# Patient Record
Sex: Male | Born: 1965 | Race: Black or African American | Hispanic: No | Marital: Single | Smoking: Current every day smoker
Health system: Southern US, Community
[De-identification: ages and names within clinical notes are randomized; demographics above are authoritative.]

## PROBLEM LIST (undated history)

## (undated) DIAGNOSIS — F209 Schizophrenia, unspecified: Secondary | ICD-10-CM

---

## 2004-11-26 ENCOUNTER — Emergency Department (HOSPITAL_COMMUNITY): Admission: EM | Admit: 2004-11-26 | Discharge: 2004-11-26 | Payer: Self-pay | Admitting: Emergency Medicine

## 2006-12-21 ENCOUNTER — Emergency Department (HOSPITAL_COMMUNITY): Admission: EM | Admit: 2006-12-21 | Discharge: 2006-12-21 | Payer: Self-pay | Admitting: Emergency Medicine

## 2009-07-17 ENCOUNTER — Emergency Department (HOSPITAL_BASED_OUTPATIENT_CLINIC_OR_DEPARTMENT_OTHER): Admission: EM | Admit: 2009-07-17 | Discharge: 2009-07-17 | Payer: Self-pay | Admitting: Emergency Medicine

## 2009-12-30 ENCOUNTER — Emergency Department (HOSPITAL_COMMUNITY): Admission: EM | Admit: 2009-12-30 | Discharge: 2009-12-31 | Payer: Self-pay | Admitting: Emergency Medicine

## 2010-01-02 ENCOUNTER — Emergency Department (HOSPITAL_BASED_OUTPATIENT_CLINIC_OR_DEPARTMENT_OTHER): Admission: EM | Admit: 2010-01-02 | Discharge: 2010-01-02 | Payer: Self-pay | Admitting: Emergency Medicine

## 2010-04-29 LAB — DIFFERENTIAL
Basophils Relative: 1 % (ref 0–1)
Eosinophils Absolute: 0.6 10*3/uL (ref 0.0–0.7)
Eosinophils Relative: 6 % — ABNORMAL HIGH (ref 0–5)
Lymphocytes Relative: 26 % (ref 12–46)
Monocytes Absolute: 0.7 10*3/uL (ref 0.1–1.0)
Monocytes Relative: 10 % (ref 3–12)
Monocytes Relative: 8 % (ref 3–12)
Neutro Abs: 4.3 10*3/uL (ref 1.7–7.7)
Neutrophils Relative %: 64 % (ref 43–77)

## 2010-04-29 LAB — POCT TOXICOLOGY PANEL

## 2010-04-29 LAB — ETHANOL: Alcohol, Ethyl (B): <5 mg/dL (ref 0–10)

## 2010-04-29 LAB — RAPID URINE DRUG SCREEN, HOSP PERFORMED
Amphetamines: NOT DETECTED
Barbiturates: NOT DETECTED
Cocaine: NOT DETECTED
Opiates: NOT DETECTED
Tetrahydrocannabinol: NOT DETECTED

## 2010-04-29 LAB — CBC
HCT: 36.6 % — ABNORMAL LOW (ref 39.0–52.0)
Hemoglobin: 12.6 g/dL — ABNORMAL LOW (ref 13.0–17.0)
Hemoglobin: 13.4 g/dL (ref 13.0–17.0)
MCH: 31.5 pg (ref 26.0–34.0)
MCHC: 34.5 g/dL (ref 30.0–36.0)
MCV: 91.5 fL (ref 78.0–100.0)
RBC: 4.01 MIL/uL — ABNORMAL LOW (ref 4.22–5.81)
WBC: 7.6 10*3/uL (ref 4.0–10.5)

## 2010-04-29 LAB — BASIC METABOLIC PANEL
CO2: 26 mEq/L (ref 19–32)
Calcium: 9.2 mg/dL (ref 8.4–10.5)
Chloride: 106 mEq/L (ref 96–112)
Creatinine, Ser: 0.8 mg/dL (ref 0.4–1.5)
GFR calc Af Amer: >60 mL/min (ref >60)
GFR calc non Af Amer: >60 mL/min (ref >60)
Glucose, Bld: 106 mg/dL — ABNORMAL HIGH (ref 70–99)
Potassium: 3.4 mEq/L — ABNORMAL LOW (ref 3.5–5.1)
Sodium: 139 mEq/L (ref 135–145)
Sodium: 144 mEq/L (ref 135–145)

## 2010-04-29 LAB — URINALYSIS, ROUTINE W REFLEX MICROSCOPIC
Bilirubin Urine: NEGATIVE
Glucose, UA: NEGATIVE mg/dL
Hgb urine dipstick: NEGATIVE
Ketones, ur: NEGATIVE mg/dL
Protein, ur: NEGATIVE mg/dL
Urobilinogen, UA: 1 mg/dL (ref 0.0–1.0)

## 2010-04-29 LAB — CLOZAPINE (CLOZARIL): Clozapine Lvl: 53 ng/mL

## 2010-04-29 LAB — ACETAMINOPHEN LEVEL: Acetaminophen (Tylenol), Serum: <10 ug/mL — ABNORMAL LOW (ref 10–30)

## 2010-05-05 LAB — URINALYSIS, ROUTINE W REFLEX MICROSCOPIC
Glucose, UA: NEGATIVE mg/dL
Nitrite: NEGATIVE
Specific Gravity, Urine: 1.033 — ABNORMAL HIGH (ref 1.005–1.030)
pH: 6 (ref 5.0–8.0)

## 2010-05-05 LAB — DIFFERENTIAL
Basophils Absolute: 0 10*3/uL (ref 0.0–0.1)
Eosinophils Relative: 7 % — ABNORMAL HIGH (ref 0–5)
Lymphocytes Relative: 32 % (ref 12–46)
Lymphs Abs: 2.3 10*3/uL (ref 0.7–4.0)
Monocytes Absolute: 0.4 10*3/uL (ref 0.1–1.0)
Neutro Abs: 4 10*3/uL (ref 1.7–7.7)

## 2010-05-05 LAB — COMPREHENSIVE METABOLIC PANEL
AST: 30 U/L (ref 0–37)
Albumin: 4 g/dL (ref 3.5–5.2)
BUN: 12 mg/dL (ref 6–23)
Calcium: 9.4 mg/dL (ref 8.4–10.5)
Chloride: 106 mEq/L (ref 96–112)
Creatinine, Ser: 0.8 mg/dL (ref 0.4–1.5)
GFR calc Af Amer: >60 mL/min (ref >60)
GFR calc non Af Amer: >60 mL/min (ref >60)
Total Bilirubin: 0.7 mg/dL (ref 0.3–1.2)

## 2010-05-05 LAB — POCT TOXICOLOGY PANEL

## 2010-05-05 LAB — CBC
HCT: 40.8 % (ref 39.0–52.0)
MCHC: 32.8 g/dL (ref 30.0–36.0)
MCV: 92.3 fL (ref 78.0–100.0)
Platelets: 280 10*3/uL (ref 150–400)

## 2010-08-14 ENCOUNTER — Emergency Department (HOSPITAL_COMMUNITY)
Admission: EM | Admit: 2010-08-14 | Discharge: 2010-08-14 | Disposition: A | Discharge status: Home / Self Care | Payer: Medicaid Other | Attending: Emergency Medicine | Admitting: Emergency Medicine

## 2010-08-14 DIAGNOSIS — I10 Essential (primary) hypertension: Secondary | ICD-10-CM | POA: Insufficient documentation

## 2010-08-14 DIAGNOSIS — Z87820 Personal history of traumatic brain injury: Secondary | ICD-10-CM | POA: Insufficient documentation

## 2010-08-14 DIAGNOSIS — F411 Generalized anxiety disorder: Secondary | ICD-10-CM | POA: Insufficient documentation

## 2010-11-11 ENCOUNTER — Emergency Department (HOSPITAL_COMMUNITY)
Admission: EM | Admit: 2010-11-11 | Discharge: 2010-11-11 | Disposition: A | Discharge status: Other Acute Inpatient Hospital | Payer: Medicaid Other | Attending: Emergency Medicine | Admitting: Emergency Medicine

## 2010-11-11 DIAGNOSIS — F259 Schizoaffective disorder, unspecified: Secondary | ICD-10-CM | POA: Insufficient documentation

## 2010-11-11 DIAGNOSIS — F489 Nonpsychotic mental disorder, unspecified: Secondary | ICD-10-CM | POA: Insufficient documentation

## 2010-11-11 DIAGNOSIS — I10 Essential (primary) hypertension: Secondary | ICD-10-CM | POA: Insufficient documentation

## 2010-11-11 DIAGNOSIS — Z87820 Personal history of traumatic brain injury: Secondary | ICD-10-CM | POA: Insufficient documentation

## 2010-11-11 DIAGNOSIS — R45850 Homicidal ideations: Secondary | ICD-10-CM | POA: Insufficient documentation

## 2010-11-11 LAB — COMPREHENSIVE METABOLIC PANEL
ALT: 22 U/L (ref 0–53)
AST: 25 U/L (ref 0–37)
Albumin: 4 g/dL (ref 3.5–5.2)
Chloride: 100 mEq/L (ref 96–112)
Creatinine, Ser: 0.57 mg/dL (ref 0.50–1.35)
Sodium: 134 mEq/L — ABNORMAL LOW (ref 135–145)
Total Bilirubin: 0.4 mg/dL (ref 0.3–1.2)

## 2010-11-11 LAB — DIFFERENTIAL
Basophils Absolute: 0 10*3/uL (ref 0.0–0.1)
Eosinophils Relative: 5 % (ref 0–5)
Lymphocytes Relative: 23 % (ref 12–46)
Lymphs Abs: 2 10*3/uL (ref 0.7–4.0)
Monocytes Absolute: 0.8 10*3/uL (ref 0.1–1.0)
Monocytes Relative: 8 % (ref 3–12)
Neutro Abs: 5.7 10*3/uL (ref 1.7–7.7)

## 2010-11-11 LAB — CBC
HCT: 41.1 % (ref 39.0–52.0)
Hemoglobin: 14.4 g/dL (ref 13.0–17.0)
MCH: 31.2 pg (ref 26.0–34.0)
MCHC: 35 g/dL (ref 30.0–36.0)
MCV: 89.2 fL (ref 78.0–100.0)

## 2010-11-11 LAB — ETHANOL: Alcohol, Ethyl (B): <11 mg/dL (ref 0–11)

## 2010-11-11 LAB — RAPID URINE DRUG SCREEN, HOSP PERFORMED
Barbiturates: NOT DETECTED
Cocaine: NOT DETECTED
Tetrahydrocannabinol: POSITIVE — AB

## 2010-11-25 LAB — COMPREHENSIVE METABOLIC PANEL
ALT: 46
AST: 77 — ABNORMAL HIGH
Alkaline Phosphatase: 75
CO2: 27
Chloride: 102
Creatinine, Ser: 0.71
GFR calc Af Amer: >60
GFR calc non Af Amer: >60
Total Bilirubin: 0.9

## 2010-11-25 LAB — URINALYSIS, ROUTINE W REFLEX MICROSCOPIC
Nitrite: NEGATIVE
Specific Gravity, Urine: 1.042 — ABNORMAL HIGH
Urobilinogen, UA: 1

## 2010-11-25 LAB — DIFFERENTIAL
Basophils Absolute: 0
Basophils Relative: 0
Eosinophils Absolute: 0.3
Eosinophils Relative: 3

## 2010-11-25 LAB — CBC
MCV: 89.6
RBC: 3.83 — ABNORMAL LOW
WBC: 10

## 2010-11-25 LAB — URINE MICROSCOPIC-ADD ON: Urine-Other: NONE SEEN

## 2010-11-25 LAB — RAPID URINE DRUG SCREEN, HOSP PERFORMED
Amphetamines: NOT DETECTED
Barbiturates: NOT DETECTED

## 2011-12-23 ENCOUNTER — Emergency Department (HOSPITAL_COMMUNITY)
Admission: EM | Admit: 2011-12-23 | Discharge: 2011-12-23 | Discharge status: Against Medical Advice | Payer: Self-pay | Attending: Emergency Medicine | Admitting: Emergency Medicine

## 2011-12-23 DIAGNOSIS — Z7689 Persons encountering health services in other specified circumstances: Secondary | ICD-10-CM | POA: Insufficient documentation

## 2011-12-23 NOTE — ED Notes (Signed)
Patient called for triage. No answer. Patient not in waiting room or outside.

## 2011-12-23 NOTE — ED Notes (Signed)
Patient called for triage. No answer. Patient not in waiting room and not found outside.

## 2011-12-23 NOTE — ED Notes (Signed)
Patient called for triage. No answer. Patient not found in waiting area or outside.

## 2012-03-24 ENCOUNTER — Emergency Department (HOSPITAL_COMMUNITY)
Admission: EM | Admit: 2012-03-24 | Discharge: 2012-03-24 | Disposition: A | Discharge status: Home / Self Care | Payer: Medicaid Other | Attending: Emergency Medicine | Admitting: Emergency Medicine

## 2012-03-24 ENCOUNTER — Emergency Department (HOSPITAL_COMMUNITY): Payer: Medicaid Other

## 2012-03-24 DIAGNOSIS — M795 Residual foreign body in soft tissue: Secondary | ICD-10-CM

## 2012-03-24 DIAGNOSIS — S0101XA Laceration without foreign body of scalp, initial encounter: Secondary | ICD-10-CM

## 2012-03-24 DIAGNOSIS — R799 Abnormal finding of blood chemistry, unspecified: Secondary | ICD-10-CM | POA: Insufficient documentation

## 2012-03-24 DIAGNOSIS — S0100XA Unspecified open wound of scalp, initial encounter: Secondary | ICD-10-CM

## 2012-03-24 DIAGNOSIS — F29 Unspecified psychosis not due to a substance or known physiological condition: Secondary | ICD-10-CM | POA: Insufficient documentation

## 2012-03-24 LAB — CBC
HCT: 40.5 % (ref 39.0–52.0)
Hemoglobin: 13.5 g/dL (ref 13.0–17.0)
MCH: 29.5 pg (ref 26.0–34.0)
MCV: 88.6 fL (ref 78.0–100.0)
Platelets: 163 10*3/uL (ref 150–400)
RBC: 4.57 MIL/uL (ref 4.22–5.81)
WBC: 12.4 10*3/uL — ABNORMAL HIGH (ref 4.0–10.5)

## 2012-03-24 LAB — ABO/RH: ABO/RH(D): O NEG

## 2012-03-24 LAB — POCT I-STAT, CHEM 8
BUN: 11 mg/dL (ref 6–23)
Chloride: 106 mEq/L (ref 96–112)
Creatinine, Ser: 1 mg/dL (ref 0.50–1.35)
Glucose, Bld: 130 mg/dL — ABNORMAL HIGH (ref 70–99)
Potassium: 4.7 mEq/L (ref 3.5–5.1)
Sodium: 144 mEq/L (ref 135–145)

## 2012-03-24 LAB — COMPREHENSIVE METABOLIC PANEL
AST: 49 U/L — ABNORMAL HIGH (ref 0–37)
Albumin: 3.7 g/dL (ref 3.5–5.2)
Chloride: 104 mEq/L (ref 96–112)
Creatinine, Ser: 0.8 mg/dL (ref 0.50–1.35)
Potassium: 4.8 mEq/L (ref 3.5–5.1)
Total Bilirubin: 0.2 mg/dL — ABNORMAL LOW (ref 0.3–1.2)
Total Protein: 7.1 g/dL (ref 6.0–8.3)

## 2012-03-24 LAB — CDS SEROLOGY

## 2012-03-24 MED ORDER — FENTANYL CITRATE 0.05 MG/ML IJ SOLN
INTRAMUSCULAR | Status: AC | PRN
  Administered 2012-03-24: 50 ug via INTRAVENOUS

## 2012-03-24 MED ORDER — CEFAZOLIN SODIUM-DEXTROSE 2-3 GM-% IV SOLR: 2.0000 g | Freq: Once | INTRAVENOUS | Status: DC

## 2012-03-24 MED ORDER — CEFAZOLIN SODIUM 1 G IJ SOLR
2.0000 g | Freq: Once | INTRAMUSCULAR | Status: AC
  Administered 2012-03-24: 2 g via INTRAMUSCULAR
  Filled 2012-03-24 (×2): qty 20
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
## 2012-03-24 NOTE — ED Notes (Signed)
PT transported to CT with Nehemiah Settle, Charity fundraiser
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
## 2012-03-24 NOTE — ED Notes (Signed)
Pt shouting and cursing at staff and [employer]. [employer] at bedside. Pts brother called at pt request by SW. Brother will pick pt up for d/c.
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
## 2012-03-24 NOTE — ED Notes (Signed)
Pt removed IV to Rt AC. Bleeding controlled

## 2012-03-24 NOTE — ED Notes (Signed)
Pt removed all monitors. Refuses to allow RN to replace and obtain VS. MD notified. Trauma signing off, ED-P will resume care.
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
## 2012-03-24 NOTE — ED Notes (Signed)
Lactic acid results shown to Dr. Preston Fleeting

## 2012-03-24 NOTE — Consult Note (Signed)
Patient examined and I agree with the assessment and plan  Violeta Gelinas, MD, MPH, FACS Pager: [phone]  03/24/2012 3:56 PM
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
## 2012-03-24 NOTE — ED Notes (Signed)
Pt refused to have VS taken at time of d/c.

## 2012-03-25 LAB — TYPE AND SCREEN
ABO/RH(D): O NEG
Unit division: 0

## 2012-03-26 ENCOUNTER — Emergency Department (HOSPITAL_COMMUNITY)
Admission: EM | Admit: 2012-03-26 | Discharge: 2012-03-28 | Disposition: A | Discharge status: Home / Self Care | Payer: Medicaid Other | Source: Home / Self Care | Attending: Emergency Medicine | Admitting: Emergency Medicine

## 2012-03-26 ENCOUNTER — Emergency Department (HOSPITAL_COMMUNITY): Payer: Medicaid Other

## 2012-03-26 ENCOUNTER — Encounter (HOSPITAL_COMMUNITY): Payer: Self-pay | Admitting: *Deleted

## 2012-03-26 DIAGNOSIS — F17200 Nicotine dependence, unspecified, uncomplicated: Secondary | ICD-10-CM | POA: Insufficient documentation

## 2012-03-26 DIAGNOSIS — F209 Schizophrenia, unspecified: Secondary | ICD-10-CM

## 2012-03-26 DIAGNOSIS — Z9119 Patient's noncompliance with other medical treatment and regimen: Secondary | ICD-10-CM

## 2012-03-26 DIAGNOSIS — Z91199 Patient's noncompliance with other medical treatment and regimen due to unspecified reason: Secondary | ICD-10-CM

## 2012-03-26 DIAGNOSIS — F1410 Cocaine abuse, uncomplicated: Secondary | ICD-10-CM | POA: Diagnosis present

## 2012-03-26 DIAGNOSIS — Z79899 Other long term (current) drug therapy: Secondary | ICD-10-CM | POA: Insufficient documentation

## 2012-03-26 HISTORY — DX: Schizophrenia, unspecified: F20.9

## 2012-03-26 LAB — COMPREHENSIVE METABOLIC PANEL
ALT: 44 U/L (ref 0–53)
AST: 67 U/L — ABNORMAL HIGH (ref 0–37)
Albumin: 3.7 g/dL (ref 3.5–5.2)
CO2: 26 mEq/L (ref 19–32)
Calcium: 9.1 mg/dL (ref 8.4–10.5)
Chloride: 103 mEq/L (ref 96–112)
GFR calc non Af Amer: >90 mL/min (ref >90)
Sodium: 138 mEq/L (ref 135–145)
Total Bilirubin: 0.2 mg/dL — ABNORMAL LOW (ref 0.3–1.2)

## 2012-03-26 LAB — CBC WITH DIFFERENTIAL/PLATELET
Basophils Absolute: 0 10*3/uL (ref 0.0–0.1)
Basophils Relative: 0 % (ref 0–1)
Lymphocytes Relative: 25 % (ref 12–46)
Neutro Abs: 7.1 10*3/uL (ref 1.7–7.7)
Platelets: 263 10*3/uL (ref 150–400)
RDW: 14.8 % (ref 11.5–15.5)
WBC: 11.2 10*3/uL — ABNORMAL HIGH (ref 4.0–10.5)

## 2012-03-26 LAB — ETHANOL: Alcohol, Ethyl (B): <11 mg/dL (ref 0–11)

## 2012-03-26 MED ORDER — ZIPRASIDONE MESYLATE 20 MG IM SOLR
20.0000 mg | Freq: Once | INTRAMUSCULAR | Status: AC
  Administered 2012-03-26: 20 mg via INTRAMUSCULAR
  Filled 2012-03-26: qty 20

## 2012-03-26 MED ORDER — HALOPERIDOL LACTATE 5 MG/ML IJ SOLN
INTRAMUSCULAR | Status: AC
  Filled 2012-03-26: qty 1

## 2012-03-26 MED ORDER — LORAZEPAM 2 MG/ML IJ SOLN
2.0000 mg | Freq: Once | INTRAMUSCULAR | Status: AC
  Administered 2012-03-26: 2 mg via INTRAVENOUS

## 2012-03-26 MED ORDER — ACETAMINOPHEN 325 MG PO TABS: 650.0000 mg | ORAL_TABLET | ORAL | Status: DC | PRN

## 2012-03-26 MED ORDER — LORAZEPAM 2 MG/ML IJ SOLN
INTRAMUSCULAR | Status: AC
  Administered 2012-03-26: 2 mg via INTRAVENOUS
  Filled 2012-03-26: qty 1

## 2012-03-26 MED ORDER — IBUPROFEN 600 MG PO TABS: 600.0000 mg | ORAL_TABLET | Freq: Three times a day (TID) | ORAL | Status: DC | PRN

## 2012-03-26 MED ORDER — LORAZEPAM 1 MG PO TABS
1.0000 mg | ORAL_TABLET | Freq: Three times a day (TID) | ORAL | Status: DC | PRN
  Administered 2012-03-27 – 2012-03-28 (×3): 1 mg via ORAL
  Filled 2012-03-26 (×3): qty 1

## 2012-03-26 MED ORDER — ONDANSETRON HCL 4 MG PO TABS: 4.0000 mg | ORAL_TABLET | Freq: Three times a day (TID) | ORAL | Status: DC | PRN

## 2012-03-26 NOTE — ED Notes (Signed)
Patient transported to CT 

## 2012-03-26 NOTE — ED Notes (Signed)
Pt much calmer at this time. Pt resting quietly. Respirations even and unlabored.

## 2012-03-26 NOTE — ED Notes (Signed)
Pt extremely agitated and having flight of ideas and word salad. Pt shaking his fist, yelling, and swinging his arms. [employer] and security at bedside. IV ativan given per Dr. Wonda Amis order. Pt placed on monitor.
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
## 2012-03-26 NOTE — ED Notes (Signed)
Dr. Bevely Palmer aware and will be in to evaluate pt. To do IVC.
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
## 2012-03-27 LAB — TYPE AND SCREEN: Antibody Screen: NEGATIVE

## 2012-03-27 LAB — URINALYSIS, ROUTINE W REFLEX MICROSCOPIC
Bilirubin Urine: NEGATIVE
Glucose, UA: NEGATIVE mg/dL
Hgb urine dipstick: NEGATIVE
Specific Gravity, Urine: 1.024 (ref 1.005–1.030)
Urobilinogen, UA: 1 mg/dL (ref 0.0–1.0)

## 2012-03-27 LAB — CBC
HCT: 28.3 % — ABNORMAL LOW (ref 39.0–52.0)
Hemoglobin: 9.7 g/dL — ABNORMAL LOW (ref 13.0–17.0)
MCHC: 34.3 g/dL (ref 30.0–36.0)
RBC: 3.23 MIL/uL — ABNORMAL LOW (ref 4.22–5.81)
WBC: 8.6 10*3/uL (ref 4.0–10.5)

## 2012-03-27 LAB — RAPID URINE DRUG SCREEN, HOSP PERFORMED
Amphetamines: NOT DETECTED
Benzodiazepines: NOT DETECTED
Opiates: NOT DETECTED
Tetrahydrocannabinol: POSITIVE — AB

## 2012-03-27 LAB — PROTIME-INR: INR: 1.03 (ref 0.00–1.49)

## 2012-03-27 MED ORDER — DIVALPROEX SODIUM 500 MG PO DR TAB
500.0000 mg | DELAYED_RELEASE_TABLET | Freq: Two times a day (BID) | ORAL | Status: DC
  Administered 2012-03-27 – 2012-03-28 (×3): 500 mg via ORAL
  Filled 2012-03-27 (×3): qty 1

## 2012-03-27 MED ORDER — RISPERIDONE 2 MG PO TABS
2.0000 mg | ORAL_TABLET | Freq: Two times a day (BID) | ORAL | Status: DC
  Filled 2012-03-27: qty 1

## 2012-03-27 NOTE — ED Notes (Signed)
This Clinical research associate spoke w/ the patients brother Daisey Must [phone] (at the pt's request and with his permission).  The brother reports that [facility] manage the pt's medications and that  he was suppose to receive this weeks medications on Monday but did not get them because he was at the hospital, so he has not been taking them this week.  The brother did not know what the patients medications are.
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
## 2012-03-27 NOTE — ED Notes (Signed)
Patient displaying sexually inappropriate behaviors as I was obtaining vital signs. Patient conversation with me was not inappropriate, just his behavior. RN Wille Celeste notified

## 2012-03-27 NOTE — ED Notes (Signed)
eatting lunch, pt is aware that telepsych will be done shortly

## 2012-03-27 NOTE — ED Provider Notes (Signed)
Patient had telemetry psychiatry consult for medication recommendations and those have been entered in the computer  [name], MD 03/27/12 [id number]

## 2012-03-27 NOTE — BH Assessment (Signed)
[facility] Assessment Progress Note   Consulted with Assunta Found, FNP regarding pt.  She called Dr Jeraldine Loots, EDP, to obtain further medical details.  Per [place], Dr Jeraldine Loots reported that no wound care would be necessary beyond keeping the wound clean and monitoring it for signs of infection.  Pt is to follow up with outpatient surgery; this being the case, there should be no further risk of re-injury from the foreign object remaining under the wound.  After consulting with Assunta Found, FNP it has been determined that pt would benefit from admission to [facility].  He is tentatively accepted pending availability of a bed on the 400 hall.  Currently none are available.  Called and spoke to Scherrie Merritts, LCSW, Assessment Counselor @ 18:30 to notify him.  Doylene Canning, MA Assessment Counselor 03/27/2012 @ 18:42

## 2012-03-27 NOTE — BH Assessment (Signed)
[facility] Assessment Progress Note   This clinician attempted to assess patient at 00:56.  Patient was easy to wake but would not maintain alertness.  Patient mumbles and is very difficult to understand.  Patient unable to answer basic questions at this time so another attempt to assess will be done before 07:00.  Spoke to Dr. Effie Shy and nurse Melody about holding off on telepsychiatry consult until patient is more alert and oriented.  Dr. Effie Shy said that was fine.

## 2012-03-27 NOTE — ED Notes (Signed)
AD contacted regarding need of sitter.

## 2012-03-27 NOTE — ED Provider Notes (Signed)
Patient transferred here from [facility] to await placement  [name], MD 03/27/12 [phone]

## 2012-03-27 NOTE — ED Notes (Signed)
Up to the bathroom 

## 2012-03-27 NOTE — ED Notes (Signed)
telepsych in progress 

## 2012-03-27 NOTE — ED Provider Notes (Signed)
Patient is stable for transfer to [facility] long psych ED.  BP 119/63  Pulse 89  Temp(Src) 98.3 F (36.8 C) (Oral)  Resp 18  SpO2 100%   Glynn Octave, MD 03/27/12 [phone]

## 2012-03-27 NOTE — ED Notes (Signed)
Up to the desk on the phone 

## 2012-03-27 NOTE — ED Notes (Signed)
Pt approached other nurse and asked her about activities in unit and then began talking to her about gangsters, sex and the internet.
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
## 2012-03-27 NOTE — ED Notes (Signed)
telepsych info faxed and called 

## 2012-03-27 NOTE — ED Notes (Signed)
Attempted to call [facility] for medication list, unable to get an answer.

## 2012-03-27 NOTE — ED Notes (Signed)
In to see pt.  Clothes and belonging removed from pt.  In process of doing inventory on belongings

## 2012-03-27 NOTE — ED Notes (Addendum)
ivc papers delivered by [facility] and placed on chart.  Berna Spare with act in to access the pt and states he feels at this time he is not able to speak with telepsych md.

## 2012-03-28 ENCOUNTER — Encounter (HOSPITAL_COMMUNITY): Payer: Self-pay | Admitting: Behavioral Health

## 2012-03-28 ENCOUNTER — Inpatient Hospital Stay (HOSPITAL_COMMUNITY)
Admission: EM | Admit: 2012-03-28 | Discharge: 2012-04-04 | DRG: 885 | Disposition: A | Discharge status: Home / Self Care | Payer: Medicaid Other | Source: Intra-hospital | Attending: Psychiatry | Admitting: Psychiatry

## 2012-03-28 DIAGNOSIS — F209 Schizophrenia, unspecified: Secondary | ICD-10-CM

## 2012-03-28 DIAGNOSIS — Z9119 Patient's noncompliance with other medical treatment and regimen: Secondary | ICD-10-CM

## 2012-03-28 DIAGNOSIS — F1910 Other psychoactive substance abuse, uncomplicated: Secondary | ICD-10-CM

## 2012-03-28 MED ORDER — ACETAMINOPHEN 325 MG PO TABS: 650.0000 mg | ORAL_TABLET | Freq: Four times a day (QID) | ORAL | Status: DC | PRN

## 2012-03-28 MED ORDER — LISINOPRIL 10 MG PO TABS
10.0000 mg | ORAL_TABLET | Freq: Every day | ORAL | Status: DC
  Administered 2012-03-31 – 2012-04-04 (×4): 10 mg via ORAL
  Filled 2012-03-28 (×2): qty 1
  Filled 2012-03-28: qty 14
  Filled 2012-03-28 (×7): qty 1

## 2012-03-28 MED ORDER — TRAZODONE HCL 50 MG PO TABS
50.0000 mg | ORAL_TABLET | Freq: Every day | ORAL | Status: DC
  Administered 2012-03-29 – 2012-04-03 (×6): 50 mg via ORAL
  Filled 2012-03-28: qty 1
  Filled 2012-03-28: qty 14
  Filled 2012-03-28 (×8): qty 1

## 2012-03-28 MED ORDER — MAGNESIUM HYDROXIDE 400 MG/5ML PO SUSP: 30.0000 mL | Freq: Every day | ORAL | Status: DC | PRN

## 2012-03-28 MED ORDER — ALUM & MAG HYDROXIDE-SIMETH 200-200-20 MG/5ML PO SUSP: 30.0000 mL | ORAL | Status: DC | PRN

## 2012-03-28 MED ORDER — LORAZEPAM 1 MG PO TABS
1.0000 mg | ORAL_TABLET | Freq: Three times a day (TID) | ORAL | Status: DC | PRN
  Administered 2012-03-28 – 2012-03-29 (×2): 1 mg via ORAL
  Filled 2012-03-28 (×2): qty 1

## 2012-03-28 MED ORDER — OLANZAPINE 10 MG PO TBDP
10.0000 mg | ORAL_TABLET | Freq: Every day | ORAL | Status: DC
  Filled 2012-03-28 (×3): qty 1

## 2012-03-28 NOTE — Progress Notes (Signed)
Adult Psychoeducational Group Note  Date:  03/28/2012 Time:  2005  Group Topic/Focus:  Wrap-Up Group:   The focus of this group is to help patients review their daily goal of treatment and discuss progress on daily workbooks.  Participation Level:  None  Participation Quality:  None  Affect:  Blunted and Irritable  Cognitive:  Disoriented  Insight: None  Engagement in Group:  Off Topic  Modes of Intervention:  Discussion and Support  Additional Comments:  Pt. didn't participate in group  Gwenevere Ghazi Patience 03/28/2012, 10:59 PM

## 2012-03-28 NOTE — Clinical Social Work Note (Signed)
CSW faxed information to: [place] [phone] (p) [phone]) [place] [phone]) [phone]) [facility] [phone](p) [phone])  Vickii Penna, LCSWA [phone]  Clinical Social Work

## 2012-03-28 NOTE — Progress Notes (Signed)
Patient ID: Gregory Meza, male   DOB: [birth year], 47 y.o.   MRN: [id number] Admission note: pt denies SI/HI/AVH. Pt is very anxious and presents with involuntary upper extremity movement. Pt is uncooperative and refused skin assessment. AC was notified and was able to assessment pt skin. Pt told writer and [facility] that he will no longer allow staff to take his B/P nor take B/P meds. Pt refused to sign all documentation. Pt belongings search, and policies here at [facility] explained to pt. Pt brought back onto the unit and given a tour. Pt cooperative during f/u admission process with Clinical research associate. Pt reported that he uses crack cocaine and marijuana. Pt reported that he drinks alcohol occasionally. Pt is now cooperative. Pt remains safe on the unit.   Pt refused b/p med when offered by Clinical research associate.

## 2012-03-28 NOTE — ED Notes (Signed)
Patient asleep; no s/s of distress noted. 

## 2012-03-28 NOTE — ED Notes (Signed)
Pt asleep; no s/s of distress noted.

## 2012-03-28 NOTE — Clinical Social Work Note (Addendum)
CSW faxed paperwork to [facility] and gave pt BH packet to RN.  Pt accepted at [facility] by [place], [place] to [place] to [phone].  CSW completed [facility] support paperwork and made EDP and RN aware.  Vickii Penna, LCSWA [phone]  Clinical Social Work

## 2012-03-28 NOTE — Tx Team (Signed)
Initial Interdisciplinary Treatment Plan  PATIENT STRENGTHS: (choose at least two) Active sense of humor Supportive family/friends  PATIENT STRESSORS: Financial difficulties Substance abuse   PROBLEM LIST: Problem List/Patient Goals Date to be addressed Date deferred Reason deferred Estimated date of resolution  Psychosis  03/28/2012     Substance abuse 03/28/2012                                                DISCHARGE CRITERIA:  Ability to meet basic life and health needs Improved stabilization in mood, thinking, and/or behavior Verbal commitment to aftercare and medication compliance  PRELIMINARY DISCHARGE PLAN: Attend aftercare/continuing care group Attend PHP/IOP  PATIENT/FAMIILY INVOLVEMENT: This treatment plan has been presented to and reviewed with the patient, Gregory Meza, and/or family member. The patient and family have been given the opportunity to ask questions and make suggestions.  Marcella Dunnaway L 03/28/2012, 7:11 PM

## 2012-03-29 DIAGNOSIS — F1910 Other psychoactive substance abuse, uncomplicated: Secondary | ICD-10-CM | POA: Diagnosis present

## 2012-03-29 DIAGNOSIS — Z9119 Patient's noncompliance with other medical treatment and regimen: Secondary | ICD-10-CM

## 2012-03-29 DIAGNOSIS — F209 Schizophrenia, unspecified: Principal | ICD-10-CM

## 2012-03-29 MED ORDER — DIPHENHYDRAMINE HCL 50 MG/ML IJ SOLN
50.0000 mg | Freq: Once | INTRAMUSCULAR | Status: AC
  Administered 2012-03-29: 50 mg via INTRAMUSCULAR
  Filled 2012-03-29 (×2): qty 1

## 2012-03-29 MED ORDER — LORAZEPAM 2 MG/ML IJ SOLN: 2.0000 mg | Freq: Once | INTRAMUSCULAR | Status: DC

## 2012-03-29 MED ORDER — OLANZAPINE 10 MG IM SOLR
10.0000 mg | Freq: Once | INTRAMUSCULAR | Status: AC
  Administered 2012-03-29: 10 mg via INTRAMUSCULAR
  Filled 2012-03-29 (×2): qty 10

## 2012-03-29 MED ORDER — FLUPHENAZINE HCL 5 MG PO TABS
15.0000 mg | ORAL_TABLET | ORAL | Status: DC
  Administered 2012-03-30 – 2012-03-31 (×2): 15 mg via ORAL
  Filled 2012-03-29 (×4): qty 1

## 2012-03-29 MED ORDER — GABAPENTIN 600 MG PO TABS
600.0000 mg | ORAL_TABLET | Freq: Two times a day (BID) | ORAL | Status: DC
  Administered 2012-03-29 – 2012-04-04 (×12): 600 mg via ORAL
  Filled 2012-03-29 (×2): qty 1
  Filled 2012-03-29: qty 28
  Filled 2012-03-29 (×6): qty 1
  Filled 2012-03-29: qty 28
  Filled 2012-03-29 (×6): qty 1

## 2012-03-29 MED ORDER — METOPROLOL TARTRATE 25 MG PO TABS
12.5000 mg | ORAL_TABLET | Freq: Two times a day (BID) | ORAL | Status: DC
  Administered 2012-03-29 – 2012-04-04 (×11): 12.5 mg via ORAL
  Filled 2012-03-29 (×11): qty 1
  Filled 2012-03-29: qty 14
  Filled 2012-03-29 (×3): qty 1
  Filled 2012-03-29: qty 14

## 2012-03-29 MED ORDER — OLANZAPINE 10 MG PO TABS
20.0000 mg | ORAL_TABLET | Freq: Two times a day (BID) | ORAL | Status: DC
  Administered 2012-03-29 – 2012-03-31 (×4): 20 mg via ORAL
  Filled 2012-03-29 (×9): qty 2

## 2012-03-29 MED ORDER — FLUPHENAZINE HCL 10 MG PO TABS
20.0000 mg | ORAL_TABLET | Freq: Every day | ORAL | Status: DC
  Administered 2012-03-29 – 2012-03-30 (×2): 20 mg via ORAL
  Filled 2012-03-29 (×4): qty 2

## 2012-03-29 NOTE — Progress Notes (Signed)
RN 1:1 note.   D:  Patient continues to be loud, demanding, and intrusive.  His speech is pressured and tangential.  He continues to be disrespectful and to use vulgar and abusive language.  He has been demanding personal items from his locker.  A:  Patient continues on one to one for being sexually inappropriate on the unit with staff and peers.  I explained to patient that once a locker is locked, it stays that way and we would provide for his needs while he was in the hospital.  R:  Very loud and argumentative.  Remains safe on the unit at this time.

## 2012-03-29 NOTE — Treatment Plan (Signed)
Interdisciplinary Treatment Plan Update (Adult)  Date: 03/29/2012  Time Reviewed: 10:58 AM   Progress in Treatment: Attending groups: Yes Participating in groups: In a disorganized fashion Taking medication as prescribed: Yes Tolerating medication: Yes   Family/Significant other contact made:  No Patient understands diagnosis:  No Limited insight Discussing patient identified problems/goals with staff:  Yes  See below Medical problems stabilized or resolved:  Yes Denies suicidal/homicidal ideation: Yes  In tx team Issues/concerns per patient self-inventory:  Not filled out Other:  New problem(s) identified: N/A  Reason for Continuation of Hospitalization: Hallucinations Medication stabilization Other; describe Labile mood  Interventions implemented related to continuation of hospitalization: Prolixin, Zyprexa, Neurontin, Trazodone cocktail   Encourage group attendance and participation  1:1 staffing for labile mood  Additional comments:  Estimated length of stay: 5 days  Discharge Plan: return home, follow up with [facility] ACT team  New goal(s): N/A  Review of initial/current patient goals per problem list:   1.  Goal(s):Eliminate psychosis with the use of medication/therapeutic milieu  Met:  No  Target date:2/14  As evidenced by: [name] is currently disorganized with tangential and circumstantial thought patterns  2.  Goal (s):Stabilize mood with the use of medication/therapeutic milieu  Met:  No  Target date:2/14  As evidenced XB:[id number] mood is labile.  He is easily agitated and angered  3.  Goal(s): Coordinate services with [facility] ACT team  Met:  No  Target date:2/14  As evidenced by: phone call; meeting in person  4.  Goal(s):  Met:  No  Target date:  As evidenced by:  Attendees: Patient:     Family:     Physician:  Thedore Mins 03/29/2012 10:58 AM   Nursing:  Joslyn Devon  03/29/2012 10:58 AM   Clinical Social  Worker:  Richelle Ito 03/29/2012 10:58 AM   Extender:  Verne Spurr PA 03/29/2012 10:58 AM   Other:     Other:     Other:     Other:      Scribe for Treatment Team:   Ida Rogue, 03/29/2012 10:58 AM

## 2012-03-29 NOTE — [facility] Suicide Risk Assessment (Signed)
Suicide Risk Assessment  Admission Assessment     Nursing information obtained from:  Patient Demographic factors:  Male;Low socioeconomic status Current Mental Status:  NA Loss Factors:  Financial problems / change in socioeconomic status Historical Factors:  Impulsivity Risk Reduction Factors:  Positive social support  CLINICAL FACTORS:   Severe Anxiety and/or Agitation Depression:   Aggression Impulsivity  COGNITIVE FEATURES THAT CONTRIBUTE TO RISK:  Closed-mindedness Polarized thinking    SUICIDE RISK:   Minimal: No identifiable suicidal ideation.  Patients presenting with no risk factors but with morbid ruminations; may be classified as minimal risk based on the severity of the depressive symptoms  PLAN OF CARE:1. Admit for crisis management and stabilization. 2. Medication management to reduce current symptoms to base line and improve the patient's overall level of functioning 3. Treat health problems as indicated. 4. Develop treatment plan to decrease risk of relapse upon discharge and the need for readmission. 5. Psycho-social education regarding relapse prevention and self care. 6. Health care follow up as needed for medical problems. 7. Restart home medications where appropriate.   I certify that inpatient services furnished can reasonably be expected to improve the patient's condition.  Eloina Ergle,MD 03/29/2012, 11:13 AM

## 2012-03-29 NOTE — Progress Notes (Signed)
RN 1:1 note R:  Patient up and in the dayroom.  Quieter and less offensive with his language at this time.  Took his scheduled medications at suppertime without incident.  A:  Medications offered.  Spoke with patient one on one.  Offered reassurance and encouragement.  R:  Patient more cooperative and more appropriate with his interactions at this time.  Remains safe on the unit.

## 2012-03-29 NOTE — Progress Notes (Signed)
Psychoeducational Group Note  Date:  03/29/2012 Time:  2000  Group Topic/Focus:  Wrap-Up Group:   The focus of this group is to help patients review their daily goal of treatment and discuss progress on daily workbooks.  Participation Level: Did Not Attend  Participation Quality:  Not Applicable  Affect:  Not Applicable  Cognitive:  Not Applicable  Insight:  Not Applicable  Engagement in Group: Not Applicable  Additional Comments:  Pt did not attend.  Gregory Meza 03/29/2012, 9:05 PM  
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
## 2012-03-29 NOTE — Progress Notes (Signed)
D: Pt remained isolated/sleeping in room for much of evening but did get up to attend wrapup group.  In 1:1, stated "I feel all right".  Explained that his goal is to go back to school to "study all of it".  Eye contact brief with angry facial expression.  Affect/mood anxious/angry/suspicious.  Speech aggressive, loud, and argumentative and Pt required multiple redirections at med windows for intrusiveness during other Pts' med passes.  Pt became very demanding at times, for example wanting to be allowed to the front of the med line "because", rising to the level of verbal abuse of staff with no discernible trigger..  Overall, during relatively short period that Pt spent out on unit he was agitated, irritable, paranoid, and disorganized even while denying AVH.  Judgment is limited and Pt is mildly confused and requires occasional redirection with tasks.  Continues to refuse to allow VS to be taken.  Refused HS Trazodone.  Denies SI/HI, AVH, and acute pain.  Contracting for safety on unit.  A: Pt given quiet verbal support and redirection to allow him to acclimate to unit.  Although refused HS Trazodone, was offered and accepted PRN Ativan 1mg  PO @2144  with good effect.  Medication administered according to med orders and initial POC.  Q15 minute safety checks maintained as per unit protocol.  R: Pt agitated with difficult adjustment to unit.  Safety maintained. Dion Saucier RN

## 2012-03-29 NOTE — Clinical Social Work Note (Signed)
[facility] LCSW Group Therapy  03/29/2012 , 3:50 PM   Type of Therapy:  Group Therapy  Participation Level:  Active  Participation Quality:  Disruptive  Affect:  Labilie  Cognitive:  Alert  Insight:  INone  Engagement in Therapy:  None  Modes of Intervention:  Discussion, Exploration and Socialization  Summary of Progress/Problems: Today's group focused on the term Diagnosis.  Participants were asked to define the term, and then pronounce whether it is a negative, positive or neutral term.  [name] became increasingly loud and disruptive during the group to the extent that he stormed out of the room.  The noise increased in the hall way.  He eventually returned, and was on 1:1 staffing at that point.  He was more subdued, and able to be redirected several times by his 1:1.  He was not able to engage in the group discussion in any meaningful way.  Gregory Meza 03/29/2012 , 3:50 PM

## 2012-03-29 NOTE — Progress Notes (Addendum)
[facility]  1:1 Observation Documentation  Nursing 1:1 note  D:Pt observed at medication window, then in the day room. RR even and unlabored. No distress noted. Pt behavior has been appropriate. Pt questions taking medications, but is compliant. Pt denies Si/HI/ AVH at this time.  A: Pt was given his gold watch back from locker 115, after talking to charge nurse, and MHT that dealt with Pt all day. Pt given scheduled medications. 1:1 observation continues for safety   R: pt remains safe
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
## 2012-03-30 NOTE — Progress Notes (Signed)
[facility]  1:1 Observation Documentation   Nursing 1:1 note D:Pt observed sleeping in bed with eyes closed. RR even and unlabored.  A: 1:1 observation continues for safety  R: pt remains safe   Gregory Meza 03/30/2012,

## 2012-03-30 NOTE — Progress Notes (Addendum)
[facility]  1:1 Observation Documentation   Nursing 1:1 note D:Pt observed sleeping in bed with eyes closed. RR even and unlabored.  A: 1:1 observation continues for safety  R: pt remains safe   Burch Marchuk A 03/30/2012,  

## 2012-03-30 NOTE — Clinical Social Work Note (Signed)
[facility] Group Notes:  (Counselor/Nursing/MHT/Case Management/Adjunct)  01/01/2012 12:00 PM  Type of Therapy:  Group Therapy  Participation Level:  Active  Participation Quality:  Inattentive  Affect:  Labile  Cognitive:  Disorganized  Insight:  Poor  Engagement in Group:  Poor  Engagement in Therapy:  Poor  Modes of Intervention:  Discussion, Socialization and Support  Summary of Progress/Problems: MHA:Pt was inattentive and easily distracted throughout group. He frequently paced room and made repetitive hand gestures at other group members throughout group session. Pt had to be redirected frequently and asked to be quiet. Pt seemed to enjoy speaker's musical performance, as he listened attentively and refrained from causing further interruptions.   Smart, Heather N 01/01/2012, 12:00 PM

## 2012-03-30 NOTE — Progress Notes (Signed)
Recreation Therapy Notes  03/30/2012  Time: 9:30am   Group Topic/Focus: Time management   Participation Level:  Did not attend   Jearl Klinefelter, LRT/CTRS    Orvile Corona L 03/30/2012 10:10 AM

## 2012-03-30 NOTE — Progress Notes (Signed)
[facility] MD Progress Note  03/30/2012 11:19 AM Gregory Meza  MRN:  [id number]  Subjective: Patient reports feeling a little better today. However, he remains delusional, disorganized and out of touch with reality. 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 Patient is compliant with his medications and has no adverse reaction to it.  Diagnosis:  Schizophrenia, paranoid type   ADL's:  Impaired  Sleep: Fair  Appetite:  Fair  Suicidal Ideation:  Plan:  denies Intent:  denies Means:  denis Homicidal Ideation:  Plan:  denies Intent:  denies Means:  denies AEB (as evidenced by):  Psychiatric Specialty Exam: Review of Systems  Constitutional: Negative.   HENT: Negative.   Eyes: Negative.   Respiratory: Negative.   Cardiovascular: Negative.   Gastrointestinal: Negative.   Genitourinary: Negative.   Musculoskeletal: Negative.   Skin: Negative.   Neurological: Negative.   Endo/Heme/Allergies: Negative.   Psychiatric/Behavioral:       Disorganized thought process.     Blood pressure 127/74, pulse 114, temperature 98.2 F (36.8 C), temperature source Oral, resp. rate 18, height 5\' 10"  (1.778 m).There is no weight on file to calculate BMI.  General Appearance: Fairly Groomed  Patent attorney::  Fair  Speech:  Garbled, pressure  Volume:  Increased  Mood:  Irritable  Affect:  Full Range  Thought Process:  Disorganized  Orientation:  Full (Time, Place, and Person)  Thought Content:  Delusions  Suicidal Thoughts:  No  Homicidal Thoughts:  No  Memory:  Immediate;   Fair Recent;   Fair Remote;   Fair  Judgement:  Poor  Insight:  Lacking  Psychomotor Activity:  Increased  Concentration:  Poor  Recall:  Poor  Akathisia:  No  Handed:  Right  AIMS (if indicated):     Assets:  Social Support  Sleep:  Number of Hours: 5   Current Medications: Current Facility-Administered Medications  Medication Dose Route Frequency Provider Last Rate Last Dose  . acetaminophen  (TYLENOL) tablet 650 mg  650 mg Oral Q6H PRN Verne Spurr, PA-C      . alum & mag hydroxide-simeth (MAALOX/MYLANTA) 200-200-20 MG/5ML suspension 30 mL  30 mL Oral Q4H PRN Verne Spurr, PA-C      . fluPHENAZine (PROLIXIN) tablet 15 mg  15 mg Oral [address], PA-C   15 mg at 03/30/12 0657  . fluPHENAZine (PROLIXIN) tablet 20 mg  20 mg Oral QHS Verne Spurr, PA-C   20 mg at 03/29/12 2306  . gabapentin (NEURONTIN) tablet 600 mg  600 mg Oral BID Verne Spurr, PA-C   600 mg at 03/30/12 0750  . lisinopril (PRINIVIL,ZESTRIL) tablet 10 mg  10 mg Oral Daily Verne Spurr, PA-C      . LORazepam (ATIVAN) tablet 1 mg  1 mg Oral Q8H PRN Verne Spurr, PA-C   1 mg at 03/29/12 0819  . magnesium hydroxide (MILK OF MAGNESIA) suspension 30 mL  30 mL Oral Daily PRN Verne Spurr, PA-C      . metoprolol tartrate (LOPRESSOR) tablet 12.5 mg  12.5 mg Oral BID Verne Spurr, PA-C   12.5 mg at 03/30/12 0752  . OLANZapine (ZYPREXA) tablet 20 mg  20 mg Oral BID Verne Spurr, PA-C   20 mg at 03/30/12 0750  . traZODone (DESYREL) tablet 50 mg  50 mg Oral QHS Verne Spurr, PA-C   50 mg at 03/29/12 2306    Lab Results: No results found for this or any previous visit (from the past 48 hour(s)).  Physical Findings: AIMS: Facial and Oral Movements Muscles of Facial Expression: None, normal Lips and Perioral Area: None, normal Jaw: None, normal Tongue: None, normal,Extremity Movements Upper (arms, wrists, hands, fingers): Minimal Lower (legs, knees, ankles, toes): None, normal, Trunk Movements Neck, shoulders, hips: None, normal, Overall Severity Severity of abnormal movements (highest score from questions above): None, normal Incapacitation due to abnormal movements: None, normal Patient's awareness of abnormal movements (rate only patient's report): No Awareness, Dental Status Current problems with teeth and/or dentures?: No Does patient usually wear dentures?: No  CIWA:    COWS:     Treatment Plan  Summary: Daily contact with patient to assess and evaluate symptoms and progress in treatment Medication management  Plan:  Medical Decision Making Problem Points:  Established problem, worsening (2), Review of last therapy session (1) and Review of psycho-social stressors (1) Data Points:  Order Aims Assessment (2) Review of medication regiment & side effects (2) Review of new medications or change in dosage (2)  I certify that inpatient services furnished can reasonably be expected to improve the patient's condition.   Thedore Mins, MD 03/30/2012, 11:19 AM

## 2012-03-30 NOTE — Progress Notes (Signed)
1:1 Note  Patient currently in dayroom watching television with MHT present. Patient denies any complaints or concerns at this time. Will continue to monitor patient for safety.

## 2012-03-30 NOTE — Progress Notes (Signed)
After obtaining blood pressure and administering night dose of blood pressure medication, patient stated that he would start to refuse to take his blood pressure medications and that he would refuse vital signs as well. When asked why, patient replied that "it is 'my' life and 'I' can do what I want with it." Patient then displayed tangential speech and started mentioning random information about his life. Patient was given education on blood pressure and the importance of medications. Patient had little to no interest in RN education and stated that he could do what he wanted.

## 2012-03-30 NOTE — H&P (Signed)
Seen and agreed. Dayne Chait, MD 

## 2012-03-30 NOTE — Progress Notes (Signed)
1:1 Note  Patient in hallway with MHT present. Patient denies any needs or concerns at this time. Will continue to monitor patient for safety.

## 2012-03-30 NOTE — Progress Notes (Signed)
D: Patient denies SI/HI and auditory and visual hallucinations. The patient has a depressed mood and affect. The patient rates his depression a 3 out of 10 and his hopelessness a 1 out of 10(1 low/10 high). The patient reports sleeping well and states that his appetite good and that his energy level is normal. The patient is attending groups and interacting appropriately within the milieu.  A: Patient given emotional support from RN. Patient encouraged to come to staff with concerns and/or questions. Patient's medication routine continued. Patient's orders and plan of care reviewed.  R: Patient remains cooperative. Will continue to monitor patient q15 minutes for safety.

## 2012-03-30 NOTE — Progress Notes (Signed)
1:1 Note  Patient currently in dayroom sitting and watching television with MHT present. Patient currently denies any needs at this time. Patient states that he wants to talk to the MD about his medications and about his stay here. Patient was told that MD or NP/PA would see him tomorrow.

## 2012-03-30 NOTE — Progress Notes (Signed)
Adult Psychoeducational Group Note  Date:  03/30/2012 Time: 2010  Group Topic/Focus:  Wrap-Up Group:   The focus of this group is to help patients review their daily goal of treatment and discuss progress on daily workbooks.  Participation Level:  Minimal  Participation Quality:  Inattentive and Redirectable  Affect:  Blunted  Cognitive:  Disorganized and Delusional  Insight: Lacking and Limited  Engagement in Group:  Off Topic  Modes of Intervention:  Discussion and Support  Additional Comments:  Pt. was present in group. Pt speech was hard to understand and talked about things not relating to group  Gregory Meza Patience 03/30/2012, 10:42 PM

## 2012-03-30 NOTE — Progress Notes (Addendum)
Patient ID: Gregory Meza, male   DOB: 04-29-1965, 47 y.o.   MRN: [id number]  Nursing 1:1 note  D: Pt taking medications. Pt continues to have disorganized thoughts, and incoherent speech. Pt observed between bedroom and dayroom.    A: 1:1 observation continues for safety. Pt given scheduled meds.   R: pt remains safe

## 2012-03-31 MED ORDER — OLANZAPINE 7.5 MG PO TABS
15.0000 mg | ORAL_TABLET | Freq: Two times a day (BID) | ORAL | Status: DC
  Administered 2012-03-31 – 2012-04-04 (×7): 15 mg via ORAL
  Filled 2012-03-31 (×6): qty 2
  Filled 2012-03-31: qty 56
  Filled 2012-03-31: qty 2
  Filled 2012-03-31: qty 56
  Filled 2012-03-31 (×3): qty 2

## 2012-03-31 MED ORDER — CLOTRIMAZOLE 1 % EX CREA
TOPICAL_CREAM | Freq: Two times a day (BID) | CUTANEOUS | Status: DC
  Administered 2012-04-01: 01:00:00 via TOPICAL
  Administered 2012-04-01: 1 via TOPICAL
  Administered 2012-04-02 (×2): via TOPICAL
  Administered 2012-04-02: 1 via TOPICAL
  Administered 2012-04-03 (×2): via TOPICAL
  Filled 2012-03-31 (×2): qty 15

## 2012-03-31 NOTE — Progress Notes (Signed)
[facility] Post 1:1 Observation Documentation  For the first (8) hours following discontinuation of 1:1 precautions, a progress note entry by nursing staff should be documented at least every 2 hours, reflecting the patient's behavior, condition, mood, and conversation.  Use the progress notes for additional entries.  Time: 1700    Patient's Behavior: Pt currently in line, waiting to go down to cafeteria. Patient remains cooperative and stable.  Patient's Condition:  Pt remains stable.  Patient's Conversation:  Pt has no concerns or questions at this time; will continue to monitor patient for safety.  Nestor Ramp [facility] 03/31/2012, 5:17 PM

## 2012-03-31 NOTE — Progress Notes (Addendum)
[facility] Post 1:1 Observation Documentation  For the first (8) hours following discontinuation of 1:1 precautions, a progress note entry by nursing staff should be documented at least every 2 hours, reflecting the patient's behavior, condition, mood, and conversation.  Use the progress notes for additional entries.  Time 1:1 discontinued:  1100  Patient's Behavior:  Pt currently sitting in dayroom watching television.  Patient's Condition:  Patient currently stable.  Patient's Conversation:  Patient states that he is fine and has no concerns at this time.  Para March, Gregory Meza 03/31/2012, 1100

## 2012-03-31 NOTE — Progress Notes (Signed)
[facility] Post 1:1 Observation Documentation  For the first (8) hours following discontinuation of 1:1 precautions, a progress note entry by nursing staff should be documented at least every 2 hours, reflecting the patient's behavior, condition, mood, and conversation.  Use the progress notes for additional entries.  Time:  1500  Patient's Behavior:  Pt currently sleeping in room, no distress noted.  Patient's Condition:  Pt remains stable.  Patient's Conversation:  Pt asleep at present time.  Para March, Gregory Meza 03/31/2012, 1500

## 2012-03-31 NOTE — Progress Notes (Addendum)
[facility] Post 1:1 Observation Documentation  For the first (8) hours following discontinuation of 1:1 precautions, a progress note entry by nursing staff should be documented at least every 2 hours, reflecting the patient's behavior, condition, mood, and conversation.  Use the progress notes for additional entries.  Time: 1300   Patient's Behavior:  Patient currently in hallway coming back from dinner.  Patient's Condition:  Patient remains stable.  Patient's Conversation:  Patient states that he "doesn't need anything" and that he "feels fine."  Audrea Bolte MCCOLLUM 03/31/2012, 1300

## 2012-03-31 NOTE — Progress Notes (Signed)
Patient ID: Gregory Meza, male   DOB: July 23, 1965, 47 y.o.   MRN: [id number] [facility] MD Progress Note  03/31/2012 2:42 PM Gregory Meza  MRN:  [id number]  Subjective: Patient reports decreased delusions and psychosis, however he remains disorganized. He reports adverse reactions to Prozac, Prolixin and Risperdal. He states that those medications make his voice hoarse. Patient is compliant with his medications and has no adverse reaction to it.  Diagnosis:  Schizophrenia, paranoid type   ADL's:  Impaired  Sleep: Fair  Appetite:  Fair  Suicidal Ideation:  Plan:  denies Intent:  denies Means:  denis Homicidal Ideation:  Plan:  denies Intent:  denies Means:  denies AEB (as evidenced by):  Psychiatric Specialty Exam: Review of Systems  Constitutional: Negative.   HENT: Negative.   Eyes: Negative.   Respiratory: Negative.   Cardiovascular: Negative.   Gastrointestinal: Negative.   Genitourinary: Negative.   Musculoskeletal: Negative.   Skin: Negative.   Neurological: Negative.   Endo/Heme/Allergies: Negative.   Psychiatric/Behavioral:       Disorganized thought process.     Blood pressure 128/86, pulse 114, temperature 98.5 F (36.9 C), temperature source Oral, resp. rate 18, height 5\' 10"  (1.778 m), SpO2 98.00%.There is no weight on file to calculate BMI.  General Appearance: Fairly Groomed  Patent attorney::  Fair  Speech:  Garbled, pressure  Volume:  Increased  Mood:  Irritable  Affect:  Full Range  Thought Process:  Disorganized  Orientation:  Full (Time, Place, and Person)  Thought Content:  Delusions  Suicidal Thoughts:  No  Homicidal Thoughts:  No  Memory:  Immediate;   Fair Recent;   Fair Remote;   Fair  Judgement:  Poor  Insight:  Lacking  Psychomotor Activity:  Increased  Concentration:  Poor  Recall:  Poor  Akathisia:  No  Handed:  Right  AIMS (if indicated):     Assets:  Social Support  Sleep:  Number of Hours: 6   Current Medications: Current  Facility-Administered Medications  Medication Dose Route Frequency Provider Last Rate Last Dose  . acetaminophen (TYLENOL) tablet 650 mg  650 mg Oral Q6H PRN Verne Spurr, PA-C      . alum & mag hydroxide-simeth (MAALOX/MYLANTA) 200-200-20 MG/5ML suspension 30 mL  30 mL Oral Q4H PRN Verne Spurr, PA-C      . fluPHENAZine (PROLIXIN) tablet 15 mg  15 mg Oral [address] PA-C   15 mg at 03/31/12 7829  . fluPHENAZine (PROLIXIN) tablet 20 mg  20 mg Oral QHS Verne Spurr, PA-C   20 mg at 03/30/12 2059  . gabapentin (NEURONTIN) tablet 600 mg  600 mg Oral BID Verne Spurr, PA-C   600 mg at 03/31/12 0746  . lisinopril (PRINIVIL,ZESTRIL) tablet 10 mg  10 mg Oral Daily Verne Spurr, PA-C   10 mg at 03/31/12 0747  . LORazepam (ATIVAN) tablet 1 mg  1 mg Oral Q8H PRN Verne Spurr, PA-C   1 mg at 03/29/12 0819  . magnesium hydroxide (MILK OF MAGNESIA) suspension 30 mL  30 mL Oral Daily PRN Verne Spurr, PA-C      . metoprolol tartrate (LOPRESSOR) tablet 12.5 mg  12.5 mg Oral BID Verne Spurr, PA-C   12.5 mg at 03/31/12 0747  . OLANZapine (ZYPREXA) tablet 20 mg  20 mg Oral BID Verne Spurr, PA-C   20 mg at 03/31/12 0746  . traZODone (DESYREL) tablet 50 mg  50 mg Oral QHS Verne Spurr, PA-C   50 mg at 03/30/12  2100    Lab Results: No results found for this or any previous visit (from the past 48 hour(s)).  Physical Findings: AIMS: Facial and Oral Movements Muscles of Facial Expression: None, normal Lips and Perioral Area: None, normal Jaw: None, normal Tongue: None, normal,Extremity Movements Upper (arms, wrists, hands, fingers): Minimal Lower (legs, knees, ankles, toes): None, normal, Trunk Movements Neck, shoulders, hips: None, normal, Overall Severity Severity of abnormal movements (highest score from questions above): None, normal Incapacitation due to abnormal movements: None, normal Patient's awareness of abnormal movements (rate only patient's report): No Awareness, Dental  Status Current problems with teeth and/or dentures?: No Does patient usually wear dentures?: No  CIWA:    COWS:     Treatment Plan Summary: Daily contact with patient to assess and evaluate symptoms and progress in treatment Medication management  Plan: 1. Will discontinue Prolixin  2. Increase Zyprexa to 15mg  BID  Medical Decision Making Problem Points:  Established problem, worsening (2), Review of last therapy session (1) and Review of psycho-social stressors (1) Data Points:  Order Aims Assessment (2) Review of medication regiment & side effects (2) Review of new medications or change in dosage (2)  I certify that inpatient services furnished can reasonably be expected to improve the patient's condition.   Thedore Mins, MD 03/31/2012, 2:42 PM

## 2012-03-31 NOTE — Clinical Social Work Note (Signed)
[facility] Group Notes:  (Counselor/Nursing/MHT/Case Management/Adjunct)  12/31/2011 2:20 PM  Type of Therapy:  Group Therapy  Participation Level:  Did Not Attend    Summary of Progress/Problems: .balance: The topic for group was balance in life.  Pt participated in the discussion about when their life was in balance and out of balance and how this feels.  Pt discussed ways to get back in balance and short term goals they can work on to get where they want to be.    Athziri Freundlich B 12/31/2011, 2:20 PM  

## 2012-03-31 NOTE — Progress Notes (Signed)
D:Patient in bed at the beginning f this shift, not sleeping. A: Writer asked patient if he needed medication to help him go back to sleep. He seemed polite and showed no sign of irritability or agitation. R: Patient stated "no", his speech slurred and very difficult to comprehend.

## 2012-03-31 NOTE — Progress Notes (Signed)
Patient ID: Gregory Meza, male   DOB: 1965/09/18, 47 y.o.   MRN: [id number]  D: Pt denies SI/HI/AVH. Pt is pleasant and cooperative. Pt states he is allergic to Seroquel and Prolixin, and the prolixin makes him act crazy. Pt says he is ready to go home. 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 Pt seems to have delusions and continues to have incoherent speech at times. Pt complained of feet hurting, but did not want a male nurse to look at them. A male nurse looked at his feet, and there was a  recommendation for fungus cream .  A: Pt was offered support and encouragement. Pt was given scheduled medications. Pt was encourage to attend groups. Q 15 minute checks were done for safety. Dr called to get cream for fungus. Pt given lotrimin at 0119  R:Pt attends groups and interacts well with peers and staff. Pt is taking medication. Pt has no complaints at this time.Pt receptive to treatment and safety maintained on unit.

## 2012-03-31 NOTE — Progress Notes (Signed)
D: Patient denies SI/HI and auditory and visual hallucinations. Patient has a depressed mood and affect. Patient taken off 1:1 observation by MD. Patient states that he slept fairly well and that his appetite and energy level are "almost normal." Patient is attending groups and interacting appropriately within the milieu.  A: Patient given emotional support from RN. Patient encouraged to come to staff with concerns and/or questions. Patient's medication routine continued. Patient's orders and plan of care reviewed.  R: Patient remains cooperative. Will continue to monitor patient q15 minutes for safety.
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
## 2012-03-31 NOTE — Progress Notes (Signed)
[facility] Group Notes:  (Nursing/MHT/Case Management/Adjunct)  Date:  03/31/2012  Time:  4:21 PM  Type of Therapy:  Psychoeducational Skills  Participation Level:  Active  Participation Quality:  Appropriate and Attentive  Affect:  Appropriate  Cognitive:  Oriented  Insight:  Good  Engagement in Group:  Engaged  Modes of Intervention:  Activity, Discussion, Education, Rapport Building, Socialization and Support  Summary of Progress/Problems: Gregory Meza participated in sharing activity, stated that his goal for the day is to talk to the doctor about his medicine which he did, named one support person, and that his favorite winter pastime is playign in the snow.   Wandra Scot 03/31/2012, 4:21 PM

## 2012-04-01 ENCOUNTER — Encounter (INDEPENDENT_AMBULATORY_CARE_PROVIDER_SITE_OTHER): Payer: Self-pay

## 2012-04-01 MED ORDER — FLUPHENAZINE DECANOATE 25 MG/ML IJ SOLN
50.0000 mg | Freq: Once | INTRAMUSCULAR | Status: AC
  Administered 2012-04-02: 50 mg via INTRAMUSCULAR
  Filled 2012-04-01: qty 2

## 2012-04-01 MED ORDER — DIPHENHYDRAMINE HCL 50 MG/ML IJ SOLN
50.0000 mg | Freq: Once | INTRAMUSCULAR | Status: DC
  Filled 2012-04-01 (×2): qty 1

## 2012-04-01 NOTE — Progress Notes (Signed)
Adult Psychoeducational Group Note  Date:  04/01/2012 Time:  2000 Group Topic/Focus:  Wrap-Up Group:   The focus of this group is to help patients review their daily goal of treatment and discuss progress on daily workbooks.  Participation Level:  Minimal  Participation Quality:  Drowsy and Inattentive  Affect:  Blunted  Cognitive:  Confused and Delusional  Insight: Lacking  Engagement in Group:  Limited and Off Topic  Modes of Intervention:  Discussion and Support  Additional Comments:  Pt. Participated in group but speech was hard to understand. Pt. Was off topic and need several redirections.   Gwenevere Ghazi Patience 04/01/2012, 9:30 PM

## 2012-04-01 NOTE — Progress Notes (Signed)
Recreation Therapy Notes   04/01/2012  Time: 9:30am   Group Topic/Focus: Exercise   Participation Level:  Did not attend   Jearl Klinefelter, LRT/CTRS    Ivan Maskell L 04/01/2012 10:16 AM

## 2012-04-01 NOTE — Progress Notes (Signed)
Patient ID: Gregory Meza, male   DOB: 1965/07/30, 47 y.o.   MRN: [id number] [facility] MD Progress Note  04/01/2012 11:34 AM ROLANDO HESSLING  MRN:  [id number]  Subjective: Patient remains disorganized but reports decreased delusions and psychotic symptoms. Patient is compliant with his medications and has no adverse reaction to it.  Diagnosis:  Schizophrenia, paranoid type   ADL's:  Impaired  Sleep: Fair  Appetite:  Fair  Suicidal Ideation:  Plan:  denies Intent:  denies Means:  denis Homicidal Ideation:  Plan:  denies Intent:  denies Means:  denies AEB (as evidenced by):  Psychiatric Specialty Exam: Review of Systems  Constitutional: Negative.   HENT: Negative.   Eyes: Negative.   Respiratory: Negative.   Cardiovascular: Negative.   Gastrointestinal: Negative.   Genitourinary: Negative.   Musculoskeletal: Negative.   Skin: Negative.   Neurological: Negative.   Endo/Heme/Allergies: Negative.   Psychiatric/Behavioral:       Disorganized thought process.     Blood pressure 129/78, pulse 123, temperature 98.3 F (36.8 C), temperature source Oral, resp. rate 18, height 5\' 10"  (1.778 m), SpO2 98.00%.There is no weight on file to calculate BMI.  General Appearance: Fairly Groomed  Patent attorney::  Fair  Speech:  Garbled, pressured  Volume:  Increased  Mood:  Irritable  Affect:  Full Range  Thought Process:  Disorganized  Orientation:  Full (Time, Place, and Person)  Thought Content:  Delusions  Suicidal Thoughts:  No  Homicidal Thoughts:  No  Memory:  Immediate;   Fair Recent;   Fair Remote;   Fair  Judgement:  Poor  Insight:  Lacking  Psychomotor Activity:  Increased  Concentration:  Poor  Recall:  Poor  Akathisia:  No  Handed:  Right  AIMS (if indicated):     Assets:  Social Support  Sleep:  Number of Hours: 3.5   Current Medications: Current Facility-Administered Medications  Medication Dose Route Frequency Provider Last Rate Last Dose  . acetaminophen  (TYLENOL) tablet 650 mg  650 mg Oral Q6H PRN Verne Spurr, PA-C      . alum & mag hydroxide-simeth (MAALOX/MYLANTA) 200-200-20 MG/5ML suspension 30 mL  30 mL Oral Q4H PRN Verne Spurr, PA-C      . clotrimazole (LOTRIMIN) 1 % cream   Topical BID Jorje Guild, PA-C   1 application at 04/01/12 0740  . diphenhydrAMINE (BENADRYL) injection 50 mg  50 mg Intramuscular Once Verne Spurr, PA-C      . fluPHENAZine decanoate (PROLIXIN) injection 50 mg  50 mg Intramuscular Once [employer], PA-C      . gabapentin (NEURONTIN) tablet 600 mg  600 mg Oral BID Verne Spurr, PA-C   600 mg at 04/01/12 0730  . lisinopril (PRINIVIL,ZESTRIL) tablet 10 mg  10 mg Oral Daily Verne Spurr, PA-C   10 mg at 04/01/12 0730  . LORazepam (ATIVAN) tablet 1 mg  1 mg Oral Q8H PRN Verne Spurr, PA-C   1 mg at 03/29/12 0819  . magnesium hydroxide (MILK OF MAGNESIA) suspension 30 mL  30 mL Oral Daily PRN Verne Spurr, PA-C      . metoprolol tartrate (LOPRESSOR) tablet 12.5 mg  12.5 mg Oral BID Verne Spurr, PA-C   12.5 mg at 04/01/12 0730  . OLANZapine (ZYPREXA) tablet 15 mg  15 mg Oral BID Adarian Bur   15 mg at 04/01/12 0730  . traZODone (DESYREL) tablet 50 mg  50 mg Oral QHS Verne Spurr, PA-C   50 mg at 03/31/12 2114  Lab Results: No results found for this or any previous visit (from the past 48 hour(s)).  Physical Findings: AIMS: Facial and Oral Movements Muscles of Facial Expression: None, normal Lips and Perioral Area: None, normal Jaw: None, normal Tongue: None, normal,Extremity Movements Upper (arms, wrists, hands, fingers): Minimal Lower (legs, knees, ankles, toes): None, normal, Trunk Movements Neck, shoulders, hips: None, normal, Overall Severity Severity of abnormal movements (highest score from questions above): None, normal Incapacitation due to abnormal movements: None, normal Patient's awareness of abnormal movements (rate only patient's report): No Awareness, Dental Status Current problems with  teeth and/or dentures?: No Does patient usually wear dentures?: No  CIWA:    COWS:     Treatment Plan Summary: Daily contact with patient to assess and evaluate symptoms and progress in treatment Medication management  Plan: 1. Will start patient on Prolixin decanoate 50mg  IM Q2wkly, 1st dose today. 2. Continue  Zyprexa  15mg  BID for psychosis/delusions 3. Encourage patient to attend group and other unit activities.  Medical Decision Making Problem Points:  Established problem, improving, Review of last therapy session (1) and Review of psycho-social stressors (1) Data Points:  Order Aims Assessment (2) Review of medication regiment & side effects (2) Review of new medications or change in dosage (2)  I certify that inpatient services furnished can reasonably be expected to improve the patient's condition.   Thedore Mins, MD 04/01/2012, 11:34 AM

## 2012-04-01 NOTE — Progress Notes (Addendum)
D: Patient denies SI/HI and auditory and visual hallucinations. Patient has a depressed mood and affect. Patient states that he slept fairly well and that his appetite and energy level are normal. Patient is attending some groups and interacting appropriately within the milieu. Patient states that he is ready to "go home today" and that he feels he "a lot better than 'he' was."  A: Patient given emotional support from RN. Patient encouraged to come to staff with concerns and/or questions. Patient's medication routine continued. Patient's orders and plan of care reviewed.   R: Patient remains cooperative. Will continue to monitor patient q15 minutes for safety.

## 2012-04-01 NOTE — Plan of Care (Signed)
Problem: Diagnosis: Increased Risk For Suicide Attempt Goal: LTG-Patient Family Informed of Increased Suicide Risk LTG (by discharge) Patient's family or significant other informed of patient's increased risk for suicide and they will be able to verbalize ways they can help the patient stay safe after discharge.  Outcome: Not Met (add Reason) Responsibility of case management at [facility].

## 2012-04-01 NOTE — Treatment Plan (Signed)
Interdisciplinary Treatment Plan Update (Adult)  Date: 04/01/2012  Time Reviewed: 10:49 AM   Progress in Treatment: Attending groups: Yes Participating in groups: Yes, but in limited fashion Taking medication as prescribed: Yes Tolerating medication: Yes   Family/Significant other contact made:  No Patient understands diagnosis:  No Limited insight Discussing patient identified problems/goals with staff:  Yes  See below Medical problems stabilized or resolved:  Yes Denies suicidal/homicidal ideation: Yes  In tx team Issues/concerns per patient self-inventory:  Not filled out Other:  New problem(s) identified: N/A  Reason for Continuation of Hospitalization: Hallucinations Medication stabilization Other; describe Labile mood  Interventions implemented related to continuation of hospitalization: D/C Prolixin, Increase Zyprexa   Encourage group attendance and participation  D/C 1:1 staffing for labile mood  Additional comments:  Estimated length of stay: 3-5 days  Discharge Plan: return home, follow up with [facility] ACT team  New goal(s): N/A  Review of initial/current patient goals per problem list:   1.  Goal(s):Eliminate psychosis with the use of medication/therapeutic milieu  Met:  No  Target date:2/14  As evidenced by: Gregory Meza is currently disorganized with tangential and circumstantial thought patterns  2.  Goal (s):Stabilize mood with the use of medication/therapeutic milieu  Met:  Yes  Target date:2/14  As evidenced Gregory Meza is no longer labile.  His mood is good, and he was taken off of 1;1 staffing yesterday  3.  Goal(s): Coordinate services with [facility] ACT team  Met:  No  Target date:2/19  As evidenced by: phone call; meeting in person  4.  Goal(s):  Met:  No  Target date:  As evidenced by:  Attendees: Patient:     Family:     Physician:  Thedore Mins 04/01/2012 10:49 AM   Nursing:  Nestor Ramp  04/01/2012 10:49 AM    Clinical Social Worker:  Richelle Ito 04/01/2012 10:49 AM   Extender:  Verne Spurr PA 04/01/2012 10:49 AM   Other:     Other:     Other:     Other:      Scribe for Treatment Team:   Ida Rogue, 04/01/2012 10:49 AM
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
## 2012-04-02 DIAGNOSIS — F200 Paranoid schizophrenia: Secondary | ICD-10-CM

## 2012-04-02 MED ORDER — DIPHENHYDRAMINE HCL 50 MG/ML IJ SOLN: 50.0000 mg | Freq: Four times a day (QID) | INTRAMUSCULAR | Status: DC | PRN

## 2012-04-02 MED ORDER — DIPHENHYDRAMINE HCL 50 MG PO CAPS
50.0000 mg | ORAL_CAPSULE | Freq: Four times a day (QID) | ORAL | Status: DC | PRN
  Administered 2012-04-02: 50 mg via ORAL
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
## 2012-04-02 NOTE — Progress Notes (Signed)
Patient ID: Gregory Meza, male   DOB: February 09, 1966, 47 y.o.   MRN: [id number] 04-02-12 @ 1246 nursing shift note: D: pt was given his prolixin injectable today. He had refused this yesterday. He has a forced medication order in place. A: encouraged pt to take the injection, using encouragement and support. R: he stated he was allergic to it, but the extender advised the RN to give the injection. He got the injection, but was not happy about it. RN will continue to support pt. Q 15 min ck's continue as well as safety.

## 2012-04-02 NOTE — Clinical Social Work Note (Signed)
[facility] Group Notes:  (Clinical Social Work)  04/02/2012  11:15-11:45AM  Summary of Progress/Problems:   The main focus of today's process group was for the patient to identify ways in which they have in the past sabotaged their own recovery and reasons they may have done this/what they received from doing it.  We then worked to identify a specific plan to avoid doing this when discharged from the hospital for this admission.  The patient expressed a number of disjointed and irrelevant thoughts (to the topic of group).  "My word is my bond.  I answer to [name]."  Type of Therapy:  Group Therapy - Process  Participation Level:  Active  Participation Quality:  Intrusive and Redirectable  Affect:  Excited and Flat  Cognitive:  Disorganized  Insight:  Monopolizing  Engagement in Therapy:  Off Topic  Modes of Intervention:  Clarification, Education, Limit-setting, Problem-solving, Socialization, Support and Processing, Exploration, Discussion   Ambrose Mantle, LCSW 04/02/2012, 1:30 PM

## 2012-04-02 NOTE — Progress Notes (Signed)
Patient ID: SHAYDEN BOBIER, male   DOB: [birth year], 47 y.o.   MRN: [id number]  D: Pt continues to have disorganized thoughts and still is delusional with religious undertones.  Pt denies SI/HI/AVH. Pt is pleasant and cooperative. Pt states he is allergic to Zyprexa, so he refused his night dose.    A: Pt was offered support and encouragement. Pt was given scheduled medications. Pt was encourage to attend groups. Q 15 minute checks were done for safety. Given lotrimin at 309.   R:Pt attends groups and interacts  with peers and staff. Pt is taking medication. Pt has no complaints.Pt receptive to treatment and safety maintained on unit.

## 2012-04-02 NOTE — Progress Notes (Signed)
Adult Psychoeducational Group Note  Date:  04/02/2012 Time:  1020  Group Topic/Focus:  Coping With Mental Health Crisis:   The purpose of this group is to help patients identify strategies for coping with mental health crisis.  Group discusses possible causes of crisis and ways to manage them effectively.  Participation Level:  Active  Participation Quality:  Appropriate, Sharing and Supportive  Affect:  Appropriate  Cognitive:  Alert and Appropriate  Insight: Appropriate and Improving  Engagement in Group:  Engaged and Supportive  Modes of Intervention:  Discussion, Education, Exploration, Problem-solving and Support  Additional Comments:   Arturo Morton 04/02/2012, 11:01 AM

## 2012-04-02 NOTE — Progress Notes (Signed)
Patient ID: Gregory Meza, male   DOB: [birth year], 47 y.o.   MRN: [id number] Psychoeducational Group Note  Date:  04/02/2012 Time:0930am  Group Topic/Focus:  Identifying Needs:   The focus of this group is to help patients identify their personal needs that have been historically problematic and identify healthy behaviors to address their needs.  Participation Level:  Active  Participation Quality:  Redirectable  Affect:  Flat  Cognitive:  Disorganized  Insight:  Limited  Engagement in Group:  Off Topic  Additional Comments:  Inventory group   Valente David 04/02/2012,10:01 AM
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
## 2012-04-02 NOTE — Progress Notes (Signed)
Patient ID: Gregory Meza, male   DOB: 10-Mar-1965, 47 y.o.   MRN: [id number]  Pt refused Lithium blood draw.Marland KitchenMarland KitchenSaying it is against his religion

## 2012-04-03 NOTE — Progress Notes (Signed)
[facility] Group Notes:  (Nursing/MHT/Case Management/Adjunct)  Date:  04/03/2012  Time:  2000  Type of Therapy:  Psychoeducational Skills  Participation Level:  Active  Participation Quality:  Appropriate  Affect:  Appropriate  Cognitive:  Appropriate  Insight:  Improving  Engagement in Group:  Improving  Modes of Intervention:  Education  Summary of Progress/Problems: The patient stated that he had a good day overall. He mentioned that he had a good visit with his brother. 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 His goal for tomorrow is to speak with his doctor about getting discharged. He also verbalized that he would like to go to [facility] for follow up care.   Hazle Coca S 04/03/2012, 9:43 PM

## 2012-04-03 NOTE — Clinical Social Work Note (Signed)
[facility] Group Notes:  (Clinical Social Work)  04/03/2012   11:15-11:45AM  Summary of Progress/Problems:  The main focus of today's process group was to listen to a variety of genres of music and to identify that different types of music provoke different responses.  The patient then was able to identify personally what was soothing for them, as well as energizing.  Handouts were used to record feelings evoked, as well as how patient can personally use this knowledge in sleep habits, with depression, and with other symptoms.  The patient expressed fair understanding of his emotions and how to use music in his wellness plan.  He still displayed significantly disorganized speech.  Type of Therapy:  Music Therapy with processing done  Participation Level:  Active  Participation Quality:  Attentive  Affect:  Flat  Cognitive:  Disorganized  Insight:  Developing/Improving  Engagement in Therapy:  Developing/Improving  Modes of Intervention:   Socialization, Support and Processing, Exploration, Education, Rapport Building   Ambrose Mantle, LCSW 04/03/2012, 12:34 PM

## 2012-04-03 NOTE — Progress Notes (Signed)
Patient ID: Gregory Meza, male   DOB: 1965/09/12, 47 y.o.   MRN: [id number] [facility] MD Progress Note  04/03/2012 3:38 PM Gregory Meza  MRN:  [id number]  Subjective: Met with the patient 1:1 today to discuss his progress. My brother is coming to see me today. I want to go home tomorrow. He is much easier to understand today and he is focused on getting out of hospital and hopes it is tomorrow. He did attend groups today. Dhani tries to convince this provider that he is allergic to prolixin, haldol, Seroquel, and Prozac but he states that he has had no allergic problems at all since yesterday on the Prolixin decoanate. Diagnosis:  Schizophrenia, paranoid type  ADL's:  Impaired  Sleep: I sleep good everynight.  Appetite:  Fair  Suicidal Ideation:  Unable to assess due to his behavior. Homicidal Ideation: Unable to assess due to his behavior, however he does shake his fist at me while trying to explain that he is a danger to himself and others. He does stop this when asked.  AEB (as evidenced by):His report of a decrease in symptoms, improved appetite and sleep, his affect and his ability to participate in unit programming.  Psychiatric Specialty Exam: Review of Systems  Constitutional: Negative.   HENT: Negative.   Eyes: Negative.   Respiratory: Negative.   Cardiovascular: Negative.   Gastrointestinal: Negative.   Genitourinary: Negative.   Musculoskeletal: Negative.   Skin: Negative.   Neurological: Negative.   Endo/Heme/Allergies: Negative.   Psychiatric/Behavioral:       Disorganized thought process.     Blood pressure 126/73, pulse 105, temperature 98.1 F (36.7 C), temperature source Oral, resp. rate 18, height 5\' 10"  (1.778 m), SpO2 98.00%.There is no weight on file to calculate BMI.  General Appearance: Fairly Groomed  Patent attorney::  Fair  Speech:  Garbled, pressured  Volume:  Increased  Mood:  Irritable labile  Affect:  Full Range  Thought Process:  Patient notes  that he is not hearing voices or hallucinating  Orientation:  Full (Time, Place, and Person)  Thought Content:  Delusions  Suicidal Thoughts:  denies  Homicidal Thoughts: denies  Memory:  Immediate;   Fair Recent;   Fair Remote;   Fair  Judgement:  Poor  Insight:  Lacking  Psychomotor Activity:  Increased  Concentration:  Poor  Recall:  Poor  Akathisia:  No  Handed:  Right  AIMS (if indicated):     Assets:  Social Support  Sleep:  Number of Hours: 4.75   Current Medications: Current Facility-Administered Medications  Medication Dose Route Frequency Provider Last Rate Last Dose  . acetaminophen (TYLENOL) tablet 650 mg  650 mg Oral Q6H PRN Verne Spurr, PA-C      . alum & mag hydroxide-simeth (MAALOX/MYLANTA) 200-200-20 MG/5ML suspension 30 mL  30 mL Oral Q4H PRN Verne Spurr, PA-C      . clotrimazole (LOTRIMIN) 1 % cream   Topical BID Jorje Guild, PA-C   1 application at 04/02/12 1702  . diphenhydrAMINE (BENADRYL) capsule 50 mg  50 mg Oral Q6H PRN Verne Spurr, PA-C   50 mg at 04/02/12 1201   Or  . diphenhydrAMINE (BENADRYL) injection 50 mg  50 mg Intramuscular Q6H PRN Verne Spurr, PA-C      . diphenhydrAMINE (BENADRYL) injection 50 mg  50 mg Intramuscular Once [employer], PA-C      . gabapentin (NEURONTIN) tablet 600 mg  600 mg Oral BID Verne Spurr, PA-C   600  mg at 04/03/12 0858  . lisinopril (PRINIVIL,ZESTRIL) tablet 10 mg  10 mg Oral Daily Verne Spurr, PA-C   10 mg at 04/02/12 1610  . LORazepam (ATIVAN) tablet 1 mg  1 mg Oral Q8H PRN Verne Spurr, PA-C   1 mg at 03/29/12 0819  . magnesium hydroxide (MILK OF MAGNESIA) suspension 30 mL  30 mL Oral Daily PRN Verne Spurr, PA-C      . metoprolol tartrate (LOPRESSOR) tablet 12.5 mg  12.5 mg Oral BID Verne Spurr, PA-C   12.5 mg at 04/02/12 1702  . OLANZapine (ZYPREXA) tablet 15 mg  15 mg Oral BID Mojeed Akintayo   15 mg at 04/03/12 0858  . traZODone (DESYREL) tablet 50 mg  50 mg Oral QHS Verne Spurr, PA-C   50 mg at  04/02/12 2055    Lab Results: No results found for this or any previous visit (from the past 48 hour(s)).  Physical Findings: AIMS: Facial and Oral Movements Muscles of Facial Expression: None, normal Lips and Perioral Area: None, normal Jaw: None, normal Tongue: None, normal,Extremity Movements Upper (arms, wrists, hands, fingers): Minimal Lower (legs, knees, ankles, toes): None, normal, Trunk Movements Neck, shoulders, hips: None, normal, Overall Severity Severity of abnormal movements (highest score from questions above): None, normal Incapacitation due to abnormal movements: None, normal Patient's awareness of abnormal movements (rate only patient's report): No Awareness, Dental Status Current problems with teeth and/or dentures?: No Does patient usually wear dentures?: No  CIWA:    COWS:     Treatment Plan Summary: Daily contact with patient to assess and evaluate symptoms and progress in treatment Medication management  Plan: 1.The oral Prolixin was discontinued. 2. Continue  Zyprexa  15mg  BID for psychosis/delusions 3. Encourage patient to attend group and other unit activities. 4. Benadryl 50mg  given po for EPS or allergic reactions. 5. Patient is focused on leaving on Monday. Medical Decision Making Problem Points:  Established problem, improving, Review of last therapy session (1) and Review of psycho-social stressors (1) Data Points:  Order Aims Assessment (2) Review of medication regiment & side effects (2) Review of new medications or change in dosage (2)  I certify that inpatient services furnished can reasonably be expected to improve the patient's condition.  Rona Ravens. Rhylan Kagel RPAC 04/03/2012, 3:38 PM

## 2012-04-03 NOTE — Progress Notes (Signed)
Patient ID: Gregory Meza, male   DOB: Nov 26, 1965, 47 y.o.   MRN: [id number] Psychoeducational Group Note  Date:  04/03/2012 Time:  1000am  Group Topic/Focus:  Making Healthy Choices:   The focus of this group is to help patients identify negative/unhealthy choices they were using prior to admission and identify positive/healthier coping strategies to replace them upon discharge.  Participation Level:  Active  Participation Quality:  Appropriate  Affect:  Lethargic  Cognitive:  Appropriate  Insight:  Supportive  Engagement in Group:  Supportive  Additional Comments:  Inventory group   Valente David 04/03/2012,9:53 AM

## 2012-04-03 NOTE — Progress Notes (Signed)
Patient ID: Gregory Meza, male   DOB: 27-Oct-1965, 47 y.o.   MRN: [id number] D. The patient spent the evening resting in bed with his eyes closed.  A. Attempted to wake patient to attend evening group. Administered HS medications. R. The patient did not attend evening group. He was compliant with medications.

## 2012-04-03 NOTE — Progress Notes (Signed)
Psychoeducational Group Note  Date:  04/02/2012 Time:  2000  Group Topic/Focus:  Wrap-Up Group:   The focus of this group is to help patients review their daily goal of treatment and discuss progress on daily workbooks.  Participation Level: Did Not Attend  Participation Quality:  Not Applicable  Affect:  Not Applicable  Cognitive:  Not Applicable  Insight:  Not Applicable  Engagement in Group: Not Applicable  Additional Comments:  The patient was asleep in his bed during the group.   Yaslene Lindamood S 04/03/2012, 1:07 AM

## 2012-04-04 MED ORDER — OLANZAPINE 15 MG PO TABS: 15.0000 mg | ORAL_TABLET | Freq: Two times a day (BID) | ORAL | Status: DC

## 2012-04-04 MED ORDER — GABAPENTIN 600 MG PO TABS: 600.0000 mg | ORAL_TABLET | Freq: Two times a day (BID) | ORAL | Status: DC

## 2012-04-04 MED ORDER — TRAZODONE HCL 50 MG PO TABS: 50.0000 mg | ORAL_TABLET | Freq: Every day | ORAL | Status: DC

## 2012-04-04 MED ORDER — METOPROLOL TARTRATE 12.5 MG HALF TABLET: 12.5000 mg | ORAL_TABLET | Freq: Two times a day (BID) | ORAL | Status: DC

## 2012-04-04 MED ORDER — LISINOPRIL 10 MG PO TABS: 10.0000 mg | ORAL_TABLET | Freq: Every day | ORAL | Status: DC

## 2012-04-04 NOTE — Progress Notes (Signed)
Patient ID: Gregory Meza, male   DOB: 01/25/1966, 47 y.o.   MRN: [id number] Discharged note: pt discharged to self. Pt picked up by ACT member. Pt received both written and verbal discharged instructions. Pt denies SI/HI/AVH. Pt denies pain and show no s/s of distress. Pt agreed to f/u appointments and medications regimen. Pt received all belongings from room and locker. Pt received sample meds and prescriptions. Pt cooperative during process

## 2012-04-04 NOTE — Progress Notes (Signed)
[facility] Adult Case Management Discharge Plan :  Will you be returning to the same living situation after discharge: Yes,  home At discharge, do you have transportation home?:Yes,  ACT team Do you have the ability to pay for your medications:Yes,  ACT team  Release of information consent forms completed and in the chart;  Patient's signature needed at discharge.  Patient to Follow up at: Follow-up Information   Follow up with [facility] ACT team. (They will pick you up today at 1:30)    Contact information:   [address]  [place]  [phone]      Patient denies SI/HI:   Yes,  yes    Safety Planning and Suicide Prevention discussed:  Yes,  yes  [name] B 04/04/2012, 10:44 AM
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
## 2012-04-04 NOTE — [facility] Suicide Risk Assessment (Signed)
Suicide Risk Assessment  Discharge Assessment     Demographic Factors:  Male, Low socioeconomic status and Unemployed  Mental Status Per Nursing Assessment::   On Admission:  NA  Current Mental Status by Physician: NA  Loss Factors: Decrease in vocational status and Financial problems/change in socioeconomic status  Historical Factors: Impulsivity  Risk Reduction Factors:   Living with another person, especially a relative, Positive social support and Positive therapeutic relationship  Continued Clinical Symptoms:  Resolving delusions  Cognitive Features That Contribute To Risk:  Closed-mindedness Polarized thinking    Suicide Risk:  Minimal: No identifiable suicidal ideation.  Patients presenting with no risk factors but with morbid ruminations; may be classified as minimal risk based on the severity of the depressive symptoms  Discharge Diagnoses:   AXIS I:  Schizophrenia, paranoid type  AXIS II:  Deferred AXIS III:   Past Medical History  Diagnosis Date   AXIS IV:  other psychosocial or environmental problems and problems related to social environment AXIS V:  61-70 mild symptoms  Plan Of Care/Follow-up recommendations:  Activity:  as tolerated Diet:  healthy Tests:  routine blood work up. Other:  patient to keep his after care appointment.  Is patient on multiple antipsychotic therapies at discharge:  No   Has Patient had three or more failed trials of antipsychotic monotherapy by history:  No  Recommended Plan for Multiple Antipsychotic Therapies: N/A  Myrtice Lowdermilk,MD 04/04/2012, 10:08 AM

## 2012-04-06 NOTE — Discharge Summary (Signed)
Seen and agreed. Dickie Cloe, MD 

## 2012-04-08 NOTE — Progress Notes (Signed)
Patient Discharge Instructions:  After Visit Summary (AVS):   Faxed to:  04/08/12 Discharge Summary Note:   Faxed to:  04/08/12 Psychiatric Admission Assessment Note:   Faxed to:  04/08/12 Suicide Risk Assessment - Discharge Assessment:   Faxed to:  04/08/12 Faxed/Sent to the Next Level Care provider:  04/08/12 Faxed to [facility] @ [phone]  Jerelene Redden, 04/08/2012, 2:28 PM

## 2012-07-25 ENCOUNTER — Encounter (HOSPITAL_COMMUNITY): Payer: Self-pay | Admitting: Emergency Medicine

## 2012-07-25 ENCOUNTER — Emergency Department (HOSPITAL_COMMUNITY): Payer: Medicaid Other

## 2012-07-25 ENCOUNTER — Emergency Department (HOSPITAL_COMMUNITY)
Admission: EM | Admit: 2012-07-25 | Discharge: 2012-07-25 | Disposition: A | Discharge status: Home / Self Care | Payer: Medicaid Other | Attending: Emergency Medicine | Admitting: Emergency Medicine

## 2012-07-25 DIAGNOSIS — J40 Bronchitis, not specified as acute or chronic: Secondary | ICD-10-CM

## 2012-07-25 DIAGNOSIS — F209 Schizophrenia, unspecified: Secondary | ICD-10-CM | POA: Insufficient documentation

## 2012-07-25 DIAGNOSIS — F17200 Nicotine dependence, unspecified, uncomplicated: Secondary | ICD-10-CM | POA: Insufficient documentation

## 2012-07-25 DIAGNOSIS — R093 Abnormal sputum: Secondary | ICD-10-CM | POA: Insufficient documentation

## 2012-07-25 DIAGNOSIS — J209 Acute bronchitis, unspecified: Secondary | ICD-10-CM | POA: Insufficient documentation

## 2012-07-25 MED ORDER — DOXYCYCLINE HYCLATE 100 MG PO CAPS: 100.0000 mg | ORAL_CAPSULE | Freq: Two times a day (BID) | ORAL | Status: DC

## 2012-07-25 NOTE — ED Provider Notes (Addendum)
History     CSN: [id number]  Arrival date & time 07/25/12  0455   First MD Initiated Contact with Patient 07/25/12 0510      Chief Complaint  Patient presents with  . Allergic Reaction    (Consider location/radiation/quality/duration/timing/severity/associated sxs/prior treatment) Patient is a 47 y.o. male presenting with allergic reaction and cough. The history is provided by the patient. No language interpreter was used.  Allergic Reaction Presenting symptoms: no difficulty breathing, no difficulty swallowing, no itching, no rash and no wheezing   Severity:  Mild Context: medications   Relieved by:  Nothing Worsened by:  Nothing tried Ineffective treatments:  None tried Cough Cough characteristics:  Productive Sputum characteristics:  Yellow Severity:  Mild Onset quality:  Gradual Timing:  Intermittent Progression:  Unchanged Chronicity:  New Smoker: yes   Worsened by:  Nothing tried Ineffective treatments:  None tried Associated symptoms: no chest pain, no fever, no headaches, no rash, no shortness of breath and no wheezing   Risk factors: no recent travel     Past Medical History  Diagnosis Date  . Schizophrenia     No past surgical history on file.  No family history on file.  History  Substance Use Topics  . Smoking status: Current Every Day Smoker -- 0.25 packs/day for 3 years    Types: Cigarettes  . Smokeless tobacco: Not on file  . Alcohol Use: 1.2 oz/week    1 Glasses of wine, 1 Cans of beer per week      Review of Systems  Constitutional: Negative for fever.  HENT: Negative for trouble swallowing.   Respiratory: Positive for cough. Negative for shortness of breath and wheezing.   Cardiovascular: Negative for chest pain.  Skin: Negative for itching and rash.  Neurological: Negative for headaches.  Psychiatric/Behavioral: Negative for suicidal ideas, hallucinations, sleep disturbance, self-injury, dysphoric mood and agitation. The patient is  not nervous/anxious.   All other systems reviewed and are negative.    Allergies  Haldol and Risperidone and related  Home Medications   Current Outpatient Rx  Name  Route  Sig  Dispense  Refill  . gabapentin (NEURONTIN) 600 MG tablet   Oral   Take 1 tablet (600 mg total) by mouth 2 (two) times daily. For anxiety/pain control   60 tablet   0   . lisinopril (PRINIVIL,ZESTRIL) 10 MG tablet   Oral   Take 1 tablet (10 mg total) by mouth daily. For high blood pressure control   30 tablet   0   . metoprolol tartrate (LOPRESSOR) 12.5 mg TABS   Oral   Take 0.5 tablets (12.5 mg total) by mouth 2 (two) times daily. For Hypertension/heart disease   30 tablet   0   . OLANZapine (ZYPREXA) 15 MG tablet   Oral   Take 1 tablet (15 mg total) by mouth 2 (two) times daily. For mood control   30 tablet   0   . traZODone (DESYREL) 50 MG tablet   Oral   Take 1 tablet (50 mg total) by mouth at bedtime. For depression/sleep   30 tablet   0     BP 154/89  Pulse 106  Temp(Src) 98.9 F (37.2 C) (Oral)  Resp 20  SpO2 93%  Physical Exam  Constitutional: He is oriented to person, place, and time. He appears well-developed and well-nourished. No distress.  HENT:  Head: Normocephalic and atraumatic.  Mouth/Throat: Oropharynx is clear and moist.  Eyes: Conjunctivae are normal. Pupils are equal, round,  and reactive to light.  Neck: Normal range of motion. Neck supple.  Cardiovascular: Normal rate, regular rhythm and intact distal pulses.   Pulmonary/Chest: Effort normal and breath sounds normal. He has no wheezes. He has no rales.  Abdominal: Soft. Bowel sounds are normal. There is no tenderness. There is no rebound and no guarding.  Musculoskeletal: Normal range of motion.  Neurological: He is alert and oriented to person, place, and time.  Skin: Skin is warm and dry.  Psychiatric: He has a normal mood and affect.    ED Course  Procedures (including critical care time)  Labs  Reviewed  CBC WITH DIFFERENTIAL   No results found.   No diagnosis found.    MDM  States that his medications are too strong and causing cough productive of sputum, patient clinically with COPD will treat as bronchitis   Symptoms are unrelated to psychiatric medications.  Have instructed patient to d/c smoking of any kind     Patient refused labs  Paton Crum K Samera Macy-Rasch, MD 07/25/12 1610  Jasmine Awe, MD 07/25/12 [phone]

## 2012-07-25 NOTE — ED Notes (Signed)
Per Pt: Pt states he believe he may be having a reaction to some of the medication that he has been prescribed. Pt states the medication has been making him talk to himself and causing him to cough up phlegm.

## 2013-03-06 ENCOUNTER — Encounter (HOSPITAL_COMMUNITY): Payer: Self-pay | Admitting: Emergency Medicine

## 2013-03-06 ENCOUNTER — Emergency Department (HOSPITAL_COMMUNITY)
Admission: EM | Admit: 2013-03-06 | Discharge: 2013-03-06 | Disposition: A | Discharge status: Home / Self Care | Payer: Medicaid Other | Attending: Emergency Medicine | Admitting: Emergency Medicine

## 2013-03-06 DIAGNOSIS — R519 Headache, unspecified: Secondary | ICD-10-CM

## 2013-03-06 DIAGNOSIS — Z8659 Personal history of other mental and behavioral disorders: Secondary | ICD-10-CM | POA: Insufficient documentation

## 2013-03-06 DIAGNOSIS — F17200 Nicotine dependence, unspecified, uncomplicated: Secondary | ICD-10-CM | POA: Insufficient documentation

## 2013-03-06 DIAGNOSIS — R51 Headache: Secondary | ICD-10-CM | POA: Insufficient documentation

## 2013-03-06 MED ORDER — ACETAMINOPHEN 500 MG PO TABS: 1000.0000 mg | ORAL_TABLET | Freq: Once | ORAL | Status: DC

## 2013-03-06 MED ORDER — KETOROLAC TROMETHAMINE 30 MG/ML IJ SOLN: 30.0000 mg | Freq: Once | INTRAMUSCULAR | Status: DC

## 2013-03-06 NOTE — ED Notes (Signed)
Pt refused meds; states he does not want shot and Tylenol does nothing for him; Pt states he needs his teeth fixed; Pt directed to contact dentist.

## 2013-03-06 NOTE — ED Provider Notes (Signed)
CSN: [id number]     Arrival date & time 03/06/13  0510 History   First MD Initiated Contact with Patient 03/06/13 [phone]     Chief Complaint  Patient presents with  . Migraine   (Consider location/radiation/quality/duration/timing/severity/associated sxs/prior Treatment) HPI This patient is a middle-aged schizophrenic man who presents with a complaint of headache. The patient initially said he had a headache but was gone. He then asked to watch the [employer] channel on television and get some food. He then changed his story and told me that he has a migraine headache. Although, he denies history of migraine headaches. He says it hurts all over. No recent head injury. No neurologic changes. No fever. Pain is moderately severe. Nothing excacerbates or relieves sx.   Past Medical History  Diagnosis Date  . Schizophrenia    History reviewed. No pertinent past surgical history. History reviewed. No pertinent family history. History  Substance Use Topics  . Smoking status: Current Every Day Smoker -- 0.25 packs/day for 3 years    Types: Cigarettes  . Smokeless tobacco: Not on file  . Alcohol Use: 1.2 oz/week    1 Glasses of wine, 1 Cans of beer per week    Review of Systems Ten point review of symptoms performed and is negative with the exception of symptoms noted above.   Allergies  Haldol and Risperidone and related  Home Medications  No current outpatient prescriptions on file. BP 126/79  Pulse 96  Temp(Src) 98.9 F (37.2 C) (Oral)  Resp 20  Ht 6' (1.829 m)  SpO2 96% Physical Exam Gen: well developed and well nourished appearing Head: NCAT Eyes: PERL, EOMI Nose: no epistaixis or rhinorrhea Mouth/throat: mucosa is moist and pink Neck: supple, no stridor Lungs: CTA B, no wheezing, rhonchi or rales CV: RRR, no murmur, extremities appear well perfused.  Abd: soft, notender,  Back: normal to inspection Skin: warm and dry Ext: normal to inspection, no dependent edema Neuro: CN  ii-xii grossly intact, no focal motor deficits, normal gait, normal coordination Psyche; odd affect  ED Course  Procedures (including critical care time)    MDM   Patient treated with Toradol and Tylenol. Anticipate discharge.     Brandt LoosenJulie Kamile Fassler, MD 03/06/13 [phone]

## 2013-03-06 NOTE — ED Notes (Signed)
Pt here from home with c/o migraine

## 2013-03-17 ENCOUNTER — Emergency Department (HOSPITAL_COMMUNITY)
Admission: EM | Admit: 2013-03-17 | Discharge: 2013-03-17 | Discharge status: Against Medical Advice | Payer: Medicaid Other | Attending: Emergency Medicine | Admitting: Emergency Medicine

## 2013-03-17 ENCOUNTER — Encounter (HOSPITAL_COMMUNITY): Payer: Self-pay | Admitting: Emergency Medicine

## 2013-03-17 DIAGNOSIS — R443 Hallucinations, unspecified: Secondary | ICD-10-CM | POA: Insufficient documentation

## 2013-03-17 DIAGNOSIS — R4789 Other speech disturbances: Secondary | ICD-10-CM | POA: Insufficient documentation

## 2013-03-17 DIAGNOSIS — F17200 Nicotine dependence, unspecified, uncomplicated: Secondary | ICD-10-CM | POA: Insufficient documentation

## 2013-03-17 DIAGNOSIS — Z8659 Personal history of other mental and behavioral disorders: Secondary | ICD-10-CM | POA: Insufficient documentation

## 2013-03-17 DIAGNOSIS — F1410 Cocaine abuse, uncomplicated: Secondary | ICD-10-CM | POA: Insufficient documentation

## 2013-03-17 DIAGNOSIS — F1210 Cannabis abuse, uncomplicated: Secondary | ICD-10-CM | POA: Insufficient documentation

## 2013-03-17 MED ORDER — LORAZEPAM 1 MG PO TABS: 1.0000 mg | ORAL_TABLET | Freq: Three times a day (TID) | ORAL | Status: DC | PRN

## 2013-03-17 NOTE — ED Provider Notes (Signed)
CSN: [id number]     Arrival date & time 03/17/13  0117 History   First MD Initiated Contact with Patient 03/17/13 [phone]     No chief complaint on file.  (Consider location/radiation/quality/duration/timing/severity/associated sxs/prior Treatment) HPI History provided by patient. History of schizophrenia and substance abuse, presents to the ED complaining of hallucinations. He has a history of crack cocaine and marijuana and alcohol use. He is currently not taking any medications, and is unable to say when he last took any prescribed medicines. He is having trouble describing his hallucinations, denies any suicidal or homicidal ideation. He has some pressured speech and is a difficult historian.    Past Medical History  Diagnosis Date  . Schizophrenia    History reviewed. No pertinent past surgical history. History reviewed. No pertinent family history. History  Substance Use Topics  . Smoking status: Current Every Day Smoker -- 0.25 packs/day for 3 years    Types: Cigarettes  . Smokeless tobacco: Not on file  . Alcohol Use: 1.2 oz/week    1 Glasses of wine, 1 Cans of beer per week    Review of Systems  Constitutional: Negative for fever and chills.  Respiratory: Negative for shortness of breath.   Cardiovascular: Negative for chest pain.  Gastrointestinal: Negative for abdominal pain.  Genitourinary: Negative for flank pain.  Musculoskeletal: Negative for back pain, neck pain and neck stiffness.  Skin: Negative for rash.  Neurological: Negative for weakness and numbness.  Psychiatric/Behavioral: Positive for hallucinations. Negative for self-injury.  All other systems reviewed and are negative.    Allergies  Haldol and Risperidone and related  Home Medications  No current outpatient prescriptions on file. BP 101/85  Pulse 60  Temp(Src) 98 F (36.7 C) (Oral)  Resp 16  Ht 6\' 1"  (1.854 m)  Wt 200 lb (90.719 kg)  BMI 26.39 kg/m2  SpO2 99% Physical Exam   Constitutional: He is oriented to person, place, and time. He appears well-developed and well-nourished.  HENT:  Head: Normocephalic and atraumatic.  Eyes: EOM are normal. Pupils are equal, round, and reactive to light.  Neck: Neck supple.  Cardiovascular: Normal rate, regular rhythm and intact distal pulses.   Pulmonary/Chest: Effort normal and breath sounds normal. No respiratory distress.  Musculoskeletal: Normal range of motion. He exhibits no edema.  Neurological: He is alert and oriented to person, place, and time.  Skin: Skin is warm and dry.  Psychiatric:  Hallucinations, pressured speech    ED Course  Procedures (including critical care time) Labs Review Labs Reviewed  CBC  BASIC METABOLIC PANEL  URINALYSIS, ROUTINE W REFLEX MICROSCOPIC  URINE RAPID DRUG SCREEN (HOSP PERFORMED)  ETHANOL   Imaging Review No results found.  EKG Interpretation   None       previous records reviewed, was discharged from behavioral health February 2014 with the following medication list :   gabapentin 600 MG tablet   Commonly known as: NEURONTIN   Take 1 tablet (600 mg total) by mouth 2 (two) times daily. For anxiety/pain control    Indication: Aggressive Behavior, Agitation, Pain    lisinopril 10 MG tablet   Commonly known as: PRINIVIL,ZESTRIL   Take 1 tablet (10 mg total) by mouth daily. For high blood pressure control    Indication: High Blood Pressure    metoprolol tartrate 12.5 mg Tabs   Commonly known as: LOPRESSOR   Take 0.5 tablets (12.5 mg total) by mouth 2 (two) times daily. For Hypertension/heart disease    Indication: High Blood  Pressure, Ischemic Heart Disease    OLANZapine 15 MG tablet   Commonly known as: ZYPREXA   Take 1 tablet (15 mg total) by mouth 2 (two) times daily. For mood control    Indication: Schizophrenia    traZODone 50 MG tablet   Commonly known as: DESYREL   Take 1 tablet (50 mg total) by mouth at bedtime. For depression/sleep    TTS/  PSY consult requested  Patient declining blood work, wants something to eat, reports he is no longer having hallucinations and would like to leave AMA. No indication for IVC at this time.   MDM  Dx: Hallucinations ? resolved, Medical noncompliance, h/o Schizophrenia, h/o substance abuse  Previous records reviewed. Serial evaluations. Vital signs and nursing notes reviewed and considered.  Sunnie NielsenBrian Xaivier Malay, MD 03/17/13 [phone]

## 2013-03-17 NOTE — ED Notes (Signed)
Patient refused lab work. RN made aware. 

## 2013-03-17 NOTE — ED Notes (Signed)
Pt refused blood draw and decided to leave AMA. RN Wilmer FloorLisa A and EDP Dierdre Highmanpitz notified

## 2013-03-17 NOTE — ED Notes (Signed)
EDP Opitz at bedside

## 2013-03-17 NOTE — ED Notes (Addendum)
Pt reported to registration staff that he was hallucinating, when in triage pt reported he was here for "nothing" when questioned further pt stated he was having "women problem" that was stressing him out. Pt reports using THC and crack cocaine and drinking ETOH, denies SI/HI, pt appears to be distracted, poor eye contact and difficult to understand at times. Pt calm and cooperative, pt noted to be drifting off to sleep during triage.

## 2013-03-22 ENCOUNTER — Encounter (HOSPITAL_COMMUNITY): Payer: Self-pay | Admitting: Behavioral Health

## 2013-03-22 ENCOUNTER — Emergency Department (EMERGENCY_DEPARTMENT_HOSPITAL)
Admission: EM | Admit: 2013-03-22 | Discharge: 2013-03-22 | Disposition: A | Discharge status: Other Acute Inpatient Hospital | Payer: Medicare Other | Source: Home / Self Care

## 2013-03-22 ENCOUNTER — Inpatient Hospital Stay (HOSPITAL_COMMUNITY)
Admission: AD | Admit: 2013-03-22 | Discharge: 2013-04-04 | DRG: 885 | Disposition: A | Discharge status: Home / Self Care | Payer: Medicare Other | Source: Intra-hospital | Attending: Psychiatry | Admitting: Psychiatry

## 2013-03-22 ENCOUNTER — Encounter (HOSPITAL_COMMUNITY): Payer: Self-pay | Admitting: Emergency Medicine

## 2013-03-22 DIAGNOSIS — I10 Essential (primary) hypertension: Secondary | ICD-10-CM | POA: Diagnosis present

## 2013-03-22 DIAGNOSIS — F39 Unspecified mood [affective] disorder: Secondary | ICD-10-CM | POA: Insufficient documentation

## 2013-03-22 DIAGNOSIS — F29 Unspecified psychosis not due to a substance or known physiological condition: Secondary | ICD-10-CM | POA: Diagnosis present

## 2013-03-22 DIAGNOSIS — F259 Schizoaffective disorder, unspecified: Secondary | ICD-10-CM | POA: Diagnosis present

## 2013-03-22 DIAGNOSIS — G4700 Insomnia, unspecified: Secondary | ICD-10-CM | POA: Diagnosis present

## 2013-03-22 DIAGNOSIS — F1420 Cocaine dependence, uncomplicated: Secondary | ICD-10-CM | POA: Diagnosis present

## 2013-03-22 DIAGNOSIS — IMO0002 Reserved for concepts with insufficient information to code with codable children: Secondary | ICD-10-CM

## 2013-03-22 DIAGNOSIS — F411 Generalized anxiety disorder: Secondary | ICD-10-CM | POA: Diagnosis present

## 2013-03-22 DIAGNOSIS — F1220 Cannabis dependence, uncomplicated: Secondary | ICD-10-CM | POA: Diagnosis present

## 2013-03-22 DIAGNOSIS — F1910 Other psychoactive substance abuse, uncomplicated: Secondary | ICD-10-CM

## 2013-03-22 DIAGNOSIS — F17200 Nicotine dependence, unspecified, uncomplicated: Secondary | ICD-10-CM

## 2013-03-22 DIAGNOSIS — Z9119 Patient's noncompliance with other medical treatment and regimen: Secondary | ICD-10-CM

## 2013-03-22 DIAGNOSIS — Z91199 Patient's noncompliance with other medical treatment and regimen due to unspecified reason: Secondary | ICD-10-CM

## 2013-03-22 DIAGNOSIS — F319 Bipolar disorder, unspecified: Secondary | ICD-10-CM | POA: Diagnosis present

## 2013-03-22 DIAGNOSIS — F209 Schizophrenia, unspecified: Secondary | ICD-10-CM

## 2013-03-22 DIAGNOSIS — F1410 Cocaine abuse, uncomplicated: Secondary | ICD-10-CM

## 2013-03-22 DIAGNOSIS — F913 Oppositional defiant disorder: Secondary | ICD-10-CM | POA: Diagnosis present

## 2013-03-22 LAB — RAPID URINE DRUG SCREEN, HOSP PERFORMED
AMPHETAMINES: NOT DETECTED
BENZODIAZEPINES: NOT DETECTED
Barbiturates: NOT DETECTED
Cocaine: POSITIVE — AB
OPIATES: NOT DETECTED
Tetrahydrocannabinol: POSITIVE — AB

## 2013-03-22 LAB — COMPREHENSIVE METABOLIC PANEL WITH GFR
ALT: 22 U/L (ref 0–53)
AST: 31 U/L (ref 0–37)
Albumin: 4 g/dL (ref 3.5–5.2)
Alkaline Phosphatase: 109 U/L (ref 39–117)
BUN: 14 mg/dL (ref 6–23)
CO2: 26 meq/L (ref 19–32)
Calcium: 9.1 mg/dL (ref 8.4–10.5)
Chloride: 104 meq/L (ref 96–112)
Creatinine, Ser: 0.71 mg/dL (ref 0.50–1.35)
GFR calc Af Amer: >90 mL/min (ref >90)
GFR calc non Af Amer: >90 mL/min (ref >90)
Glucose, Bld: 104 mg/dL — ABNORMAL HIGH (ref 70–99)
Potassium: 4.3 meq/L (ref 3.7–5.3)
Sodium: 141 meq/L (ref 137–147)
Total Bilirubin: <0.2 mg/dL — ABNORMAL LOW (ref 0.3–1.2)
Total Protein: 7.1 g/dL (ref 6.0–8.3)

## 2013-03-22 LAB — CBC
HCT: 39.3 % (ref 39.0–52.0)
Hemoglobin: 13 g/dL (ref 13.0–17.0)
MCH: 29.8 pg (ref 26.0–34.0)
MCHC: 33.1 g/dL (ref 30.0–36.0)
MCV: 90.1 fL (ref 78.0–100.0)
Platelets: 328 10*3/uL (ref 150–400)
RBC: 4.36 MIL/uL (ref 4.22–5.81)
RDW: 14.8 % (ref 11.5–15.5)
WBC: 8.9 10*3/uL (ref 4.0–10.5)

## 2013-03-22 LAB — SALICYLATE LEVEL

## 2013-03-22 LAB — ACETAMINOPHEN LEVEL: Acetaminophen (Tylenol), Serum: <15 ug/mL (ref 10–30)

## 2013-03-22 LAB — ETHANOL: Alcohol, Ethyl (B): <11 mg/dL (ref 0–11)

## 2013-03-22 MED ORDER — ACETAMINOPHEN 325 MG PO TABS: 650.0000 mg | ORAL_TABLET | Freq: Four times a day (QID) | ORAL | Status: DC | PRN

## 2013-03-22 MED ORDER — ZIPRASIDONE MESYLATE 20 MG IM SOLR
10.0000 mg | Freq: Once | INTRAMUSCULAR | Status: AC
  Administered 2013-03-22: 10 mg via INTRAMUSCULAR
  Filled 2013-03-22: qty 20

## 2013-03-22 MED ORDER — OLANZAPINE 5 MG PO TABS: 15.0000 mg | ORAL_TABLET | Freq: Two times a day (BID) | ORAL | Status: DC

## 2013-03-22 MED ORDER — GABAPENTIN 300 MG PO CAPS
600.0000 mg | ORAL_CAPSULE | Freq: Two times a day (BID) | ORAL | Status: DC
  Administered 2013-03-22 – 2013-03-27 (×6): 600 mg via ORAL
  Administered 2013-03-28: 300 mg via ORAL
  Filled 2013-03-22 (×30): qty 2

## 2013-03-22 MED ORDER — LORAZEPAM 2 MG/ML IJ SOLN
2.0000 mg | Freq: Once | INTRAMUSCULAR | Status: DC
  Administered 2013-03-22: 2 mg via INTRAMUSCULAR
  Filled 2013-03-22: qty 1

## 2013-03-22 MED ORDER — OLANZAPINE 10 MG PO TABS
10.0000 mg | ORAL_TABLET | Freq: Two times a day (BID) | ORAL | Status: DC
  Administered 2013-03-22 – 2013-03-24 (×3): 10 mg via ORAL
  Filled 2013-03-22 (×7): qty 1

## 2013-03-22 MED ORDER — ALUM & MAG HYDROXIDE-SIMETH 200-200-20 MG/5ML PO SUSP: 30.0000 mL | ORAL | Status: DC | PRN

## 2013-03-22 MED ORDER — LISINOPRIL 10 MG PO TABS
10.0000 mg | ORAL_TABLET | Freq: Every day | ORAL | Status: DC
  Filled 2013-03-22 (×7): qty 1

## 2013-03-22 MED ORDER — LORAZEPAM 2 MG/ML IJ SOLN: 1.0000 mg | Freq: Once | INTRAMUSCULAR | Status: AC | PRN

## 2013-03-22 MED ORDER — GABAPENTIN 300 MG PO CAPS
600.0000 mg | ORAL_CAPSULE | Freq: Two times a day (BID) | ORAL | Status: DC
  Administered 2013-03-22: 600 mg via ORAL
  Filled 2013-03-22 (×3): qty 2

## 2013-03-22 MED ORDER — OLANZAPINE 10 MG PO TABS
10.0000 mg | ORAL_TABLET | Freq: Two times a day (BID) | ORAL | Status: DC
  Administered 2013-03-22: 10 mg via ORAL
  Filled 2013-03-22: qty 1

## 2013-03-22 MED ORDER — DIPHENHYDRAMINE HCL 50 MG/ML IJ SOLN
50.0000 mg | Freq: Once | INTRAMUSCULAR | Status: AC
  Administered 2013-03-22: 50 mg via INTRAMUSCULAR
  Filled 2013-03-22: qty 1

## 2013-03-22 MED ORDER — LISINOPRIL 10 MG PO TABS
10.0000 mg | ORAL_TABLET | Freq: Every day | ORAL | Status: DC
  Administered 2013-03-22: 10 mg via ORAL
  Filled 2013-03-22 (×2): qty 1

## 2013-03-22 MED ORDER — TRAZODONE HCL 50 MG PO TABS
50.0000 mg | ORAL_TABLET | Freq: Every day | ORAL | Status: DC
  Filled 2013-03-22: qty 1

## 2013-03-22 MED ORDER — MAGNESIUM HYDROXIDE 400 MG/5ML PO SUSP: 30.0000 mL | Freq: Every day | ORAL | Status: DC | PRN

## 2013-03-22 MED ORDER — TRAZODONE HCL 50 MG PO TABS
50.0000 mg | ORAL_TABLET | Freq: Every day | ORAL | Status: DC
  Administered 2013-03-22: 50 mg via ORAL
  Filled 2013-03-22 (×3): qty 1

## 2013-03-22 NOTE — ED Notes (Signed)
Patient denies SI, HI. Disorganized thoughts, FOI. Labile. States he doesn't believe his brother had him brought in. States his brother is trying to get him to take out dated medications when offered ordered meds. Asked for his lawyer. Asked to sign a medication refusal.   Encouragement offered. Snack given.  Patient safety maintained. Q 15 checks in place.

## 2013-03-22 NOTE — ED Provider Notes (Signed)
CSN: [id number]     Arrival date & time 03/22/13  0058 History   First MD Initiated Contact with Patient 03/22/13 0116     Chief Complaint  Patient presents with  . Medical Clearance   (Consider location/radiation/quality/duration/timing/severity/associated sxs/prior Treatment) The history is provided by the patient and the police. The history is limited by the condition of the patient.  pt with hx schizophrenia, presents via gpd w ivc papers. pts family had reported non compliance w meds, and increasingly bizarre/irrational behavior, including paranoid thought processes, and appearing agitated and aggressive at times.   Pt uncooperative w hx/ros, answering very few questions, saying very little - level 5 caveat.     Past Medical History  Diagnosis Date  . Schizophrenia    History reviewed. No pertinent past surgical history. History reviewed. No pertinent family history. History  Substance Use Topics  . Smoking status: Current Every Day Smoker -- 0.25 packs/day for 3 years    Types: Cigarettes  . Smokeless tobacco: Not on file  . Alcohol Use: 1.2 oz/week    1 Glasses of wine, 1 Cans of beer per week    Review of Systems  Unable to perform ROS: Psychiatric disorder  Constitutional: Negative for fever.  Neurological: Negative for headaches.  Psychiatric/Behavioral: Positive for dysphoric mood and agitation.  level 5 caveat   Allergies  Haldol and Risperidone and related  Home Medications  No current outpatient prescriptions on file. BP 153/77  Pulse 95  Temp(Src) 98.1 F (36.7 C) (Oral)  Resp 18  SpO2 98% Physical Exam  Nursing note and vitals reviewed. Constitutional: He appears well-developed and well-nourished. No distress.  HENT:  Head: Atraumatic.  Mouth/Throat: Oropharynx is clear and moist.  Eyes: Conjunctivae are normal. Pupils are equal, round, and reactive to light. No scleral icterus.  Neck: Normal range of motion. Neck supple. No tracheal deviation  present. No thyromegaly present.  No stiffness or rigidity  Cardiovascular: Normal rate, regular rhythm, normal heart sounds and intact distal pulses.   Pulmonary/Chest: Effort normal and breath sounds normal. No accessory muscle usage. No respiratory distress.  Abdominal: Soft. Bowel sounds are normal. He exhibits no distension. There is no tenderness.  Musculoskeletal: Normal range of motion. He exhibits no edema and no tenderness.  Neurological: He is alert.  Alert, occasionally responds to questions w inappropriate, brief, 1-2 word answers, not specifically addressing question answered. Moves bil extremities purposefully. Ambulates w steady gait.   Skin: Skin is warm and dry. No rash noted. He is not diaphoretic.  Psychiatric:  Witthdrawn. Closes eyes when asked questions. Occasionally w blurt out a couple word phrases. Non compliant w exam    ED Course  Procedures (including critical care time)  Results for orders placed during the hospital encounter of 03/22/13  ACETAMINOPHEN LEVEL      Result Value Range   Acetaminophen (Tylenol), Serum <15.0  10 - 30 ug/mL  CBC      Result Value Range   WBC 8.9  4.0 - 10.5 K/uL   RBC 4.36  4.22 - 5.81 MIL/uL   Hemoglobin 13.0  13.0 - 17.0 g/dL   HCT 40.939.3  81.139.0 - 91.452.0 %   MCV 90.1  78.0 - 100.0 fL   MCH 29.8  26.0 - 34.0 pg   MCHC 33.1  30.0 - 36.0 g/dL   RDW 78.214.8  95.611.5 - 21.315.5 %   Platelets 328  150 - 400 K/uL  COMPREHENSIVE METABOLIC PANEL      Result Value  Range   Sodium 141  137 - 147 mEq/L   Potassium 4.3  3.7 - 5.3 mEq/L   Chloride 104  96 - 112 mEq/L   CO2 26  19 - 32 mEq/L   Glucose, Bld 104 (*) 70 - 99 mg/dL   BUN 14  6 - 23 mg/dL   Creatinine, Ser 4.09  0.50 - 1.35 mg/dL   Calcium 9.1  8.4 - 81.1 mg/dL   Total Protein 7.1  6.0 - 8.3 g/dL   Albumin 4.0  3.5 - 5.2 g/dL   AST 31  0 - 37 U/L   ALT 22  0 - 53 U/L   Alkaline Phosphatase 109  39 - 117 U/L   Total Bilirubin <0.2 (*) 0.3 - 1.2 mg/dL   GFR calc non Af Amer >90   >90 mL/min   GFR calc Af Amer >90  >90 mL/min  ETHANOL      Result Value Range   Alcohol, Ethyl (B) <11  0 - 11 mg/dL  SALICYLATE LEVEL      Result Value Range   Salicylate Lvl <2.0 (*) 2.8 - 20.0 mg/dL  URINE RAPID DRUG SCREEN (HOSP PERFORMED)      Result Value Range   Opiates NONE DETECTED  NONE DETECTED   Cocaine POSITIVE (*) NONE DETECTED   Benzodiazepines NONE DETECTED  NONE DETECTED   Amphetamines NONE DETECTED  NONE DETECTED   Tetrahydrocannabinol POSITIVE (*) NONE DETECTED   Barbiturates NONE DETECTED  NONE DETECTED      MDM  Labs.  Psych team consulted.  Reviewed nursing notes and prior charts for additional history.   Recheck calm and alert. Psych eval and dispo pending.       Suzi Roots, MD 03/22/13 [phone]
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
## 2013-03-22 NOTE — ED Notes (Signed)
Patient is restless and pacing.  States "wants a cigarette".  Offered patch and nicorette gum which he refuses.  Irritable but redirectable.
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
## 2013-03-22 NOTE — ED Notes (Signed)
Bed: WA09 Expected date:  Expected time:  Means of arrival:  Comments: 

## 2013-03-22 NOTE — Progress Notes (Signed)
Admission Note: 48 y/o male who presents involuntarily for psychosis, medications non compliance. Patient appears disorganized and speaks about being arrested by police and being brought here.  Patient states he called police because of insurance fraud but he ended up getting arrested.  Patient denies SI/HI and denies AVH.  Patient states he has been taking his medications but states the medications were expired.  Patient denies SI/HI and denies AVH.  Writer does visualize patient talking to himself frequently. Patient is poor historian as far as health and surgical history.  Skin search done by admitting RN Cato MulliganEric Jensen.  Patient cooperative during assessment and admission process tonight. Consents obtained, fall safety plan explained and patient verbalized understanding.  Patient offered no additional questions or concerns.

## 2013-03-22 NOTE — [facility] Group Notes (Signed)
Adult Psychoeducational Group Note  Date:  03/22/2013 Time:  9:15 PM  Group Topic/Focus:  Wrap-Up Group:   The focus of this group is to help patients review their daily goal of treatment and discuss progress on daily workbooks.  Participation Level:  Active  Participation Quality:  Appropriate  Affect:  Appropriate  Cognitive:  Confused  Insight: Limited  Engagement in Group:  Engaged  Modes of Intervention:  Discussion  Additional Comments:  Gregory LoneGilbert talked about Meza police report and being brought here.  He stated he was too sedated and to make sure his medication isn't too high.  Gregory RancherLindsay, Gregory Meza 03/22/2013, 9:15 PM

## 2013-03-22 NOTE — Progress Notes (Signed)
CSW attempted to obtain collateral information from pt brother. Pt brother is listed on ivc Gregory Meza. CSW called number provided in pt chart, for brother however unable to leave a message due to mailbox being full.   Byrd HesselbachKristen Bruce Churilla, LCSW [phone]  ED CSW 03/22/2013 11:19am

## 2013-03-22 NOTE — Tx Team (Signed)
Initial Interdisciplinary Treatment Plan  PATIENT STRENGTHS: (choose at least two) Communication skills Religious Affiliation  PATIENT STRESSORS: Financial difficulties Legal issue Medication change or noncompliance Substance abuse   PROBLEM LIST: Problem List/Patient Goals Date to be addressed Date deferred Reason deferred Estimated date of resolution  Medications noncompliance      psychosis                                                 DISCHARGE CRITERIA:  Improved stabilization in mood, thinking, and/or behavior Need for constant or close observation no longer present Verbal commitment to aftercare and medication compliance  PRELIMINARY DISCHARGE PLAN: Attend aftercare/continuing care group Return to previous living arrangement  PATIENT/FAMIILY INVOLVEMENT: This treatment plan has been presented to and reviewed with the patient, Gregory Meza.  The patient and family have been given the opportunity to ask questions and make suggestions.  Angeline SlimHill, Ashley M 03/22/2013, 7:53 PM

## 2013-03-22 NOTE — Progress Notes (Signed)
Patient awake, agitated. Pacing unit. Continued to refuse PO medications. Patient agitation escalated; cursing, appears to be responding to internal stimuli.   Encouragement offered. Patient remained upset, verbally aggressive. Security called. Patient in room 36. Given IM Benadryl Ativan and Geodon. Remained in room 36.  Patient safety maintained. Q 15 checks in place.

## 2013-03-22 NOTE — BH Assessment (Signed)
[facility] for [facility] treatment by Assunta FoundShuvon Rankin, NP assigned to bed 404-1. Admission Checklist complete, and IVC paperwork in pt's chart to include First Examination and Recommendation, Affidavit and Petition, and Findings and Custody, Pt refusing to sign ROI.   Glorious PeachNajah Shatori Bertucci, MS, LCASA Assessment Counselor

## 2013-03-23 DIAGNOSIS — F1220 Cannabis dependence, uncomplicated: Secondary | ICD-10-CM

## 2013-03-23 DIAGNOSIS — F209 Schizophrenia, unspecified: Secondary | ICD-10-CM

## 2013-03-23 DIAGNOSIS — F1420 Cocaine dependence, uncomplicated: Secondary | ICD-10-CM | POA: Diagnosis present

## 2013-03-23 MED ORDER — TRAZODONE HCL 100 MG PO TABS
100.0000 mg | ORAL_TABLET | Freq: Every day | ORAL | Status: DC
  Filled 2013-03-23 (×2): qty 1
  Filled 2013-03-23: qty 3
  Filled 2013-03-23 (×4): qty 1
  Filled 2013-03-23: qty 3
  Filled 2013-03-23 (×7): qty 1

## 2013-03-23 MED ORDER — LACTINEX PO CHEW
1.0000 | CHEWABLE_TABLET | Freq: Three times a day (TID) | ORAL | Status: DC
  Administered 2013-03-23: 1 via ORAL
  Filled 2013-03-23 (×2): qty 1

## 2013-03-23 MED ORDER — LACTINEX PO CHEW
1.0000 | CHEWABLE_TABLET | Freq: Three times a day (TID) | ORAL | Status: DC
  Administered 2013-03-23 (×3): 1 via ORAL
  Filled 2013-03-23 (×44): qty 1
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
## 2013-03-23 NOTE — Progress Notes (Signed)
The focus of this group is to educate the patient on the purpose and policies of crisis stabilization and provide a format to answer questions about their admission.  The group details unit policies and expectations of patients while admitted.  Patient attended 0900 nurse education orientation group this morning, patient got up and left group.  Patient talked to himself while he was leaving group.  Patient did not participate and did not engage in group appropriately.

## 2013-03-23 NOTE — Progress Notes (Addendum)
D:  Patient's self inventory sheet, patient stated he sleeps well, improving appetite, normal energy level, poor attention span.  Denied SI and HI.  Denied A/V hallucinations.  Denied pain.   A:  Refused lisinopril and zyprexa this morning.  Emotional support and encouragement given patient. R:  Denied SI and HI.  Denied A/V hallucinations.  Denied pain.  Will continue to monitor patient for safety with 15 minute checks.  Safety maintained. Patient has been talking  to himself, walking and talking in hallway and in dayroom.  Left during morning group talking to himself.  Patient safe and will continue to monitor.  Patient appears to be religiously preoccupied.

## 2013-03-23 NOTE — Consult Note (Signed)
Face to face evaluation, agree with evaluation 

## 2013-03-23 NOTE — Progress Notes (Signed)
The focus of this group is to help patients review their daily goal of treatment and discuss progress on daily workbooks. Pt was in and out of group. Pt made no contributions to group discussion.

## 2013-03-23 NOTE — Progress Notes (Signed)
Patient ID: Gregory FriesGilbert J Ludvigsen, male   DOB: 12-16-1965, 48 y.o.   MRN: [id number] Pt is agitated yelling, cursing demanding food. Writer offered snacks. Pt asked if  he take his medications to go to bed. Writer explained to pt the scheduled times for his medications. Pt has been increasing getting agitated using profanity towards wrriter. Verbal de-escalation to help pt calm down. Pt is in room awaiting medications at scheduled time. Writer offered to bring pt his medication.

## 2013-03-23 NOTE — [facility] Group Notes (Signed)
[facility] Group Notes:  (Counselor/Nursing/MHT/Case Management/Adjunct)  03/23/2013 1:15PM  Type of Therapy:  Group Therapy  Participation Level:  Active  Participation Quality: Rambling, Intrusive, often incoherent  Affect:  Flat  Cognitive:  Oriented  Insight:  Improving  Engagement in Group:  Limited  Engagement in Therapy:  Limited  Modes of Intervention:  Discussion, Exploration and Socialization  Summary of Progress/Problems: The topic for group was balance in life.  Pt participated in the discussion about when their life was in balance and out of balance and how this feels.  Pt discussed ways to get back in balance and short term goals they can work on to get where they want to be. Much speaking initially.  Somewhat related to subject at hand, but difficult to follow.  Got up after 8 minutes and left. Tried to come back in but I barred the door.  Told him he could not return until group was over.   Ida Rogueorth, Vernel Langenderfer B 03/23/2013 12:53 PM

## 2013-03-23 NOTE — [facility] Counselor (Signed)
Adult Psychosocial Assessment Update Interdisciplinary Team  [facility]  Admissions Discharges  Date:  03/28/12 Date:  Date: Date:  Date: Date:  Date: Date:  Date: Date:   Changes since the last Psychosocial Assessment (including adherence to outpatient mental health and/or substance abuse treatment, situational issues contributing to decompensation and/or relapse). The following info is courtesy of [facility] ACT team and [facility] worker Milinda AntisBarbara Dickens at [phone].  [name] has been with [facility] on and off for about 8 years.  [facility]  After getting out, he was mostly compliant with the ACT team for 4-5 months.  He then began refusing services and ended up in jail for 3 months, where he decompensated.  He again refused ACT services when he got out, saying he preferred a day program.  He has housing through [employer] and lives with his brother, who tries to make sure he is taking his meds, with mixed results, obviously.  Also, he has been using cocaine and cannabis, which has been an issue for EvartGilbert for many years. His brother IVCed.             Discharge Plan 1. Will you be returning to the same living situation after discharge?   Yes:X home No:      If no, what is your plan?           2. Would you like a referral for services when you are discharged? Yes: X    If yes, for what services?  No:       Will tell [name] we are referring him to ACT team, but give him a choice about returning to same team, or trying another one.       Summary and Recommendations (to be completed by the evaluator) [name] is a 48 YO AA male who has a severe and persistent mental illness in conjunction with a significant substance abuse problem as well.  He can benefit from crises stabilization, medication management, therapeutic milieu and referral for services.                       Signature:  Ida Rogueorth, Fransheska Willingham B,  03/23/2013 5:17 PM

## 2013-03-23 NOTE — H&P (Signed)
Psychiatric Admission Assessment Adult  Patient Identification:  AMANDEEP NESMITH Date of Evaluation:  03/23/2013 Chief Complaint:  "I don't know why my brother committed me."  History of Present Illness::  Gregory Meza is a 48 year old male who presented to [facility] under IVC, which was initiated by the patient's brother. The history has been limited by the acuity of the patient's condition. His speech has been difficult to understand due to being very pressured and garbled. Meade becomes very agitated when trying to explain what brought him to the hospital stating "My brother committed me. He is my Engineer, water. The police reported me about some tax issues." The patient's chart was reviewed for pertinent information. Giovan was extremely agitated with staff at [facility] as the notes indicate he was cursing and demanding that they get out of his room. The collateral information that was obtained indicates that when patientis off his medications that he exhibits paranoia and irrational behavior. He has been observed talking to himself while referring to demons. Patient shows no insight into his mental illness with recent refusals of Zyprexa and Lisinopril. Treavor has been observed talking to himself and making delusional comments to his peers. Patient may require a second opinion for forced medications if he continues to refuse his medications. Dream became angry today when asked why he was refusing his blood pressure medicine stating "I am healthy! I never eat chitlins. Nothing is wrong with me!" The patient continued to ruminate on this topic requiring mild verbal de escalation from writer to calm down.   Elements:  Location:  Psychosis . Quality:  Agitation, assaultive behavior, delusions . Severity:  Severe . Timing:  Last few weeks. Duration:  chronic . Context:  Medication noncompliance, substance abuse . Associated Signs/Synptoms: Depression Symptoms:  psychomotor agitation, anxiety, (Hypo) Manic  Symptoms:  Delusions, Distractibility, Irritable Mood, Labiality of Mood, Anxiety Symptoms:  Denies  Psychotic Symptoms:  Delusions, Hallucinations: Auditory PTSD Symptoms: Negative Total Time spent with patient: 30 minutes  Psychiatric Specialty Exam: Physical Exam  Constitutional:  Physical exam findings reviewed from the [facility] on 03/22/13 and I concur with findings with no noted exceptions.   Psychiatric: His mood appears anxious. His affect is angry and labile. His speech is rapid and/or pressured. He is agitated, hyperactive and actively hallucinating. Thought content is delusional.    Review of Systems  Constitutional: Negative.   HENT: Negative.   Eyes: Negative.   Respiratory: Negative.   Cardiovascular: Negative.   Gastrointestinal: Negative.   Genitourinary: Negative.   Musculoskeletal: Negative.   Skin: Negative.   Neurological: Negative.   Endo/Heme/Allergies: Negative.   Psychiatric/Behavioral: Positive for hallucinations and substance abuse. Negative for depression, suicidal ideas and memory loss. The patient is nervous/anxious and has insomnia.     Blood pressure 147/90, pulse 66, temperature 97.7 F (36.5 C), temperature source Oral, resp. rate 16, height 6' 1"  (1.854 m), weight 86.637 kg (191 lb).Body mass index is 25.2 kg/(m^2).  General Appearance: Disheveled  Eye Sport and exercise psychologist::  Fair  Speech:  Garbled and Pressured  Volume:  Increased  Mood:  Irritable  Affect:  Full Range  Thought Process:  Circumstantial and Disorganized  Orientation:  Other:  Only to place and person   Thought Content:  Delusions and Hallucinations: Auditory  Suicidal Thoughts:  No  Homicidal Thoughts:  No  Memory:  Immediate;   Fair Recent;   Poor Remote;   Poor  Judgement:  Poor  Insight:  Lacking  Psychomotor Activity:  Increased  Concentration:  Poor  Recall:  Poor  Fund of Knowledge:Poor  Language: Fair  Akathisia:  No  Handed:  Right  AIMS (if indicated):     Assets:   Desire for Improvement Leisure Time Physical Health  Sleep:  Number of Hours: 4.75    Musculoskeletal: Strength & Muscle Tone: within normal limits Gait & Station: normal Patient leans: N/A  Past Psychiatric History:Yes  Diagnosis:Schizophrenia   Hospitalizations: [facility] 2014   Outpatient Care:Noncompliant   Substance Abuse Care:Unknown   Self-Mutilation:Denies   Suicidal Attempts:Denies   Violent Behaviors:Denies    Past Medical History:   Past Medical History  Diagnosis Date  . Schizophrenia    None. Allergies:   Allergies  Allergen Reactions  . Haldol [Haloperidol]     Pt reports he is allergic to injectable and PO haldol  . Risperidone And Related     Pt reports he is allergic to injectable and PO    PTA Medications: Prescriptions prior to admission  Medication Sig Dispense Refill  . cetirizine (ZYRTEC) 10 MG tablet Take 10 mg by mouth daily.        Previous Psychotropic Medications:  Medication/Dose  Zyprexa 20 mg BID   Neurontin 600 mg BID   Lithium 1200 mg hs   Prolixin 15 mg every am and 20 mg at hs          Substance Abuse History in the last 12 months:  yes  Consequences of Substance Abuse: Possible worsening of mental health symptoms due to cocaine and marijuana abuse   Social History:  reports that he has been smoking Cigarettes.  He has a .75 pack-year smoking history. He does not have any smokeless tobacco history on file. He reports that he drinks about 1.2 ounces of alcohol per week. He reports that he uses illicit drugs ("Crack" cocaine and Marijuana) about 5 times per week. Additional Social History:                      Current Place of Residence:   Place of Birth:   Family Members: Marital Status:  Single Children:  Sons:  Daughters: Relationships: Education:  "I went through the tenth grade."  Educational Problems/Performance: Religious Beliefs/Practices: History of Abuse (Emotional/Phsycial/Sexual) Occupational  Experiences; Military History:  None. Legal History: Hobbies/Interests:  Family History:  History reviewed. No pertinent family history. Patient angrily denies knowledge of any mental health problems in his family.   Results for orders placed during the hospital encounter of 03/22/13 (from the past 72 hour(s))  ACETAMINOPHEN LEVEL     Status: None   Collection Time    03/22/13  1:40 AM      Result Value Range   Acetaminophen (Tylenol), Serum <15.0  10 - 30 ug/mL   Comment:            THERAPEUTIC CONCENTRATIONS VARY     SIGNIFICANTLY. A RANGE OF 10-30     ug/mL MAY BE AN EFFECTIVE     CONCENTRATION FOR MANY PATIENTS.     HOWEVER, SOME ARE BEST TREATED     AT CONCENTRATIONS OUTSIDE THIS     RANGE.     ACETAMINOPHEN CONCENTRATIONS     >150 ug/mL AT 4 HOURS AFTER     INGESTION AND >50 ug/mL AT 12     HOURS AFTER INGESTION ARE     OFTEN ASSOCIATED WITH TOXIC     REACTIONS.  CBC     Status: None   Collection Time    03/22/13  1:40 AM      Result Value Range   WBC 8.9  4.0 - 10.5 K/uL   RBC 4.36  4.22 - 5.81 MIL/uL   Hemoglobin 13.0  13.0 - 17.0 g/dL   HCT 39.3  39.0 - 52.0 %   MCV 90.1  78.0 - 100.0 fL   MCH 29.8  26.0 - 34.0 pg   MCHC 33.1  30.0 - 36.0 g/dL   RDW 14.8  11.5 - 15.5 %   Platelets 328  150 - 400 K/uL  COMPREHENSIVE METABOLIC PANEL     Status: Abnormal   Collection Time    03/22/13  1:40 AM      Result Value Range   Sodium 141  137 - 147 mEq/L   Potassium 4.3  3.7 - 5.3 mEq/L   Chloride 104  96 - 112 mEq/L   CO2 26  19 - 32 mEq/L   Glucose, Bld 104 (*) 70 - 99 mg/dL   BUN 14  6 - 23 mg/dL   Creatinine, Ser 0.71  0.50 - 1.35 mg/dL   Calcium 9.1  8.4 - 10.5 mg/dL   Total Protein 7.1  6.0 - 8.3 g/dL   Albumin 4.0  3.5 - 5.2 g/dL   AST 31  0 - 37 U/L   Comment: NO VISIBLE HEMOLYSIS   ALT 22  0 - 53 U/L   Alkaline Phosphatase 109  39 - 117 U/L   Total Bilirubin <0.2 (*) 0.3 - 1.2 mg/dL   GFR calc non Af Amer >90  >90 mL/min   GFR calc Af Amer >90  >90  mL/min   Comment: (NOTE)     The eGFR has been calculated using the CKD EPI equation.     This calculation has not been validated in all clinical situations.     eGFR's persistently <90 mL/min signify possible Chronic Kidney     Disease.  ETHANOL     Status: None   Collection Time    03/22/13  1:40 AM      Result Value Range   Alcohol, Ethyl (B) <11  0 - 11 mg/dL   Comment:            LOWEST DETECTABLE LIMIT FOR     SERUM ALCOHOL IS 11 mg/dL     FOR MEDICAL PURPOSES ONLY  SALICYLATE LEVEL     Status: Abnormal   Collection Time    03/22/13  1:40 AM      Result Value Range   Salicylate Lvl <7.0 (*) 2.8 - 20.0 mg/dL  URINE RAPID DRUG SCREEN (HOSP PERFORMED)     Status: Abnormal   Collection Time    03/22/13  2:05 AM      Result Value Range   Opiates NONE DETECTED  NONE DETECTED   Cocaine POSITIVE (*) NONE DETECTED   Benzodiazepines NONE DETECTED  NONE DETECTED   Amphetamines NONE DETECTED  NONE DETECTED   Tetrahydrocannabinol POSITIVE (*) NONE DETECTED   Barbiturates NONE DETECTED  NONE DETECTED   Comment:            DRUG SCREEN FOR MEDICAL PURPOSES     ONLY.  IF CONFIRMATION IS NEEDED     FOR ANY PURPOSE, NOTIFY LAB     WITHIN 5 DAYS.                LOWEST DETECTABLE LIMITS     FOR URINE DRUG SCREEN     Drug Class  Cutoff (ng/mL)     Amphetamine      1000     Barbiturate      200     Benzodiazepine   161     Tricyclics       096     Opiates          300     Cocaine          300     THC              50   Psychological Evaluations:  Assessment:   DSM5:  Schizophrenia Disorders:  Schizophrenia (295.7) Obsessive-Compulsive Disorders:   Trauma-Stressor Disorders:   Substance/Addictive Disorders:  Cannabis Use Disorder - Moderate 9304.30) Depressive Disorders:    AXIS I:  Schizophrenia, Cocaine dependence, Cannabis dependence  AXIS II:  Deferred AXIS III:   Past Medical History  Diagnosis Date  . Schizophrenia    AXIS IV:  economic problems, housing  problems, other psychosocial or environmental problems and problems related to social environment AXIS V:  41-50 serious symptoms   Treatment Plan/Recommendations:   1. Admit for crisis management and stabilization. Estimated length of stay 5-7 days. 2. Medication management to reduce current symptoms to base line and improve the patient's level of functioning. Trazodone initiated to help improve sleep. 3. Develop treatment plan to decrease risk of relapse upon discharge of psychotic symptoms and the need for readmission. 5. Group therapy to facilitate development of healthy coping skills to use for depression and anxiety. 6. Health care follow up as needed for medical problems.  7. Discharge plan to include therapy to help patient cope with stressor of chronic mental illness.  8. Call for Consult with Hospitalist for additional specialty patient services as needed.   Treatment Plan Summary: Daily contact with patient to assess and evaluate symptoms and progress in treatment Medication management Current Medications:  Current Facility-Administered Medications  Medication Dose Route Frequency Provider Last Rate Last Dose  . acetaminophen (TYLENOL) tablet 650 mg  650 mg Oral Q6H PRN Shuvon Rankin, NP      . alum & mag hydroxide-simeth (MAALOX/MYLANTA) 200-200-20 MG/5ML suspension 30 mL  30 mL Oral Q4H PRN Shuvon Rankin, NP      . gabapentin (NEURONTIN) capsule 600 mg  600 mg Oral BID Shuvon Rankin, NP   600 mg at 03/23/13 0818  . lactobacillus acidophilus & bulgar (LACTINEX) chewable tablet 1 tablet  1 tablet Oral TID WC Laverle Hobby, PA-C   1 tablet at 03/23/13 1129  . lisinopril (PRINIVIL,ZESTRIL) tablet 10 mg  10 mg Oral Daily Shuvon Rankin, NP      . magnesium hydroxide (MILK OF MAGNESIA) suspension 30 mL  30 mL Oral Daily PRN Shuvon Rankin, NP      . OLANZapine (ZYPREXA) tablet 10 mg  10 mg Oral BID Shuvon Rankin, NP   10 mg at 03/22/13 2135  . traZODone (DESYREL) tablet 100 mg  100  mg Oral QHS Juline Sanderford        Observation Level/Precautions:  15 minute checks  Laboratory:  CBC Chemistry Profile UDS  Psychotherapy:  Individual and Group Therapy   Medications:  Zyprexa 10 mg BID for psychosis, Neurontin 600 mg BID for anxiety/improved mood stability   Consultations:  As needed   Discharge Concerns:  Safety and Stability, Continued medication noncompliance   Estimated LOS: 5-7 days  Other:     I certify that inpatient services furnished can reasonably be expected to improve the patient's  condition.   Elmarie Shiley NP-C 2/5/20151:54 PM   Patient seen, evaluated and I agree with notes by Nurse Practitioner. Corena Pilgrim, MD

## 2013-03-23 NOTE — [facility] Suicide Risk Assessment (Signed)
   Nursing information obtained from:  Patient Demographic factors:  Low socioeconomic status;Living alone Current Mental Status:  NA Loss Factors:  Financial problems / change in socioeconomic status Historical Factors:  Family history of mental illness or substance abuse Risk Reduction Factors:  Employed;Positive social support;Positive therapeutic relationship Total Time spent with patient: 20 minutes  CLINICAL FACTORS:   Severe Anxiety and/or Agitation Alcohol/Substance Abuse/Dependencies Schizophrenia:   Paranoid or undifferentiated type Currently Psychotic Unstable or Poor Therapeutic Relationship  Psychiatric Specialty Exam: Physical Exam  Psychiatric: His affect is angry and labile. His speech is rapid and/or pressured and tangential. He is aggressive. Thought content is paranoid and delusional. Cognition and memory are impaired. He expresses impulsivity.    Review of Systems  Constitutional: Negative.   HENT: Negative.   Eyes: Negative.   Respiratory: Negative.   Cardiovascular: Negative.   Gastrointestinal: Negative.   Genitourinary: Negative.   Musculoskeletal: Negative.   Skin: Negative.   Neurological: Negative.   Endo/Heme/Allergies: Negative.   Psychiatric/Behavioral: Positive for hallucinations and substance abuse. The patient is nervous/anxious and has insomnia.     Blood pressure 147/90, pulse 66, temperature 97.7 F (36.5 C), temperature source Oral, resp. rate 16, height 6\' 1"  (1.854 m), weight 86.637 kg (191 lb).Body mass index is 25.2 kg/(m^2).  General Appearance: Disheveled  Eye SolicitorContact::  Fair  Speech:  Garbled and Pressured  Volume:  Increased  Mood:  Irritable  Affect:  Full Range  Thought Process:  Circumstantial and Disorganized  Orientation:  Only to Place and Person)  Thought Content:  Delusions and Hallucinations: Auditory  Suicidal Thoughts:  No  Homicidal Thoughts:  No  Memory:  Immediate;   Fair Recent;   Poor Remote;   Poor   Judgement:  Poor  Insight:  Lacking  Psychomotor Activity:  Increased  Concentration:  Poor  Recall:  Poor  Fund of Knowledge:Poor  Language: Fair  Akathisia:  No  Handed:  Right  AIMS (if indicated):     Assets:  Desire for Improvement  Sleep:  Number of Hours: 4.75   Musculoskeletal: Strength & Muscle Tone: within normal limits Gait & Station: normal Patient leans: N/A  COGNITIVE FEATURES THAT CONTRIBUTE TO RISK:  Closed-mindedness Polarized thinking    SUICIDE RISK:   Minimal: No identifiable suicidal ideation.  Patients presenting with no risk factors but with morbid ruminations; may be classified as minimal risk based on the severity of the depressive symptoms  PLAN OF CARE:1. Admit for crisis management and stabilization. 2. Medication management to reduce current symptoms to base line and improve the  patient's overall level of functioning 3. Treat health problems as indicated. 4. Develop treatment plan to decrease risk of relapse upon discharge and the need for readmission. 5. Psycho-social education regarding relapse prevention and self care. 6. Health care follow up as needed for medical problems. 7. Restart home medications where appropriate.  I certify that inpatient services furnished can reasonably be expected to improve the patient's condition.  Hilda Rynders,MD 03/23/2013, 10:13 AM

## 2013-03-24 DIAGNOSIS — F259 Schizoaffective disorder, unspecified: Secondary | ICD-10-CM | POA: Diagnosis present

## 2013-03-24 MED ORDER — FLUPHENAZINE HCL 5 MG PO TABS
5.0000 mg | ORAL_TABLET | Freq: Two times a day (BID) | ORAL | Status: DC
  Administered 2013-03-25 – 2013-03-26 (×2): 5 mg via ORAL
  Filled 2013-03-24 (×6): qty 1

## 2013-03-24 MED ORDER — TRIHEXYPHENIDYL HCL 5 MG PO TABS
5.0000 mg | ORAL_TABLET | Freq: Two times a day (BID) | ORAL | Status: DC
  Administered 2013-03-25 – 2013-04-04 (×19): 5 mg via ORAL
  Filled 2013-03-24 (×5): qty 1
  Filled 2013-03-24 (×2): qty 6
  Filled 2013-03-24 (×16): qty 1
  Filled 2013-03-24 (×2): qty 6
  Filled 2013-03-24 (×6): qty 1

## 2013-03-24 MED ORDER — LAMOTRIGINE 25 MG PO TABS
25.0000 mg | ORAL_TABLET | Freq: Every day | ORAL | Status: DC
  Administered 2013-03-24 – 2013-03-26 (×3): 25 mg via ORAL
  Filled 2013-03-24 (×4): qty 1

## 2013-03-24 MED ORDER — OLANZAPINE 7.5 MG PO TABS
15.0000 mg | ORAL_TABLET | Freq: Two times a day (BID) | ORAL | Status: DC
  Administered 2013-03-24 – 2013-03-27 (×7): 15 mg via ORAL
  Administered 2013-03-28: 7.5 mg via ORAL
  Filled 2013-03-24 (×27): qty 2

## 2013-03-24 NOTE — Progress Notes (Signed)
Adult Psychoeducational Group Note  Date:  03/24/2013 Time:  10:00AM  Group Topic/Focus:  Relapse Prevention Planning:   The focus of this group is to define relapse and discuss the need for planning to combat relapse.  Participation Level:  Did Not Attend  Additional Comments:Pt did not attend the session.   Zacarias PontesSmith, Cortlyn Cannell R 03/24/2013, 11:03 AM

## 2013-03-24 NOTE — Progress Notes (Addendum)
Patient ID: Gregory Meza, male   DOB: 1965/06/06, 48 y.o.   MRN: [id number] 03-24-13 nursing shift note: D: this pt has been very specific about which medications he would take. He is religiously preoccupied and hyper verbal, with speech that is difficult to understand.   A: RN was able to establish trust with this pt and encouraged him to take his Lamictal and his Zyprexa this am. His Zyprexa was increased and this is reflected in his 1700 dose. Pt would not take his other medications. rn obtained a "no roommate order" due to this pt's extreme psychosis.  R: he denied any si/hi/av at this time. On his inventory sheet he wrote: slept well, appetite good, energy normal, attention good with his depression "good" and hopelessness at 3. The rest of his inventory sheet illegible. RN will monitor and Q 15 min ck's continue.

## 2013-03-24 NOTE — [facility] Group Notes (Signed)
[facility] LCSW Group Therapy  03/24/2013  1:05 PM  Type of Therapy:  Group therapy  Participation Level:  Stayed for 5 minutes while speaking in tongues and getting up and swaying in place.  Asked to be excused to go to the bathroom.  Was told if he left he could not come back.  Stomped out.    Summary of Progress/Problems:  Chaplain was here to lead a group on themes of hope and courage.  [name], Ludene Stokke B 03/24/2013 1:42 PM

## 2013-03-24 NOTE — Progress Notes (Signed)
[facility]  03/24/2013 11:06 AM Gregory FriesGilbert J Meza  MRN:  [id number] Subjective: " Let me make it clear to you, I am not going to take any of my medications today. You guys are trying to poison me with them medications.'' Objective:  Patient is very labile and agitated this morning. He is acting bizarre and his thoughts process is disorganized. He is observed talking to himself, he is paranoid, delusional and out of touch with reality. 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 He is defiant and oppositional. We contacted his ACT team who reports that he has not been taking his medications for over a month. His current medications are: Zyprexa 20mg  bid, Lamictal 25mg  bid and Prolixin 10mg  bid. Diagnosis:   DSM5: Schizophrenia Disorders:  Delusional Disorder (297.1) and Schizophrenia (295.7) Obsessive-Compulsive Disorders:   Trauma-Stressor Disorders:   Substance/Addictive Disorders:  Cannabis Use Disorder - Moderate 9304.30), Cocaine dependence Depressive Disorders:  Disruptive Mood Dysregulation Disorder (296.99) Total Time spent with patient: 20 minutes  Axis I: Schizoaffective Disorder            Cocaine dependence            Cannabis dependence Axis II: Cluster B Traits Axis III:  Past Medical History  Diagnosis Date  . Hypertension    Axis IV: other psychosocial or environmental problems and problems related to social environment Axis V: 21-30 behavior considerably influenced by delusions or hallucinations OR serious impairment in judgment, communication OR inability to function in almost all areas  ADL's:  Intact  Sleep: Fair  Appetite:  Fair  Suicidal Ideation:  Plan:  denies Intent:  denies Means:  denies Homicidal Ideation: denies  AEB (as evidenced by):  Psychiatric Specialty Exam: Physical Exam  Psychiatric: His affect is angry and labile. His speech is rapid and/or pressured and tangential.  He is agitated, aggressive and actively hallucinating. Thought content is paranoid and delusional. He expresses impulsivity. He exhibits abnormal recent memory and abnormal remote memory.    Review of Systems  Constitutional: Negative.   HENT: Negative.   Eyes: Negative.   Respiratory: Negative.   Cardiovascular: Negative.   Gastrointestinal: Negative.   Genitourinary: Negative.   Musculoskeletal: Negative.   Skin: Negative.   Neurological: Negative.   Endo/Heme/Allergies: Negative.   Psychiatric/Behavioral: Positive for hallucinations and substance abuse. The patient is nervous/anxious and has insomnia.     Blood pressure 135/85, pulse 65, temperature 97.7 F (36.5 C), temperature source Oral, resp. rate 16, height 6\' 1"  (1.854 m), weight 86.637 kg (191 lb).Body mass index is 25.2 kg/(m^2).  General Appearance: Fairly Groomed  Patent attorneyye Contact::  Good  Speech:  Garbled and Pressured  Volume:  Increased  Mood:  Angry and Irritable  Affect:  Labile and Full Range  Thought Process:  Disorganized  Orientation:  Only to place and person  Thought Content:  Delusions and Hallucinations: Auditory  Suicidal Thoughts:  No  Homicidal Thoughts:  No  Memory:  Immediate;   Poor Recent;   Poor Remote;   Poor  Judgement:  Poor  Insight:  Lacking  Psychomotor Activity:  Increased  Concentration:  Poor  Recall:  Poor  Fund of Knowledge:Poor  Language: Fair  Akathisia:  No  Handed:  Right  AIMS (if indicated):     Assets:  Financial Resources/Insurance Housing  Sleep:  Number of Hours: 5   Musculoskeletal: Strength & Muscle Tone: within normal  limits Gait & Station: normal Patient leans: N/A  Current Medications: Current Facility-Administered Medications  Medication Dose Route Frequency Provider Last Rate Last Dose  . acetaminophen (TYLENOL) tablet 650 mg  650 mg Oral Q6H PRN Shuvon Rankin, NP      . alum & mag hydroxide-simeth (MAALOX/MYLANTA) 200-200-20 MG/5ML suspension 30 mL  30  mL Oral Q4H PRN Shuvon Rankin, NP      . fluPHENAZine (PROLIXIN) tablet 5 mg  5 mg Oral BID PC Lear Carstens      . gabapentin (NEURONTIN) capsule 600 mg  600 mg Oral BID Shuvon Rankin, NP   600 mg at 03/23/13 1714  . lactobacillus acidophilus & bulgar (LACTINEX) chewable tablet 1 tablet  1 tablet Oral TID WC Kerry Hough, PA-C   1 tablet at 03/23/13 1715  . lamoTRIgine (LAMICTAL) tablet 25 mg  25 mg Oral Daily Hardie Veltre      . lisinopril (PRINIVIL,ZESTRIL) tablet 10 mg  10 mg Oral Daily Shuvon Rankin, NP      . magnesium hydroxide (MILK OF MAGNESIA) suspension 30 mL  30 mL Oral Daily PRN Shuvon Rankin, NP      . OLANZapine (ZYPREXA) tablet 15 mg  15 mg Oral BID Cyanna Neace      . traZODone (DESYREL) tablet 100 mg  100 mg Oral QHS Madoline Bhatt      . trihexyphenidyl (ARTANE) tablet 5 mg  5 mg Oral BID WC Oline Belk        Lab Results: No results found for this or any previous visit (from the past 48 hour(s)).  Physical Findings: AIMS: Facial and Oral Movements Muscles of Facial Expression: None, normal Lips and Perioral Area: None, normal Jaw: None, normal Tongue: None, normal,Extremity Movements Upper (arms, wrists, hands, fingers): None, normal Lower (legs, knees, ankles, toes): None, normal, Trunk Movements Neck, shoulders, hips: None, normal, Overall Severity Severity of abnormal movements (highest score from questions above): None, normal Incapacitation due to abnormal movements: None, normal Patient's awareness of abnormal movements (rate only patient's report): No Awareness, Dental Status Current problems with teeth and/or dentures?: No Does patient usually wear dentures?: No  CIWA:  CIWA-Ar Total: 1 COWS:  COWS Total Score: 1  Treatment Plan Summary: Daily contact with patient to assess and evaluate symptoms and progress in treatment Medication management  Plan:1. Admit for crisis management and stabilization. 2. Medication management to reduce  current symptoms to base line and improve the  patient's overall level of functioning 3. Treat health problems as indicated. 4. Develop treatment plan to decrease risk of relapse upon discharge and the need for readmission. 5. Psycho-social education regarding relapse prevention and self care. 6. Health care follow up as needed for medical problems. 7. Initiate Zyprexa 15mg  po BID, Lamictal 25mg  daily and Prolixin 5mg  po BID for delusions and psychosis.   Medical Decision Making Problem Points:  Established problem, worsening (2), Review of last therapy session (1) and Review of psycho-social stressors (1) Data Points:  Decision to obtain old records (1) Order Aims Assessment (2) Review and summation of old records (2) Review of medication regiment & side effects (2) Review of new medications or change in dosage (2)  I certify that inpatient services furnished can reasonably be expected to improve the patient's condition.   Thedore Mins, MD 03/24/2013, 11:06 AM

## 2013-03-24 NOTE — [facility] Group Notes (Signed)
[facility]   03/24/2013 8:16 AM  Participation Quality:  Minimal  Mood/Affect:  Excited  Depression Rating:    Anxiety Rating:    Thoughts of Suicide:  No Will you contract for safety?   NA  Current AVH:  Denies, but something is not right  Plan for Discharge/Comments:  Gregory Meza sat quietly this AM until asked a question.  He began speaking in tongues.  Transportation Means:   Supports:  Gregory Meza, Gregory Meza

## 2013-03-24 NOTE — Progress Notes (Signed)
Pt refused his morning vitals; he was asked twice once by MHT and again by his RN.

## 2013-03-24 NOTE — Progress Notes (Signed)
Patient ID: Gregory Meza, male   DOB: [birth year], 48 y.o.   MRN: [id number] D: Patient is much calmer and in his room sleeping. Pt refused his scheduled evening medicine stating he "does need it to go to sleep".  Pt denies suicidal /homicidal ideation, intent and plan. Pt denies auditory and visual hallucination but seem to be responding to internal stimuli. Pt denies SI/HI. Pt attended evening wrap up group but was unable to sit still for the entire session. Pt denies any needs or concerns. Cooperative with assessment. No acute distressed noted at this time.   A: Met with pt 1:1. Medications administered as prescribed. Writer encouraged pt to discuss feelings. Pt encouraged to come to staff with any questions or concerns.   R: Patient remains safe. He is complaint with medications and denies any adverse reaction. Continue current POC.

## 2013-03-24 NOTE — Progress Notes (Signed)
NUTRITION ASSESSMENT  Pt identified as at risk on the Malnutrition Screen Tool  INTERVENTION: None at this time.   NUTRITION DIAGNOSIS: Unintentional weight loss related to sub-optimal intake as evidenced by weight trend.   Goal: Pt to meet >/= 90% of their estimated nutrition needs.  Monitor:  PO intake  Assessment:  Pt presents involuntarily for psychosis and medication non compliance. Attempted to meet with pt however RN stated to not visit at this time due to pt's extreme psychosis and conversations leading to pt becoming angry and RN stated pt has been eating well at mealtimes.   48 y.o. male  Height: Ht Readings from Last 1 Encounters:  03/22/13 6\' 1"  (1.854 m)    Weight: Wt Readings from Last 1 Encounters:  03/22/13 191 lb (86.637 kg)    Weight Hx: Wt Readings from Last 10 Encounters:  03/22/13 191 lb (86.637 kg)  03/17/13 200 lb (90.719 kg)    BMI:  Body mass index is 25.2 kg/(m^2). Pt meets criteria for overweight based on current BMI.  Estimated Nutritional Needs: Kcal: 25-30 kcal/kg Protein: > 1 gram protein/kg Fluid: 1 ml/kcal  Diet Order: General Pt is also offered choice of unit snacks mid-morning and mid-afternoon.  Pt is eating as desired.   Lab results and medications reviewed.   Levon HedgerHeather Baron MS, RD, LDN [phone] Pager [phone] After Hours Pager

## 2013-03-24 NOTE — Progress Notes (Signed)
Patient ID: Gregory Meza, male   DOB: 1965-12-07, 48 y.o.   MRN: [id number] D. The patient was very irritable this evening. His speech was loud, rapid and pressured. The content was disorganized with loose, flight of idea, tangential thoughts. He is preoccupied with religous and grandiose delusions of grandeur. 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 A. Awakened patient to attend evening group. Attempt made to review and administrate HS medications. R. The patient arrived late to group. He was able to patiently wait his turn to speak. However, when it was his turn went on a tangent talking rapidly non stop that was difficult to understand what he was saying. Frequently rhymed words. Refused his HS medication stating that he already took too much medicine today and accused staff of trying to poison him.

## 2013-03-25 NOTE — Progress Notes (Addendum)
Patient ID: Gregory Meza, male   DOB: September 14, 1965, 48 y.o.   MRN: [id number] 03-25-13 nursing shift note: D: pt is still refusing some of his medications. He is taking the psychotropic medications with a great deal of encouragement. He is refusing his v/s. His thinking remains disorganized and he speech is pressured and garbled. Pt refused his 1700 psychotropic medications, prolixin, Lamictal and artane. RN moved them to 2000 hoping he would change his mind and take them at a later time. Will report to oncoming staff.    A: staff continues to build trust with this patient, encourage and praise him.redirection and limit setting has been implemented throughout the day. R: he denies any si/hi presently. On his inventory sheet he wrote: slept poor, appetite good, energy normal and low, attention improving with his depression at 5 and hopelessness at 6. W/d symptoms have been sedation. Physical problems in the last 24 hrs have been lightheadedness. Pain goal at 1. After discharge he plans to " stay off the street and where my original uniform". He want to tell staff that " you and I need forgiveness, so please forgive for whatever christ takes place cause I am not me that causes that shit". RN will monitor and Q 15 min ck's continue.

## 2013-03-25 NOTE — Progress Notes (Signed)
Patient ID: Gregory Meza, male   DOB: 1965-12-02, 48 y.o.   MRN: [id number] Psychoeducational Group Note  Date:  03/25/2013 [id number]:0945am  Group Topic/Focus:  Identifying Needs:   The focus of this group is to help patients identify their personal needs that have been historically problematic and identify healthy behaviors to address their needs.  Participation Level:  Active  Participation Quality:  Redirectable  Affect:  Excited  Cognitive:  Disorganized  Insight:  Distracting  Engagement in Group:  Monopolizing  Additional Comments:  Coping skills.   Gregory Meza, Gregory Meza 03/25/2013,10:25 AM

## 2013-03-25 NOTE — Progress Notes (Signed)
Patient ID: Robley FriesGilbert J Manton, male   DOB: 02-02-1966, 48 y.o.   MRN: [id number] Psychoeducational Group Note  Date:  03/25/2013 Time:0915am  Group Topic/Focus:  Identifying Needs:   The focus of this group is to help patients identify their personal needs that have been historically problematic and identify healthy behaviors to address their needs.  Participation Level:  Active  Participation Quality:  Redirectable  Affect:  Anxious  Cognitive:  Disorganized  Insight:  Engaged  Engagement in Group:  Engaged  Additional Comments:  Inventory group   Valente DavidWeaver, Kaye Luoma Brooks 03/25/2013,10:01 AM

## 2013-03-25 NOTE — [facility] Group Notes (Signed)
[facility] Group Notes:  (Clinical Social Work)  03/25/2013  11:00-11:45AM  Summary of Progress/Problems:   The main focus of today's process group was for the patient to identify ways in which they have in the past sabotaged their own recovery and reasons they may have done this/what they received from doing it.  We then worked to identify a specific plan to avoid doing this when discharged from the hospital for this admission.  The patient expressed continual chain of thoughts, mostly religiously-focused, and was speaking so fast he could not be understood.  He was able to be quieted when another patient would start speaking, but quickly started talking over them again.  Due to this pressured speech, flight of ideas, as well as the same in others, group was ended early.  Type of Therapy:  Group Therapy - Process  Participation Level:  Active  Participation Quality:  Intrusive, Inattentive and Monopolizing  Affect:  Excited, Not Congruent and Manic  Cognitive:  Disorganized  Insight:  None  Engagement in Therapy:  Off Topic  Modes of Intervention:  Clarification, Education, Exploration, Discussion  Ambrose MantleMareida Grossman-Orr, LCSW 03/25/2013, 12:27 PM

## 2013-03-25 NOTE — Progress Notes (Signed)
Adult Psychoeducational Group Note  Date:  03/25/2013 Time:  8:00 pm  Group Topic/Focus:  Wrap-Up Group:   The focus of this group is to help patients review their daily goal of treatment and discuss progress on daily workbooks.  Participation Level:  Active  Participation Quality:  Appropriate, Intrusive and Sharing  Affect:  Appropriate and Labile  Cognitive:  Appropriate  Insight: Limited  Engagement in Group:  Engaged  Modes of Intervention:  Discussion, Education, Socialization and Support  Additional Comments:  Pt stated that a finding higher is a coping skill for him to manage symptoms. Pt also stated that practicing patience is a coping skill. Pt stated that he had an up and down day and that he is concerned with taking so many medications.   Laural BenesJohnson, Melinna Linarez 03/25/2013, 9:40 PM

## 2013-03-25 NOTE — Progress Notes (Signed)
[facility]  03/25/2013 11:48 AM Robley FriesGilbert J Meza  MRN:  [id number] Subjective: " I don't have any psychiatric illness.  I don't need any medication. '' Objective:  Patient remains very labile disorganized and irritable.  The staff reported poor sleep last night.  He continued to refuse taking medication and requires excessive encouragement of medication compliance.  He seriously preoccupied and using hand gesture to answer the question.  He is delusional paranoid and difficult to redirect some time.  His current medications are Zyprexa, Lamictal and Prolixin but he requires excessive encouragement for medication compliance.  He's been very labile and disruptive into groups. Diagnosis:   DSM5: Schizophrenia Disorders:  Delusional Disorder (297.1) and Schizophrenia (295.7) Obsessive-Compulsive Disorders:   Trauma-Stressor Disorders:   Substance/Addictive Disorders:  Cannabis Use Disorder - Moderate 9304.30), Cocaine dependence Depressive Disorders:  Disruptive Mood Dysregulation Disorder (296.99) Total Time spent with patient: 20 minutes  Axis I: Schizoaffective Disorder            Cocaine dependence            Cannabis dependence Axis II: Cluster B Traits Axis III:  Past Medical History  Diagnosis Date  . Hypertension    Axis IV: other psychosocial or environmental problems and problems related to social environment Axis V: 21-30 behavior considerably influenced by delusions or hallucinations OR serious impairment in judgment, communication OR inability to function in almost all areas  ADL's:  Intact  Sleep: Fair  Appetite:  Fair  Suicidal Ideation:  Plan:  denies Intent:  denies Means:  denies Homicidal Ideation: denies  AEB (as evidenced by):  Psychiatric Specialty Exam: Physical Exam  Psychiatric: His affect is angry and labile. His speech is rapid and/or pressured and tangential. He is agitated, aggressive and actively hallucinating. Thought content is paranoid  and delusional. He expresses impulsivity. He exhibits abnormal recent memory and abnormal remote memory.    Review of Systems  Constitutional: Negative.   HENT: Negative.   Eyes: Negative.   Respiratory: Negative.   Cardiovascular: Negative.   Gastrointestinal: Negative.   Genitourinary: Negative.   Musculoskeletal: Negative.   Skin: Negative.   Neurological: Negative.   Endo/Heme/Allergies: Negative.   Psychiatric/Behavioral: Positive for hallucinations and substance abuse. The patient is nervous/anxious and has insomnia.     Blood pressure 135/85, pulse 65, temperature 97.7 F (36.5 C), temperature source Oral, resp. rate 16, height 6\' 1"  (1.854 m), weight 191 lb (86.637 kg).Body mass index is 25.2 kg/(m^2).  General Appearance: Fairly Groomed  Patent attorneyye Contact::  Good  Speech:  Garbled and Pressured  Volume:  Increased  Mood:  Angry and Irritable  Affect:  Labile and Full Range  Thought Process:  Disorganized  Orientation:  Only to place and person  Thought Content:  Delusions and Hallucinations: Auditory  Suicidal Thoughts:  No  Homicidal Thoughts:  No  Memory:  Immediate;   Poor Recent;   Poor Remote;   Poor  Judgement:  Poor  Insight:  Lacking  Psychomotor Activity:  Increased  Concentration:  Poor  Recall:  Poor  Fund of Knowledge:Poor  Language: Fair  Akathisia:  No  Handed:  Right  AIMS (if indicated):     Assets:  Financial Resources/Insurance Housing  Sleep:  Number of Hours: 5.25   Musculoskeletal: Strength & Muscle Tone: within normal limits Gait & Station: normal Patient leans: N/A  Current Medications: Current Facility-Administered Medications  Medication Dose Route Frequency Provider Last Rate Last Dose  . acetaminophen (TYLENOL) tablet 650 mg  650 mg Oral Q6H PRN Shuvon Rankin, NP      . alum & mag hydroxide-simeth (MAALOX/MYLANTA) 200-200-20 MG/5ML suspension 30 mL  30 mL Oral Q4H PRN Shuvon Rankin, NP      . fluPHENAZine (PROLIXIN) tablet 5 mg  5  mg Oral BID PC Mojeed Akintayo   5 mg at 03/25/13 0804  . gabapentin (NEURONTIN) capsule 600 mg  600 mg Oral BID Shuvon Rankin, NP   600 mg at 03/23/13 1714  . lactobacillus acidophilus & bulgar (LACTINEX) chewable tablet 1 tablet  1 tablet Oral TID WC Kerry Hough, PA-C   1 tablet at 03/23/13 1715  . lamoTRIgine (LAMICTAL) tablet 25 mg  25 mg Oral Daily Mojeed Akintayo   25 mg at 03/25/13 0804  . lisinopril (PRINIVIL,ZESTRIL) tablet 10 mg  10 mg Oral Daily Shuvon Rankin, NP      . magnesium hydroxide (MILK OF MAGNESIA) suspension 30 mL  30 mL Oral Daily PRN Shuvon Rankin, NP      . OLANZapine (ZYPREXA) tablet 15 mg  15 mg Oral BID Mojeed Akintayo   15 mg at 03/25/13 0624  . traZODone (DESYREL) tablet 100 mg  100 mg Oral QHS Mojeed Akintayo      . trihexyphenidyl (ARTANE) tablet 5 mg  5 mg Oral BID WC Mojeed Akintayo   5 mg at 03/25/13 1610    Lab Results: No results found for this or any previous visit (from the past 48 hour(s)).  Physical Findings: AIMS: Facial and Oral Movements Muscles of Facial Expression: None, normal Lips and Perioral Area: None, normal Jaw: None, normal Tongue: None, normal,Extremity Movements Upper (arms, wrists, hands, fingers): None, normal Lower (legs, knees, ankles, toes): None, normal, Trunk Movements Neck, shoulders, hips: None, normal, Overall Severity Severity of abnormal movements (highest score from questions above): None, normal Incapacitation due to abnormal movements: None, normal Patient's awareness of abnormal movements (rate only patient's report): No Awareness, Dental Status Current problems with teeth and/or dentures?: No Does patient usually wear dentures?: No  CIWA:  CIWA-Ar Total: 1 COWS:  COWS Total Score: 1  Treatment Plan Summary: Daily contact with patient to assess and evaluate symptoms and progress in treatment Medication management  Plan:1. Admit for crisis management and stabilization. 2. Medication management to reduce  current symptoms to base line and improve the  patient's overall level of functioning 3. Treat health problems as indicated. 4. Develop treatment plan to decrease risk of relapse upon discharge and the need for readmission. 5. Psycho-social education regarding relapse prevention and self care. 6. Health care follow up as needed for medical problems. 7. continue Zyprexa 15mg  po BID, Lamictal 25mg  daily and Prolixin 5mg  po BID for delusions and psychosis.   Medical Decision Making Problem Points:  Established problem, worsening (2), Review of last therapy session (1) and Review of psycho-social stressors (1) Data Points:  Review of medication regiment & side effects (2)  I certify that inpatient services furnished can reasonably be expected to improve the patient's condition.   Omarian Jaquith T., MD 03/25/2013, 11:48 AM

## 2013-03-26 MED ORDER — QUETIAPINE FUMARATE 50 MG PO TABS
50.0000 mg | ORAL_TABLET | Freq: Three times a day (TID) | ORAL | Status: DC
  Administered 2013-03-26: 50 mg via ORAL
  Filled 2013-03-26 (×4): qty 1

## 2013-03-26 MED ORDER — LAMOTRIGINE 25 MG PO TABS
25.0000 mg | ORAL_TABLET | Freq: Two times a day (BID) | ORAL | Status: DC
  Administered 2013-03-27: 25 mg via ORAL
  Filled 2013-03-26 (×8): qty 1

## 2013-03-26 MED ORDER — FLUPHENAZINE HCL 10 MG PO TABS
10.0000 mg | ORAL_TABLET | Freq: Two times a day (BID) | ORAL | Status: DC
  Filled 2013-03-26 (×20): qty 1

## 2013-03-26 NOTE — Progress Notes (Signed)
Patient ID: Gregory Meza, male   DOB: 05/31/1965, 48 y.o.   MRN: [id number] D. The patient remains very labile, disorganized and somewhat irritable at times. He is preoccupied and using hand gestures as well as speaking in a made up language. He remains delusional with  paranoid thoughts with hyper-religious and sexual content.  A. Encouraged patient to participate in evening group. Attempted to administer HS medication. R. The patient was very labile and disruptive in group and difficult to redirect. Refused HS medication and became agitated stating the staff is trying to keep him sedated and his body doesn't need rest.

## 2013-03-26 NOTE — [facility] Group Notes (Signed)
[facility] Group Notes:  (Clinical Social Work)  03/26/2013   11:15am-12:00pm  Summary of Progress/Problems:  The main focus of today's process group was to listen to a variety of genres of music and to identify that different types of music provoke different responses.  The patient then was able to identify personally what was soothing for them, as well as energizing.  Handouts were used to record feelings evoked, as well as how patient can personally use this knowledge in sleep habits, with depression, and with other symptoms.  The patient listened to each piece of music, and at times responded with how it made him feel; however, CSW could not understand his pressured and garbled speech.  He presented with underwear on his head and some kind of garment wrapped around his waist as though a belt.  He made odd hand gestures for much of group.  Type of Therapy:  Music Therapy   Participation Level:  Active  Participation Quality:  Attentive   Affect:  Blunted  Cognitive:  Disorganized and Hallucinating  Insight:  Limited  Engagement in Therapy:  Limited  Modes of Intervention:   Activity, Exploration  Ambrose MantleMareida Grossman-Orr, LCSW 03/26/2013, 12:30pm

## 2013-03-26 NOTE — Progress Notes (Signed)
Patient ID: Gregory Meza, male   DOB: [birth year], 48 y.o.   MRN: [id number] Psychoeducational Group Note  Date:  03/26/2013 Time:  0930am  Group Topic/Focus:  Making Healthy Choices:   The focus of this group is to help patients identify negative/unhealthy choices they were using prior to admission and identify positive/healthier coping strategies to replace them upon discharge.  Participation Level:  Active  Participation Quality:  Monopolizing and Redirectable  Affect:  Excited and Labile  Cognitive:  Disorganized and Confused  Insight:  Engineer, miningDistracting and Monopolizing  Engagement in Group:  Engineer, miningDistracting and Monopolizing  Additional Comments:  Inventory and healthy support systems.  Valente DavidWeaver, Joliyah Lippens Brooks 03/26/2013,10:43 AM

## 2013-03-26 NOTE — Progress Notes (Addendum)
[facility] MD Progress Note  03/26/2013 11:37 AM DONNIVAN VILLENA  MRN:  [id number] Subjective: " I don't need any medication. '' Objective:  Patient remains agitated and irritable.  He is refusing his medication.  He continues to have insomnia and disorganized thinking.  He requires excessive encouragement of medication compliance.  He remains preoccupied and using hand gesture to answer the question.  He is delusional paranoid and difficult to redirect some time.  His current medications are Zyprexa, Lamictal and Prolixin but he requires excessive encouragement for medication compliance.  He's been very labile and disruptive into groups. Diagnosis:   DSM5: Schizophrenia Disorders:  Delusional Disorder (297.1) and Schizophrenia (295.7) Obsessive-Compulsive Disorders:   Trauma-Stressor Disorders:   Substance/Addictive Disorders:  Cannabis Use Disorder - Moderate 9304.30), Cocaine dependence Depressive Disorders:  Disruptive Mood Dysregulation Disorder (296.99) Total Time spent with patient: 20 minutes  Axis I: Schizoaffective Disorder            Cocaine dependence            Cannabis dependence Axis II: Cluster B Traits Axis III:  Past Medical History  Diagnosis Date  . Hypertension    Axis IV: other psychosocial or environmental problems and problems related to social environment Axis V: 21-30 behavior considerably influenced by delusions or hallucinations OR serious impairment in judgment, communication OR inability to function in almost all areas  ADL's:  Intact  Sleep: Fair  Appetite:  Fair  Suicidal Ideation:  Plan:  denies Intent:  denies Means:  denies Homicidal Ideation: denies  AEB (as evidenced by):  Psychiatric Specialty Exam: Physical Exam  Psychiatric: His affect is angry and labile. His speech is rapid and/or pressured and tangential. He is agitated, aggressive and actively hallucinating. Thought content is paranoid and delusional. He expresses impulsivity. He exhibits  abnormal recent memory and abnormal remote memory.    Review of Systems  Constitutional: Negative.   HENT: Negative.   Eyes: Negative.   Respiratory: Negative.   Cardiovascular: Negative.   Gastrointestinal: Negative.   Genitourinary: Negative.   Musculoskeletal: Negative.   Skin: Negative.   Neurological: Negative.   Endo/Heme/Allergies: Negative.   Psychiatric/Behavioral: Positive for hallucinations and substance abuse. The patient is nervous/anxious and has insomnia.     Blood pressure 135/85, pulse 65, temperature 97.7 F (36.5 C), temperature source Oral, resp. rate 16, height 6\' 1"  (1.854 m), weight 191 lb (86.637 kg).Body mass index is 25.2 kg/(m^2).  General Appearance: Fairly Groomed  Patent attorney::  Good  Speech:  Garbled and Pressured  Volume:  Increased  Mood:  Angry and Irritable  Affect:  Labile and Full Range  Thought Process:  Disorganized  Orientation:  Only to place and person  Thought Content:  Delusions and Hallucinations: Auditory  Suicidal Thoughts:  No  Homicidal Thoughts:  No  Memory:  Immediate;   Poor Recent;   Poor Remote;   Poor  Judgement:  Poor  Insight:  Lacking  Psychomotor Activity:  Increased  Concentration:  Poor  Recall:  Poor  Fund of Knowledge:Poor  Language: Fair  Akathisia:  No  Handed:  Right  AIMS (if indicated):     Assets:  Financial Resources/Insurance Housing  Sleep:  Number of Hours: 4.25   Musculoskeletal: Strength & Muscle Tone: within normal limits Gait & Station: normal Patient leans: N/A  Current Medications: Current Facility-Administered Medications  Medication Dose Route Frequency Provider Last Rate Last Dose  . acetaminophen (TYLENOL) tablet 650 mg  650 mg Oral Q6H PRN Shuvon Rankin,  NP      . alum & mag hydroxide-simeth (MAALOX/MYLANTA) 200-200-20 MG/5ML suspension 30 mL  30 mL Oral Q4H PRN Shuvon Rankin, NP      . fluPHENAZine (PROLIXIN) tablet 5 mg  5 mg Oral BID PC Mojeed Akintayo   5 mg at 03/26/13 0640   . gabapentin (NEURONTIN) capsule 600 mg  600 mg Oral BID Shuvon Rankin, NP   600 mg at 03/26/13 0639  . lactobacillus acidophilus & bulgar (LACTINEX) chewable tablet 1 tablet  1 tablet Oral TID WC Kerry HoughSpencer E Simon, PA-C   1 tablet at 03/23/13 1715  . lamoTRIgine (LAMICTAL) tablet 25 mg  25 mg Oral Daily Mojeed Akintayo   25 mg at 03/26/13 [id number]  . lisinopril (PRINIVIL,ZESTRIL) tablet 10 mg  10 mg Oral Daily Shuvon Rankin, NP      . magnesium hydroxide (MILK OF MAGNESIA) suspension 30 mL  30 mL Oral Daily PRN Shuvon Rankin, NP      . OLANZapine (ZYPREXA) tablet 15 mg  15 mg Oral BID Mojeed Akintayo   15 mg at 03/26/13 0637  . QUEtiapine (SEROQUEL) tablet 50 mg  50 mg Oral TID Nanine MeansJamison Lord, NP   50 mg at 03/26/13 0931  . traZODone (DESYREL) tablet 100 mg  100 mg Oral QHS Mojeed Akintayo      . trihexyphenidyl (ARTANE) tablet 5 mg  5 mg Oral BID WC Mojeed Akintayo   5 mg at 03/26/13 [id number]    Lab Results: No results found for this or any previous visit (from the past 48 hour(s)).  Physical Findings: AIMS: Facial and Oral Movements Muscles of Facial Expression: None, normal Lips and Perioral Area: None, normal Jaw: None, normal Tongue: None, normal,Extremity Movements Upper (arms, wrists, hands, fingers): None, normal Lower (legs, knees, ankles, toes): None, normal, Trunk Movements Neck, shoulders, hips: None, normal, Overall Severity Severity of abnormal movements (highest score from questions above): None, normal Incapacitation due to abnormal movements: None, normal Patient's awareness of abnormal movements (rate only patient's report): No Awareness, Dental Status Current problems with teeth and/or dentures?: No Does patient usually wear dentures?: No  CIWA:  CIWA-Ar Total: 1 COWS:  COWS Total Score: 1  Treatment Plan Summary: Daily contact with patient to assess and evaluate symptoms and progress in treatment Medication management  Plan:1. Admit for crisis management and  stabilization. 2. Medication management to reduce current symptoms to base line and improve the  patient's overall level of functioning 3. Treat health problems as indicated. 4. Develop treatment plan to decrease risk of relapse upon discharge and the need for readmission. 5. Psycho-social education regarding relapse prevention and self care. 6. Health care follow up as needed for medical problems. 7. continue Zyprexa 15mg  po BID,  Increase Lamictal 25 mg twice a day and Prolixin 10 mg po BID for delusions and psychosis.   Medical Decision Making Problem Points:  Established problem, worsening (2), Review of last therapy session (1) and Review of psycho-social stressors (1) Data Points:  Review of medication regiment & side effects (2)  I certify that inpatient services furnished can reasonably be expected to improve the patient's condition.   Kallista Pae T., MD 03/26/2013, 11:37 AM

## 2013-03-26 NOTE — Progress Notes (Signed)
Adult Psychoeducational Group Note  Date:  03/26/2013 Time:  10:06 PM  Group Topic/Focus:  Wrap-Up Group:   The focus of this group is to help patients review their daily goal of treatment and discuss progress on daily workbooks.  Participation Level:  Minimal  Participation Quality:  Resistant  Affect:  Irritable  Cognitive:  Lacking  Insight: Lacking  Engagement in Group:  Lacking  Modes of Intervention:  Support  Additional Comments:  Patient attended and participated in group tonight. He reports that he clean himself and took his medication. He has been very labile and disruptive.  Lita MainsFrancis, Carmesha Morocco [facility] 03/26/2013, 10:06 PM

## 2013-03-26 NOTE — Progress Notes (Signed)
Patient ID: Gregory FriesGilbert J Kniss, male   DOB: [birth year], 48 y.o.   MRN: [id number] D)  Attended group this evening, had been quiet and was sitting with eyes closed, but became agitated when a male peer was loud and intrusive, pointed hat him and told him he needed to was more thoroughly and that he smelled.  He became loud and defensive but she was louder and each had to be redirected.  He stayed until group was finished and went to his room and went to bed, covering his head..  When his meds were taken to him, he was still angry and refused hs meds, said he would take them in the am and to leave him alone.   A)  Will continue to monitor for safety, continue POC, encourage meds and good hygiene R)  Safety maintained at this time, resting.

## 2013-03-26 NOTE — Progress Notes (Signed)
Patient ID: Gregory FriesGilbert J Hinchcliff, male   DOB: April 16, 1965, 48 y.o.   MRN: [id number] 03/26/2013 1020 -  Data:  Patient was able to fill out inventory sheet on his own.   Patient states he slept  well last night.  Appetite is good.  Patient has been showing aggressive behavior with staff getting in face at times  and taking medication with extreme resistance.   Patient has been observed pacing the halls and talking with himself.   Patient is currently denying suicidal or homicidal thoughts at this time.  Action:  Patient given medication as prescribed.  Patient encouraged to attend group therapy.  Response:  Patient taking medication and did attend group session today.RN will monitor and Q 15 min continue.

## 2013-03-27 NOTE — Progress Notes (Signed)
Patient ID: Gregory Meza, male   DOB: 1965-03-12, 48 y.o.   MRN: [id number] [facility] MD Progress Note  03/27/2013 2:51 PM DERL ABALOS  MRN:  [id number] Subjective: Patient states "I was reading about you in the AA book here. I don't like being prescribed medications that I don't need. Jesus Christ is the answer."   Objective:  Patient visible in the milieu today. He continues to appear to be responding to internal stimuli, has disorganized thought processes, is hyper-verbal, delusional and easily agitated. The patient is difficult to understand due to garbled and rambling speech. Patient making bizarre hand gestures throughout assessment with Clinical research associate. He became angry when asked why he is refusing some of his medications stating "I don't take medications that I don't need.", then asked this writer to leave his room at once. This morning the patient took Zyprexa but refused his Prolixin and Lisinopril. Patient has no insight into his psychiatric illness. The patient has been getting into conflicts with other peers on the unit and has needed redirection on several occasions.   Diagnosis:   DSM5: Schizophrenia Disorders:  Delusional Disorder (297.1) and Schizophrenia (295.7) Obsessive-Compulsive Disorders:   Trauma-Stressor Disorders:   Substance/Addictive Disorders:  Cannabis Use Disorder - Moderate 9304.30), Cocaine dependence Depressive Disorders:  Disruptive Mood Dysregulation Disorder (296.99) Total Time spent with patient: 20 minutes  Axis I: Schizoaffective Disorder            Cocaine dependence            Cannabis dependence Axis II: Cluster B Traits Axis III:  Past Medical History  Diagnosis Date  . Hypertension    Axis IV: other psychosocial or environmental problems and problems related to social environment Axis V: 21-30 behavior considerably influenced by delusions or hallucinations OR serious impairment in judgment, communication OR inability to function in almost all  areas  ADL's:  Intact  Sleep: Fair  Appetite:  Fair  Suicidal Ideation:  Plan:  denies Intent:  denies Means:  denies Homicidal Ideation: denies  AEB (as evidenced by):  Psychiatric Specialty Exam: Physical Exam  Psychiatric: His affect is angry and labile. His speech is rapid and/or pressured and tangential. He is agitated, aggressive and actively hallucinating. Thought content is paranoid and delusional. He expresses impulsivity. He exhibits abnormal recent memory and abnormal remote memory.    Review of Systems  Constitutional: Negative.   HENT: Negative.   Eyes: Negative.   Respiratory: Negative.   Cardiovascular: Negative.   Gastrointestinal: Negative.   Genitourinary: Negative.   Musculoskeletal: Negative.   Skin: Negative.   Neurological: Negative.   Endo/Heme/Allergies: Negative.   Psychiatric/Behavioral: Positive for hallucinations and substance abuse (UDS on admission positive for cocaine/THC). Negative for depression, suicidal ideas and memory loss. The patient is nervous/anxious and has insomnia.     Blood pressure 135/85, pulse 65, temperature 97.7 F (36.5 C), temperature source Oral, resp. rate 16, height 6\' 1"  (1.854 m), weight 86.637 kg (191 lb).Body mass index is 25.2 kg/(m^2).  General Appearance: Fairly Groomed  Patent attorney::  Good  Speech:  Garbled and Pressured  Volume:  Increased  Mood:  Angry and Irritable  Affect:  Labile and Full Range  Thought Process:  Disorganized  Orientation:  Only to place and person  Thought Content:  Delusions and Hallucinations: Auditory  Suicidal Thoughts:  No  Homicidal Thoughts:  No  Memory:  Immediate;   Poor Recent;   Poor Remote;   Poor  Judgement:  Poor  Insight:  Lacking  Psychomotor Activity:  Increased  Concentration:  Poor  Recall:  Poor  Fund of Knowledge:Poor  Language: Fair  Akathisia:  No  Handed:  Right  AIMS (if indicated):     Assets:  Financial Resources/Insurance Housing  Sleep:   Number of Hours: 4.5   Musculoskeletal: Strength & Muscle Tone: within normal limits Gait & Station: normal Patient leans: N/A  Current Medications: Current Facility-Administered Medications  Medication Dose Route Frequency Provider Last Rate Last Dose  . acetaminophen (TYLENOL) tablet 650 mg  650 mg Oral Q6H PRN Shuvon Rankin, NP      . alum & mag hydroxide-simeth (MAALOX/MYLANTA) 200-200-20 MG/5ML suspension 30 mL  30 mL Oral Q4H PRN Shuvon Rankin, NP      . fluPHENAZine (PROLIXIN) tablet 10 mg  10 mg Oral BID PC Cleotis Nipper, MD      . gabapentin (NEURONTIN) capsule 600 mg  600 mg Oral BID Shuvon Rankin, NP   600 mg at 03/27/13 0824  . lactobacillus acidophilus & bulgar (LACTINEX) chewable tablet 1 tablet  1 tablet Oral TID WC Kerry Hough, PA-C   1 tablet at 03/23/13 1715  . lamoTRIgine (LAMICTAL) tablet 25 mg  25 mg Oral BID Cleotis Nipper, MD   25 mg at 03/27/13 0825  . lisinopril (PRINIVIL,ZESTRIL) tablet 10 mg  10 mg Oral Daily Shuvon Rankin, NP      . magnesium hydroxide (MILK OF MAGNESIA) suspension 30 mL  30 mL Oral Daily PRN Shuvon Rankin, NP      . OLANZapine (ZYPREXA) tablet 15 mg  15 mg Oral BID Najee Cowens   15 mg at 03/27/13 0825  . traZODone (DESYREL) tablet 100 mg  100 mg Oral QHS Deryl Ports      . trihexyphenidyl (ARTANE) tablet 5 mg  5 mg Oral BID WC Maleik Vanderzee   5 mg at 03/27/13 0825    Lab Results: No results found for this or any previous visit (from the past 48 hour(s)).  Physical Findings: AIMS: Facial and Oral Movements Muscles of Facial Expression: None, normal Lips and Perioral Area: None, normal Jaw: None, normal Tongue: None, normal,Extremity Movements Upper (arms, wrists, hands, fingers): None, normal Lower (legs, knees, ankles, toes): None, normal, Trunk Movements Neck, shoulders, hips: None, normal, Overall Severity Severity of abnormal movements (highest score from questions above): None, normal Incapacitation due to abnormal  movements: None, normal Patient's awareness of abnormal movements (rate only patient's report): No Awareness, Dental Status Current problems with teeth and/or dentures?: No Does patient usually wear dentures?: No  CIWA:  CIWA-Ar Total: 1 COWS:  COWS Total Score: 1  Treatment Plan Summary: Daily contact with patient to assess and evaluate symptoms and progress in treatment Medication management  Plan:1. Continue crisis management and stabilization. 2. Medication management to reduce current symptoms to base line and improve the patient's overall level of functioning 3. Treat health problems as indicated. Encourage patient to take Lisinopril for elevated blood pressure but has been refusing.  4. Develop treatment plan to decrease risk of relapse upon discharge and the need for readmission. 5. Psycho-social education regarding relapse prevention and self care. 6. Health care follow up as needed for medical problems. 7. Continue Zyprexa 15mg  po BID for psychosis,  Lamictal 25 mg twice a day for improved mood stability and Prolixin 10 mg po BID for delusions and psychosis. 8. Will consider obtaining second opinion tomorrow for forced medications if the patient continues to be noncompliant with his treatment.   Medical  Decision Making Problem Points:  Established problem, worsening (2), Review of last therapy session (1) and Review of psycho-social stressors (1) Data Points:  Order Aims Assessment (2) Review of medication regiment & side effects (2)  I certify that inpatient services furnished can reasonably be expected to improve the patient's condition.   Fransisca KaufmannDAVIS, LAURA, NP-C 03/27/2013, 2:51 PM  Patient seen, evaluated and I agree with notes by Nurse Practitioner. Thedore MinsMojeed Fadia Marlar, MD

## 2013-03-27 NOTE — Progress Notes (Signed)
Patient ID: Gregory Meza, male   DOB: 08/25/1965, 48 y.o.   MRN: [id number] D: Patient remains with disorganized thought processes.  At times, he is incoherent and mumbling with his speech.  He is hyperverbal and restless.  Patient appears to be responding to internal stimuli.  He denies any SI/HI.  Patient has labile mood at times.  He attends groups and can be disruptive.  He is difficult to redirect at times.  Patient is selective as to what medications he will take.  He refused his blood pressure medication and prolixin this am.  A: continue to monitor patient's behavior and redirect as necessary.  Encourage and support patient.  Explain meds and indications of same.  R: patient needs redirection at times.

## 2013-03-27 NOTE — [facility] Group Notes (Signed)
[facility]   03/27/2013 10:42 AM  Participation Quality:  Engaged  Mood/Affect:  Excited  Depression Rating:  denies  Anxiety Rating:  denies  Thoughts of Suicide:  No Will you contract for safety?   NA  Current AVH:  Delusions  Plan for Discharge/Comments:  [name] told me a lot of people confuse him with another patient.  "We grew up together and were in the same gang."  I asked him about schools.  "[place] to [employer] [place] [employer].  I don't know where he went."  Transportation Means:  ACT team  Supports: ACT team  Ida RogueNorth, Lindon Kiel B

## 2013-03-27 NOTE — [facility] Group Notes (Signed)
[facility] LCSW Group Therapy  03/27/2013 1:15 pm  Type of Therapy: Process Group Therapy  Participation Level:  Did not attend    Summary of Progress/Problems: Today's group addressed the issue of overcoming obstacles.  Patients were asked to identify their biggest obstacle post d/c that stands in the way of their on-going success, and then problem solve as to how to manage this.  Gregory Meza, Gregory Meza 03/27/2013   2:54 PM

## 2013-03-28 MED ORDER — LISINOPRIL 20 MG PO TABS
20.0000 mg | ORAL_TABLET | Freq: Every day | ORAL | Status: DC
  Filled 2013-03-28 (×10): qty 1

## 2013-03-28 NOTE — Progress Notes (Signed)
D   Pt is guarded and paranoid  He is also irritable  He cut in line at the medication window to tell staff that he was not taking any more medicine today   His speech is slurred and he is hard to understand sometimes  He is not lethargic  A   Verbal support given   Medications administered and effectiveness monitored   Q 15 min checks R   Pt safe at present

## 2013-03-28 NOTE — [facility] Group Notes (Signed)
[facility] LCSW Group Therapy  03/28/2013 , 2:08 PM   Type of Therapy:  Group Therapy  Participation Level:  Active  Participation Quality:  Attentive  Affect:  Exiteable  Cognitive:  Oriented  Insight:  Limited  Engagement in Therapy:  Engaged  Modes of Intervention:  Discussion, Exploration and Socialization  Summary of Progress/Problems: Today's group focused on the term Diagnosis.  Participants were asked to define the term, and then pronounce whether it is a negative, positive or neutral term.  [name] stayed for the entire group.  At one point he got up, and I thought he was leaving, but he came to where I was standing and wanted to shake my hand about a point I had made.  I guided him back to his chair.  He made several salient comments, insightful even, followed by non-sensical thoughts, rhyming words and gibberish.  Mood was good throughout.  [name], Keymiah Lyles B 03/28/2013 , 2:08 PM

## 2013-03-28 NOTE — Progress Notes (Signed)
D   Pt seems less agitated but continues to refuse his bedtime medications   He paces the hall and has limited interaction with others   He is active in groups  And otherwise compliant with treatment A   Verbal support given   Medications administered and effectiveness monitored   Q 15 min checks R   Pt safe at present

## 2013-03-28 NOTE — Progress Notes (Signed)
Adult Psychoeducational Group Note  Date:  03/28/2013 Time:  8:00 pm  Group Topic/Focus:  Wrap-Up Group:   The focus of this group is to help patients review their daily goal of treatment and discuss progress on daily workbooks.  Participation Level:  Active  Participation Quality:  Appropriate and Sharing  Affect:  Appropriate  Cognitive:  Appropriate  Insight: Lacking  Engagement in Group:  Distracting and Engaged  Modes of Intervention:  Discussion, Education, Socialization and Support  Additional Comments:  Pt was difficult to understand during meeting. Pt talked in circles and did not make much sense. Pt interrupted other patients several times and was redirected by the staff. Pt stated that his day was okay.   Laural BenesJohnson, Chinita Schimpf 03/28/2013, 8:47 PM

## 2013-03-28 NOTE — [facility] Group Notes (Signed)
[facility] Group Notes:  (Nursing/MHT/Case Management/Adjunct)  Date:  03/28/2013  Time:  0900 am  Type of Therapy:  Nurse Education  Participation Level:  Active  Participation Quality:  Appropriate  Affect:  Appropriate  Cognitive:  Alert and Disorganized  Insight:  Appropriate  Engagement in Group:  Distracting and Engaged  Modes of Intervention:  Education  Summary of Progress/Problems: Pt cooperative during group. Albertina Leise L 03/28/2013, 9:33 AM

## 2013-03-28 NOTE — Progress Notes (Signed)
Patient ID: Gregory Meza, male   DOB: 05-Sep-1965, 48 y.o.   MRN: [id number] D: patient presents with disorganized thought processes.  He denies SI/HI, however appears to be responding to internal stimuli.  Patient is difficult to understand due to nonsensical rambling.  He does have moments where he can be understood.  He remains noncompliant with his medications, only taking Artane.  A: continue to monitor behavior and redirect as necessary.  15 minute checks continued for safety.  R: patient is redirectable and is receptive to staff.

## 2013-03-28 NOTE — Progress Notes (Signed)
Patient ID: Gregory Meza, male   DOB: 28-Jan-1966, 48 y.o.   MRN: [id number] [facility] MD Progress Note  03/28/2013 1:55 PM Gregory Meza  MRN:  [id number] Subjective: Patient states "I only take medication as needed. You can go ask my brother who is a Warden/ranger. Now you are free to go."   Objective:  Patient visible in the milieu today. He continues to appear to be responding to internal stimuli, has disorganized thought processes, is hyper-verbal, delusional and easily agitated. The patient is difficult to understand due to garbled and rambling speech. Upon reviewing his MAR the patient appears to be taking a portion of his prescribed medications including his Zyprexa, Artane, and Neurontin. Patient becomes easily agitated when asking about his medication compliance but has not been physically aggressive on the unit. He continues to make bizarre, religious type blessing motions with his hands. Nursing notes indicated the patient acts paranoid on the unit and last night cut in front of peers in the medication line to report he was not taking any more medication. This Clinical research associate encouraged the patient to take his blood pressure medication but he stated "No I just need to gain some weight to be healthier. I want to be 318 pounds."   Diagnosis:   DSM5: Schizophrenia Disorders:  Delusional Disorder (297.1) and Schizophrenia (295.7) Obsessive-Compulsive Disorders:   Trauma-Stressor Disorders:   Substance/Addictive Disorders:  Cannabis Use Disorder - Moderate 9304.30), Cocaine dependence Depressive Disorders:  Disruptive Mood Dysregulation Disorder (296.99) Total Time spent with patient: 20 minutes  Axis I: Schizoaffective Disorder            Cocaine dependence            Cannabis dependence Axis II: Cluster B Traits Axis III:  Past Medical History  Diagnosis Date  . Hypertension    Axis IV: other psychosocial or environmental problems and problems related to social environment Axis V: 21-30  behavior considerably influenced by delusions or hallucinations OR serious impairment in judgment, communication OR inability to function in almost all areas  ADL's:  Intact  Sleep: Fair  Appetite:  Fair  Suicidal Ideation:  Plan:  denies Intent:  denies Means:  denies Homicidal Ideation: denies  AEB (as evidenced by):  Psychiatric Specialty Exam: Physical Exam  Psychiatric: His affect is angry and labile. His speech is rapid and/or pressured and tangential. He is agitated, aggressive and actively hallucinating. Thought content is paranoid and delusional. He expresses impulsivity. He exhibits abnormal recent memory and abnormal remote memory.    Review of Systems  Constitutional: Negative.   HENT: Negative.   Eyes: Negative.   Respiratory: Negative.   Cardiovascular: Negative.   Gastrointestinal: Negative.   Genitourinary: Negative.   Musculoskeletal: Negative.   Skin: Negative.   Neurological: Negative.   Endo/Heme/Allergies: Negative.   Psychiatric/Behavioral: Positive for hallucinations and substance abuse (UDS on admission positive for cocaine/THC). Negative for depression, suicidal ideas and memory loss. The patient is nervous/anxious and has insomnia.     Blood pressure 135/85, pulse 65, temperature 97.7 F (36.5 C), temperature source Oral, resp. rate 16, height 6\' 1"  (1.854 m), weight 86.637 kg (191 lb).Body mass index is 25.2 kg/(m^2).  General Appearance: Fairly Groomed  Patent attorney::  Good  Speech:  Garbled and Pressured  Volume:  Increased  Mood:  Angry and Irritable  Affect:  Labile and Full Range  Thought Process:  Disorganized  Orientation:  Only to place and person  Thought Content:  Delusions and Hallucinations: Auditory  Suicidal  Thoughts:  No  Homicidal Thoughts:  No  Memory:  Immediate;   Poor Recent;   Poor Remote;   Poor  Judgement:  Poor  Insight:  Lacking  Psychomotor Activity:  Increased  Concentration:  Poor  Recall:  Poor  Fund of  Knowledge:Poor  Language: Fair  Akathisia:  No  Handed:  Right  AIMS (if indicated):     Assets:  Financial Resources/Insurance Housing  Sleep:  Number of Hours: 4.75   Musculoskeletal: Strength & Muscle Tone: within normal limits Gait & Station: normal Patient leans: N/A  Current Medications: Current Facility-Administered Medications  Medication Dose Route Frequency Provider Last Rate Last Dose  . acetaminophen (TYLENOL) tablet 650 mg  650 mg Oral Q6H PRN Shuvon Rankin, NP      . alum & mag hydroxide-simeth (MAALOX/MYLANTA) 200-200-20 MG/5ML suspension 30 mL  30 mL Oral Q4H PRN Shuvon Rankin, NP      . fluPHENAZine (PROLIXIN) tablet 10 mg  10 mg Oral BID PC Cleotis Nipper, MD      . gabapentin (NEURONTIN) capsule 600 mg  600 mg Oral BID Shuvon Rankin, NP   300 mg at 03/28/13 0754  . lactobacillus acidophilus & bulgar (LACTINEX) chewable tablet 1 tablet  1 tablet Oral TID WC Kerry Hough, PA-C   1 tablet at 03/23/13 1715  . lamoTRIgine (LAMICTAL) tablet 25 mg  25 mg Oral BID Cleotis Nipper, MD   25 mg at 03/27/13 0825  . [START ON 03/29/2013] lisinopril (PRINIVIL,ZESTRIL) tablet 20 mg  20 mg Oral Daily Eloyce Bultman      . magnesium hydroxide (MILK OF MAGNESIA) suspension 30 mL  30 mL Oral Daily PRN Shuvon Rankin, NP      . OLANZapine (ZYPREXA) tablet 15 mg  15 mg Oral BID Shondrea Steinert   7.5 mg at 03/28/13 0757  . traZODone (DESYREL) tablet 100 mg  100 mg Oral QHS Mamadou Breon      . trihexyphenidyl (ARTANE) tablet 5 mg  5 mg Oral BID WC Eleanor Gatliff   5 mg at 03/28/13 1610    Lab Results: No results found for this or any previous visit (from the past 48 hour(s)).  Physical Findings: AIMS: Facial and Oral Movements Muscles of Facial Expression: None, normal Lips and Perioral Area: None, normal Jaw: None, normal Tongue: None, normal,Extremity Movements Upper (arms, wrists, hands, fingers): None, normal Lower (legs, knees, ankles, toes): None, normal, Trunk  Movements Neck, shoulders, hips: None, normal, Overall Severity Severity of abnormal movements (highest score from questions above): None, normal Incapacitation due to abnormal movements: None, normal Patient's awareness of abnormal movements (rate only patient's report): No Awareness, Dental Status Current problems with teeth and/or dentures?: No Does patient usually wear dentures?: No  CIWA:  CIWA-Ar Total: 1 COWS:  COWS Total Score: 1  Treatment Plan Summary: Daily contact with patient to assess and evaluate symptoms and progress in treatment Medication management  Plan:1. Continue crisis management and stabilization. 2. Medication management to reduce current symptoms to base line and improve the patient's overall level of functioning 3. Treat health problems as indicated. Encourage patient to take Lisinopril for elevated blood pressure but has been refusing.  4. Develop treatment plan to decrease risk of relapse upon discharge and the need for readmission. 5. Psycho-social education regarding relapse prevention and self care. 6. Health care follow up as needed for medical problems. 7. Continue Zyprexa 15mg  po BID for psychosis,  Lamictal 25 mg twice a day for improved mood  stability and Prolixin 10 mg po BID for delusions and psychosis. Encouraged patient to be fully compliant with his medication regimen.   Medical Decision Making Problem Points:  Established problem, stable/improving (1), Review of last therapy session (1) and Review of psycho-social stressors (1) Data Points:  Order Aims Assessment (2) Review of medication regiment & side effects (2)  I certify that inpatient services furnished can reasonably be expected to improve the patient's condition.   Fransisca KaufmannDAVIS, LAURA, NP-C 03/28/2013, 1:55 PM   Patient seen, evaluated and I agree with notes by Nurse Practitioner. Thedore MinsMojeed Mearle Drew, MD

## 2013-03-29 MED ORDER — DIVALPROEX SODIUM 500 MG PO DR TAB
750.0000 mg | DELAYED_RELEASE_TABLET | Freq: Two times a day (BID) | ORAL | Status: DC
  Administered 2013-03-29 – 2013-04-04 (×12): 750 mg via ORAL
  Filled 2013-03-29 (×9): qty 1
  Filled 2013-03-29 (×2): qty 18
  Filled 2013-03-29 (×3): qty 1
  Filled 2013-03-29: qty 18
  Filled 2013-03-29 (×3): qty 1
  Filled 2013-03-29: qty 18
  Filled 2013-03-29 (×5): qty 1

## 2013-03-29 NOTE — Progress Notes (Signed)
Patient ID: Gregory Meza Mcbeth, male   DOB: 1965/11/22, 48 y.o.   MRN: [id number] [facility]  03/29/2013 10:18 AM Gregory Meza Wollman  MRN:  [id number] Subjective: " Let me make it clear to you, I am only going to take Artane and pink Depakote, those are the medications I get from my brother who is a psychologist like you. By the way, I am a police office, my batch # is [id number] and you can force me to take any other medications against my will.''  Objective:  Patient mood remains labile and irritable. He has a fixed delusions that medications giving to him are poison. He states that he can only trust his brother to give him his medications. He is acting bizarre and his thoughts process is disorganized. He continues to [facility], talking to himself as if responding to internal stimuli. Patient is clearly out of touch with reality. 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 He is Holiday representativeargumentative, belligerent, defiant and oppositional. He has been attending unit activities but is always disruptive.  Diagnosis:   DSM5: Schizophrenia Disorders:  Delusional Disorder (297.1) and Schizophrenia (295.7) Obsessive-Compulsive Disorders:   Trauma-Stressor Disorders:   Substance/Addictive Disorders:  Cannabis Use Disorder - Moderate 9304.30), Cocaine dependence Depressive Disorders:  Disruptive Mood Dysregulation Disorder (296.99) Total Time spent with patient: 20 minutes  Axis I: Schizoaffective Disorder            Cocaine dependence            Cannabis dependence Axis II: Cluster B Traits Axis III:  Past Medical History  Diagnosis Date  . Hypertension    Axis IV: other psychosocial or environmental problems and problems related to social environment Axis V: 21-30 behavior considerably influenced by delusions or hallucinations OR serious impairment in judgment, communication OR inability to function in almost all  areas  ADL's:  Intact  Sleep: Fair  Appetite:  Fair  Suicidal Ideation:  Plan:  denies Intent:  denies Means:  denies Homicidal Ideation: denies  AEB (as evidenced by):  Psychiatric Specialty Exam: Physical Exam  Psychiatric: His affect is angry and labile. His speech is rapid and/or pressured and tangential. He is agitated, aggressive and actively hallucinating. Thought content is paranoid and delusional. He expresses impulsivity. He exhibits abnormal recent memory and abnormal remote memory.    Review of Systems  Constitutional: Negative.   HENT: Negative.   Eyes: Negative.   Respiratory: Negative.   Cardiovascular: Negative.   Gastrointestinal: Negative.   Genitourinary: Negative.   Musculoskeletal: Negative.   Skin: Negative.   Neurological: Negative.   Endo/Heme/Allergies: Negative.   Psychiatric/Behavioral: Positive for hallucinations and substance abuse. The patient is nervous/anxious and has insomnia.     Blood pressure 135/85, pulse 65, temperature 97.7 F (36.5 C), temperature source Oral, resp. rate 16, height 6\' 1"  (1.854 m), weight 86.637 kg (191 lb).Body mass index is 25.2 kg/(m^2).  General Appearance: Fairly Groomed  Patent attorneyye Contact::  Good  Speech:  Garbled and Pressured  Volume:  Increased  Mood:  Angry and Irritable  Affect:  Labile and Full Range  Thought Process:  Disorganized  Orientation:  Only to place and person  Thought Content:  Delusions and Hallucinations: Auditory  Suicidal Thoughts:  No  Homicidal Thoughts:  No  Memory:  Immediate;   Poor Recent;   Poor Remote;   Poor  Judgement:  Poor  Insight:  Lacking  Psychomotor Activity:  Increased  Concentration:  Poor  Recall:  Poor  Fund of Knowledge:Poor  Language: Fair  Akathisia:  No  Handed:  Right  AIMS (if indicated):     Assets:  Financial Resources/Insurance Housing  Sleep:  Number of Hours: 5   Musculoskeletal: Strength & Muscle Tone: within normal limits Gait & Station:  normal Patient leans: N/A  Current Medications: Current Facility-Administered Medications  Medication Dose Route Frequency Provider Last Rate Last Dose  . acetaminophen (TYLENOL) tablet 650 mg  650 mg Oral Q6H PRN Shuvon Rankin, NP      . alum & mag hydroxide-simeth (MAALOX/MYLANTA) 200-200-20 MG/5ML suspension 30 mL  30 mL Oral Q4H PRN Shuvon Rankin, NP      . divalproex (DEPAKOTE) DR tablet 750 mg  750 mg Oral BID PC Raylen Ken      . fluPHENAZine (PROLIXIN) tablet 10 mg  10 mg Oral BID PC Cleotis Nipper, MD      . gabapentin (NEURONTIN) capsule 600 mg  600 mg Oral BID Shuvon Rankin, NP   300 mg at 03/28/13 0754  . lactobacillus acidophilus & bulgar (LACTINEX) chewable tablet 1 tablet  1 tablet Oral TID WC Kerry Hough, PA-C   1 tablet at 03/23/13 1715  . lisinopril (PRINIVIL,ZESTRIL) tablet 20 mg  20 mg Oral Daily Jaydrian Corpening      . magnesium hydroxide (MILK OF MAGNESIA) suspension 30 mL  30 mL Oral Daily PRN Shuvon Rankin, NP      . OLANZapine (ZYPREXA) tablet 15 mg  15 mg Oral BID Maire Govan   7.5 mg at 03/28/13 0757  . traZODone (DESYREL) tablet 100 mg  100 mg Oral QHS Asna Muldrow      . trihexyphenidyl (ARTANE) tablet 5 mg  5 mg Oral BID WC Milcah Dulany   5 mg at 03/29/13 0741    Lab Results: No results found for this or any previous visit (from the past 48 hour(s)).  Physical Findings: AIMS: Facial and Oral Movements Muscles of Facial Expression: None, normal Lips and Perioral Area: None, normal Jaw: None, normal Tongue: None, normal,Extremity Movements Upper (arms, wrists, hands, fingers): None, normal Lower (legs, knees, ankles, toes): None, normal, Trunk Movements Neck, shoulders, hips: None, normal, Overall Severity Severity of abnormal movements (highest score from questions above): None, normal Incapacitation due to abnormal movements: None, normal Patient's awareness of abnormal movements (rate only patient's report): No Awareness, Dental  Status Current problems with teeth and/or dentures?: No Does patient usually wear dentures?: No  CIWA:  CIWA-Ar Total: 1 COWS:  COWS Total Score: 1  Treatment Plan Summary: Daily contact with patient to assess and evaluate symptoms and progress in treatment Medication management  Plan:1. Admit for crisis management and stabilization. 2. Medication management to reduce current symptoms to base line and improve the  patient's overall level of functioning 3. Treat health problems as indicated. 4. Develop treatment plan to decrease risk of relapse upon discharge and the need for readmission. 5. Psycho-social education regarding relapse prevention and self care. 6. Health care follow up as needed for medical problems. 7. Initiate Depakote 750mg  po BID for mood stabilization. Patient to continue other medication regimen. 8. Patient encouraged to take his medications.  Medical Decision Making Problem Points:  Established problem, worsening (2), Review of last therapy session (1) and Review of psycho-social stressors (1) Data Points:  Decision to obtain old records (1) Order Aims Assessment (2) Review and summation of old records (2) Review  of medication regiment & side effects (2) Review of new medications or change in dosage (2)  I certify that inpatient services furnished can reasonably be expected to improve the patient's condition.   Thedore Mins, MD 03/29/2013, 10:18 AM

## 2013-03-29 NOTE — [facility] Group Notes (Signed)
[facility]   03/29/2013 10:19 AM  Participation Quality:  Engaged  Mood/Affect:  Excited  Depression Rating:  denies  Anxiety Rating:  denies  Thoughts of Suicide:  No Will you contract for safety?   NA  Current AVH:  Denies  Plan for Discharge/Comments:  "I am happy here.  I can't say about the future though.  I don't want to take any meds but Artane.  That's all I need."  Assured pt he would see Dr today so he could talk about meds.  Transportation Means: ACT  Supports: ACT  Daryel GeraldNorth, Chevonne Bostrom B

## 2013-03-29 NOTE — Progress Notes (Signed)
Patient ID: Gregory Meza, male   DOB: 03-31-1965, 48 y.o.   MRN: [id number] D: Patient remains suspicious and paranoid.  Patient became irrate when he clothes were searched.  He started banging on the door of the nurse's station and became extremely loud.  He was verbally threatening staff.  After he received his clothing, he did calm down.  He is also argumentative regarding his medication, selectively taking what he wants to.  He refuses his blood pressure being taken.  Patient is calm and cooperative at the moment.  A: continue to monitor patient's behavior and redirect as necessary.  Safety checks completed every 15 minutes per protocol.  R: patient is receptive to staff.

## 2013-03-29 NOTE — [facility] Group Notes (Signed)
[facility]  03/29/2013  12:47 PM  Type of Therapy:  [facility] Presentation   Participation Level:  Active  Participation Quality:  Attentive  Affect:  Defensive  Cognitive:  Disorganized  Insight:  Distracting  Engagement in Therapy:  Engaged  Modes of Intervention:  Discussion, Education and Socialization   Summary of Progress/Problems:  Onalee HuaDavid from [employer] [facility] came to present his recovery story and play the guitar.  [name] discussed with the speaker about using hallucinogenic drugs, legalizing marijuana, and his views on spiritiuality.  He interrupted group and asked the speaker about today's date.  Asked about the location of [facility], but got confused and thought that [facility] was an ACT team.  He talked about his self-talk, "I have one-way conversations with myself, and everyone thinks that I'm crazy, but I'm not.  I'm bipolar and it's a disease, but nobody understands."  He thanked the speaker for coming to group.    Simona Huhina Yang   03/29/2013  12:47 PM

## 2013-03-29 NOTE — Tx Team (Signed)
  Interdisciplinary Treatment Plan Update   Date Reviewed:  03/29/2013  Time Reviewed:  10:21 AM  Progress in Treatment:   Attending groups: Yes Participating in groups: Yes Taking medication as prescribed: Yes  Tolerating medication: Yes Family/Significant other contact made: Yes  Patient understands diagnosis: Yes  Discussing patient identified problems/goals with staff: Yes Medical problems stabilized or resolved: Yes Denies suicidal/homicidal ideation: Yes Patient has not harmed self or others: Yes  For review of initial/current patient goals, please see plan of care.  Estimated Length of Stay:  4-5 days  Reason for Continuation of Hospitalization: Delusions  Hallucinations Medication stabilization  New Problems/Goals identified:  N/A  Discharge Plan or Barriers:   return home, follow up with ACT team  Additional Comments:  Gregory Meza has been refusing all meds but Artane this week.  He continues as delusional, disorganized and bizarre.  Today agreed to take "the pink pill"  After mtg with Dr.  Darlen RoundPSI ACT will pick pt up from here.  Attendees:  Signature: Thedore MinsMojeed Akintayo, MD 03/29/2013 10:21 AM   Signature: Richelle Itood Josefina Rynders, LCSW 03/29/2013 10:21 AM  Signature: Fransisca KaufmannLaura Davis, NP 03/29/2013 10:21 AM  Signature: Joslyn Devonaroline Beaudry, RN 03/29/2013 10:21 AM  Signature: Liborio NixonPatrice White, RN 03/29/2013 10:21 AM  Signature:  03/29/2013 10:21 AM  Signature:   03/29/2013 10:21 AM  Signature:    Signature:    Signature:    Signature:    Signature:    Signature:      Scribe for Treatment Team:   Richelle Itood Maryah Marinaro, LCSW  03/29/2013 10:21 AM

## 2013-03-29 NOTE — Progress Notes (Signed)
Patient ID: Gregory Meza, male   DOB: Jun 25, 1965, 48 y.o.   MRN: [id number] D: Pt is awake and active on the unit this PM. Pt denies SI/HI and A/V hallucinations. Pt mood is irritable/restless and his affect is anxious. Pt speech is pressured and tangential with loose associations. Pt denies SI/HI and AVH, and is cooperative with staff. He has agreed to take Depakote, but no other psychotropic medication. Pt is present in the milieu and is getting along with his peers.   A: Encouraged pt to discuss feelings with staff and administered medication per MD orders. Writer also encouraged pt to participate in groups.  R: Writer will continue to monitor. 15 minute checks are ongoing for safety.

## 2013-03-30 NOTE — Progress Notes (Signed)
Patient ID: Gregory FriesGilbert J Abbasi, male   DOB: Jan 26, 1966, 48 y.o.   MRN[id number] [facility]  03/30/2013 3:14 PM Gregory Meza  MRN:  [id number] Subjective: Patient states "I'm like only taking Depakote and Artane. That is what my brother tells me is good. I took a medication class in prison too. I hope that being religious is important to you. I am healthy so I don't need my blood pressure checked."   Objective:  Patient mood remains labile and irritable. He is acting bizarre and his thoughts process is disorganized. [facility], talking to himself as if responding to internal stimuli. Patient remains resistant to taking medications that are recommended for him. He maintains that he only needs to take Depakote and Artane. Patient has become threatening when the subject of his medication compliance is brought up. Patient also refusing to have his blood pressure checked despite encouragement from Providers and nursing staff. Patient is attending groups but his contributions are often irrelevant. Social work Haematologiststaff report that today in group the patient was telling stories about police and aliens. So far his behavior on the unit has not been aggressive.   Diagnosis:   DSM5: Schizophrenia Disorders:  Delusional Disorder (297.1) and Schizophrenia (295.7) Obsessive-Compulsive Disorders:   Trauma-Stressor Disorders:   Substance/Addictive Disorders:  Cannabis Use Disorder - Moderate 9304.30), Cocaine dependence Depressive Disorders:  Disruptive Mood Dysregulation Disorder (296.99) Total Time spent with patient: 20 minutes  Axis I: Schizoaffective Disorder            Cocaine dependence            Cannabis dependence Axis II: Cluster B Traits Axis III:  Past Medical History  Diagnosis Date  . Hypertension    Axis IV: other psychosocial or environmental problems and problems related to social environment Axis V: 21-30 behavior considerably influenced by delusions or hallucinations  OR serious impairment in judgment, communication OR inability to function in almost all areas  ADL's:  Intact  Sleep: Fair  Appetite:  Fair  Suicidal Ideation:  Plan:  denies Intent:  denies Means:  denies Homicidal Ideation: denies  AEB (as evidenced by):  Psychiatric Specialty Exam: Physical Exam  Psychiatric: His affect is angry and labile. His speech is rapid and/or pressured and tangential. He is agitated, aggressive and actively hallucinating. Thought content is paranoid and delusional. He expresses impulsivity. He exhibits abnormal recent memory and abnormal remote memory.    Review of Systems  Constitutional: Negative.   HENT: Negative.   Eyes: Negative.   Respiratory: Negative.   Cardiovascular: Negative.   Gastrointestinal: Negative.   Genitourinary: Negative.   Musculoskeletal: Negative.   Skin: Negative.   Neurological: Negative.   Endo/Heme/Allergies: Negative.   Psychiatric/Behavioral: Positive for hallucinations, memory loss and substance abuse. Negative for depression and suicidal ideas. The patient is nervous/anxious and has insomnia.     Blood pressure 135/85, pulse 65, temperature 97.7 F (36.5 C), temperature source Oral, resp. rate 16, height 6\' 1"  (1.854 m), weight 86.637 kg (191 lb).Body mass index is 25.2 kg/(m^2).  General Appearance: Fairly Groomed  Patent attorneyye Contact::  Good  Speech:  Garbled and Pressured  Volume:  Increased  Mood:  Angry and Irritable  Affect:  Labile and Full Range  Thought Process:  Disorganized  Orientation:  Only to place and person  Thought Content:  Delusions and Hallucinations: Auditory  Suicidal Thoughts:  No  Homicidal Thoughts:  No  Memory:  Immediate;   Poor Recent;  Poor Remote;   Poor  Judgement:  Poor  Insight:  Lacking  Psychomotor Activity:  Increased  Concentration:  Poor  Recall:  Poor  Fund of Knowledge:Poor  Language: Fair  Akathisia:  No  Handed:  Right  AIMS (if indicated):     Assets:  Financial  Resources/Insurance Housing  Sleep:  Number of Hours: 3   Musculoskeletal: Strength & Muscle Tone: within normal limits Gait & Station: normal Patient leans: N/A  Current Medications: Current Facility-Administered Medications  Medication Dose Route Frequency Provider Last Rate Last Dose  . acetaminophen (TYLENOL) tablet 650 mg  650 mg Oral Q6H PRN Shuvon Rankin, NP      . alum & mag hydroxide-simeth (MAALOX/MYLANTA) 200-200-20 MG/5ML suspension 30 mL  30 mL Oral Q4H PRN Shuvon Rankin, NP      . divalproex (DEPAKOTE) DR tablet 750 mg  750 mg Oral BID PC Marci Polito   750 mg at 03/30/13 0749  . fluPHENAZine (PROLIXIN) tablet 10 mg  10 mg Oral BID PC Cleotis Nipper, MD      . gabapentin (NEURONTIN) capsule 600 mg  600 mg Oral BID Shuvon Rankin, NP   300 mg at 03/28/13 0754  . lactobacillus acidophilus & bulgar (LACTINEX) chewable tablet 1 tablet  1 tablet Oral TID WC Kerry Hough, PA-C   1 tablet at 03/23/13 1715  . lisinopril (PRINIVIL,ZESTRIL) tablet 20 mg  20 mg Oral Daily Margy Sumler      . magnesium hydroxide (MILK OF MAGNESIA) suspension 30 mL  30 mL Oral Daily PRN Shuvon Rankin, NP      . OLANZapine (ZYPREXA) tablet 15 mg  15 mg Oral BID Kalista Laguardia   7.5 mg at 03/28/13 0757  . traZODone (DESYREL) tablet 100 mg  100 mg Oral QHS Aletheia Tangredi      . trihexyphenidyl (ARTANE) tablet 5 mg  5 mg Oral BID WC Cathy Crounse   5 mg at 03/30/13 1610    Lab Results: No results found for this or any previous visit (from the past 48 hour(s)).  Physical Findings: AIMS: Facial and Oral Movements Muscles of Facial Expression: None, normal Lips and Perioral Area: None, normal Jaw: None, normal Tongue: None, normal,Extremity Movements Upper (arms, wrists, hands, fingers): None, normal Lower (legs, knees, ankles, toes): None, normal, Trunk Movements Neck, shoulders, hips: None, normal, Overall Severity Severity of abnormal movements (highest score from questions above): None,  normal Incapacitation due to abnormal movements: None, normal Patient's awareness of abnormal movements (rate only patient's report): No Awareness, Dental Status Current problems with teeth and/or dentures?: No Does patient usually wear dentures?: No  CIWA:  CIWA-Ar Total: 1 COWS:  COWS Total Score: 1  Treatment Plan Summary: Daily contact with patient to assess and evaluate symptoms and progress in treatment Medication management  Plan:1. Continue crisis management and stabilization. 2. Medication management to reduce current symptoms to base line and improve the patient's overall level of functioning 3. Treat health problems as indicated. Patient encouraged to take Lisinopril as ordered.  4. Develop treatment plan to decrease risk of relapse upon discharge and the need for readmission. 5. Psycho-social education regarding relapse prevention and self care. 6. Health care follow up as needed for medical problems. Staff will continue to attempt assessing patient's blood pressure daily.  7. Continue Depakote 750mg  po BID for mood stabilization. Patient encouraged to continue other medication regimen. 8. Patient encouraged to take his medications.  Medical Decision Making Problem Points:  Established problem, worsening (2),  Review of last therapy session (1) and Review of psycho-social stressors (1) Data Points:  Order Aims Assessment (2) Review of medication regiment & side effects (2) Review of new medications or change in dosage (2)  I certify that inpatient services furnished can reasonably be expected to improve the patient's condition.   Fransisca Kaufmann, NP-C 03/30/2013, 3:14 PM  Patient seen, evaluated and I agree with notes by Nurse Practitioner. Thedore Mins, MD

## 2013-03-30 NOTE — Progress Notes (Signed)
Adult Psychoeducational Group Note  Date:  03/30/2013 Time:  1:07 AM  Group Topic/Focus:  Wrap-Up Group:   The focus of this group is to help patients review their daily goal of treatment and discuss progress on daily workbooks.  Participation Level:  Active  Participation Quality:  Attentive and Monopolizing  Affect:  Anxious, Blunted and Not Congruent  Cognitive:  Disorganized and Confused  Insight: Lacking and Limited  Engagement in Group:  Engaged  Modes of Intervention:  Support  Additional Comments:  Pt's ideas were very unorganized and he mumbled to himself at numerous points of his dialogue. Pt stated what he learned today that was positive was that if he was to record himself that he would sound like he is talking to someone else because he is schizophrenic. He than started taking about his brother. Writer redirected pt and kept him on topic by asking him question about his fav color and what duties he may need for the night   Gregory Meza 03/30/2013, 1:07 AM

## 2013-03-30 NOTE — Progress Notes (Signed)
D. Pt has been up periodically during the evening. Pt did receive Depakote and Artane without incident but refused all other medications. Attempted medication education but pt became a bit agitated when the education was being attempted. A. Support and encouragement provided. R. Safety maintained, will continue to monitor.

## 2013-03-30 NOTE — Progress Notes (Signed)
Morning Wellness Group  The focus of this group is to educate the patient on the purpose and policies of crisis stabilization and provide a format to answer questions about their admission.  The group details unit policies and expectations of patients while admitted. Patient stated that his goal for the day was to work on being "happier."

## 2013-03-30 NOTE — Progress Notes (Signed)
D: Patient denies SI/HI and auditory and visual hallucinations. The patient has an anxious mood and affect. The patient is refusing to have his blood pressure taken and is refusing any medication except Depakote and Artane (patient states "this duo-combo works for me"). The patient was educated regarding the importance of these interventions. However, the patient still refuses. The patient is attending groups and interacting within the milieu.    A: Patient given emotional support from RN. Patient encouraged to come to staff with concerns and/or questions. Patient's medication routine continued. Patient's orders and plan of care reviewed. NP/MD notified about patient's refusal to have blood pressure checked and his medication refusal. No new orders were given.  R: Patient remains safe. Will continue to monitor patient q15 minutes for safety.

## 2013-03-30 NOTE — Progress Notes (Signed)
Didn't attend group 

## 2013-03-30 NOTE — [facility] Group Notes (Signed)
[facility] Group Notes:  (Counselor/Nursing/MHT/Case Management/Adjunct)  03/30/2013 1:15PM  Type of Therapy:  Group Therapy  Participation Level:  Active  Participation Quality:  Appropriate  Affect:  Pleasant  Cognitive:  Disorganized  Insight:  Limited  Engagement in Group:  Limited  Engagement in Therapy:  Limited  Modes of Intervention:  Discussion, Exploration and Socialization  Summary of Progress/Problems: The topic for group was balance in life.  Pt participated in the discussion about when their life was in balance and out of balance and how this feels.  Pt discussed ways to get back in balance and short term goals they can work on to get where they want to be."I have to make amends to people when I have done something to upset them."   Gregory Meza went on to tell Gregory Meza several stories that came to his mind-involved weed, police, relatives and aliens.   Gregory Meza, Gregory Meza 03/30/2013 1:24 PM

## 2013-03-30 NOTE — Progress Notes (Signed)
Pt was observed lying in his bed awake on first round after shift change.  Pt wanted RN to sit down in chair beside the bed.  Told pt that writer was doing rounds and just wanted to make sure pt was ok.  Pt stated he was ok, but again, wanted RN to sit with him.  Writer left the room at that point and encouraged pt to try to rest so that he could go to sleep.  Previous shift nurse reported that pt had refused his scheduled sleep aid, saying he did not need it.  On next check, this writer took the Trazodone back to the pt to see if he would take the medication.  Pt stated he did not need the med, and that he would go to sleep after while.  He said all he needed was a good woman to go to bed with him and that she didn't need to be a virgin, because he wasn't a virgin.  Writer attempted to redirect pt's direction of thought.  Pt was again encouraged to get some rest since he refused to accept the medication.  Safety maintained with q15 minute checks.

## 2013-03-31 NOTE — [facility] Group Notes (Signed)
[facility] LCSW Group Therapy  03/31/2013 1:04 PM  Type of Therapy:  Group Therapy   Participation Level:  Active  Participation Quality:  Attentive  Affect:  Appropriate  Cognitive:  Disorganized  Insight:  Lacking  Engagement in Therapy:  Engaged  Modes of Intervention:  Discussion and Socialization   Summary of Progress/Problems:  Chaplain was here to lead a group on themes of hope and courage. Gregory Meza had very disorganized and bizarre thoughts throughout group.  Gregory Meza defined community as "people that you surround yourself with.  It could be neighborhoods, or inpatient and outpatient facilities."  He stated that a community must be safe.    When asked what he learned in group, Gregory Meza stated "wherever you go, everyone is alike."    Gregory Meza   03/31/2013  1:04 PM

## 2013-03-31 NOTE — Progress Notes (Signed)
Patient ID: Gregory Meza, male   DOB: 1965-10-19, 48 y.o.   MRN: [id number] D)   Pt was lying down resting this evening, attended group and stayed in dayroom for awhile to watch tv and have a snack.  Stated he was having a good evening, has been pleasant,  denied need for trazodone, preoccupied, went to his room and has gone to bed for the evening, voiced no c/o's or needs. A)  Will continue to monitor for safety, continue POC R)  Safety maintained.

## 2013-03-31 NOTE — Progress Notes (Signed)
Pt refused his morning vitals.
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
## 2013-03-31 NOTE — Progress Notes (Signed)
D: Patient denies SI/HI and auditory and visual hallucinations. The patient still refuses all medications except depakote and artane. The patient is attending groups and participating within the milieu. The patient states that he feels his "drug mixture" is "working for him" and that he "doesn't need any other medication."  A: Patient given emotional support from RN. Patient encouraged to come to staff with concerns and/or questions. Patient's medication routine continued. Patient's orders and plan of care reviewed. MD/NP aware of patient's refusal to have his blood pressure taken and to take other medication other than artane and depakote (no new orders were given).  R: Patient remains cooperative. Will continue to monitor patient q15 minutes for safety.

## 2013-03-31 NOTE — [facility] Group Notes (Signed)
[facility]   03/31/2013 9:36 AM  Participation Quality:  Engaged  Mood/Affect:  Flat  Depression Rating:  denies  Anxiety Rating:  denies  Thoughts of Suicide:  No Will you contract for safety?   NA  Current AVH:  No  Plan for Discharge/Comments:  Was a bit more subdued this AM.  Mood good.  States his brother brought him some clothes a while ago, but did not visit.  Wants to call brother with me today.  Transportation Means: ACT team  Supports:  ACT team   Daryel GeraldNorth, Jori Thrall B

## 2013-03-31 NOTE — Progress Notes (Signed)
Adult Psychoeducational Group Note  Date:  03/31/2013 Time:  10:00AM  Group Topic/Focus:  Self Care:   The focus of this group is to help patients understand the importance of self-care in order to improve or restore emotional, physical, spiritual, interpersonal, and financial health.  Participation Level:  Active  Participation Quality:  Sharing  Affect:  Appropriate  Cognitive:  Disorganized  Insight: Limited  Engagement in Group:  Engaged  Modes of Intervention:  Discussion  Additional Comments:  Pt was attentive and shared during the group session remaining on task, however his thoughts and shared comments were disorganized and were expressed in a confusing manner. Pt did remain in group the entire time and was able to refrain from rambling when his peers shared. Pt was unable to clearly answer the questions presented in the group.   Zacarias PontesSmith, Brenya Taulbee R 03/31/2013, 10:47 AM

## 2013-04-01 NOTE — Progress Notes (Signed)
Patient ID: Gregory Meza, male   DOB: 27-Jul-1965, 48 y.o.   MRN: [id number] Psychoeducational Group Note  Date:  04/01/2013 Time:0930am  Group Topic/Focus:  Identifying Needs:   The focus of this group is to help patients identify their personal needs that have been historically problematic and identify healthy behaviors to address their needs.  Participation Level:  Active  Participation Quality:  Drowsy  Affect:  Lethargic and Resistant  Cognitive:  Delusional and Lacking  Insight:  Limited  Engagement in Group:  Limited  Additional Comments:  Healthy coping skills.   Gregory Meza, Gregory Meza 04/01/2013,10:56 AM

## 2013-04-01 NOTE — Progress Notes (Signed)
Patient ID: Gregory FriesGilbert J Jirak, male   DOB: 1965/08/02, 48 y.o.   MRN: [id number]  [facility]  04/01/2013 2:13 PM Gregory Meza  MRN:  [id number] Subjective: " Can I go home today? I need to go home and do [place] with my family.'' Objective:  Patient who reports decreased mood swings and irritability. He is only accepting Depakote and Artane. He thinks antipsychotic is poison. His thought process remains bizarre and disorganized. [facility] and talking to himself as if responding to internal stimuli.  He is argumentative, belligerent, defiant and oppositional.  has been attending unit activities but is always disruptive.  Diagnosis:   DSM5: Schizophrenia Disorders:  Delusional Disorder (297.1) and Schizophrenia (295.7) Obsessive-Compulsive Disorders:   Trauma-Stressor Disorders:   Substance/Addictive Disorders:  Cannabis Use Disorder - Moderate 9304.30), Cocaine dependence Depressive Disorders:  Disruptive Mood Dysregulation Disorder (296.99) Total Time spent with patient: 20 minutes  Axis I: Schizoaffective Disorder            Cocaine dependence            Cannabis dependence Axis II: Cluster B Traits Axis III:  Past Medical History  Diagnosis Date  . Hypertension    Axis IV: other psychosocial or environmental problems and problems related to social environment Axis V: 21-30 behavior considerably influenced by delusions or hallucinations OR serious impairment in judgment, communication OR inability to function in almost all areas  ADL's:  Intact  Sleep: Fair  Appetite:  Fair  Suicidal Ideation:  Plan:  denies Intent:  denies Means:  denies Homicidal Ideation: denies  AEB (as evidenced by):  Psychiatric Specialty Exam: Physical Exam  Psychiatric: His affect is angry and labile. His speech is rapid and/or pressured and tangential. He is agitated, aggressive and actively hallucinating. Thought content is paranoid and delusional. He expresses  impulsivity. He exhibits abnormal recent memory and abnormal remote memory.    Review of Systems  Constitutional: Negative.   HENT: Negative.   Eyes: Negative.   Respiratory: Negative.   Cardiovascular: Negative.   Gastrointestinal: Negative.   Genitourinary: Negative.   Musculoskeletal: Negative.   Skin: Negative.   Neurological: Negative.   Endo/Heme/Allergies: Negative.   Psychiatric/Behavioral: Positive for hallucinations and substance abuse. The patient is nervous/anxious.     Blood pressure 135/85, pulse 65, temperature 97.7 F (36.5 C), temperature source Oral, resp. rate 16, height 6\' 1"  (1.854 m), weight 86.637 kg (191 lb).Body mass index is 25.2 kg/(m^2).  General Appearance: Fairly Groomed  Patent attorneyye Contact::  Good  Speech:  Garbled and Pressured  Volume:  Increased  Mood:  Angry and Irritable  Affect:  Labile and Full Range  Thought Process:  Disorganized  Orientation:  Only to place and person  Thought Content:  Delusions and Hallucinations: Auditory  Suicidal Thoughts:  No  Homicidal Thoughts:  No  Memory:  Immediate;   Poor Recent;   Poor Remote;   Poor  Judgement:  Poor  Insight:  Lacking  Psychomotor Activity:  Increased  Concentration:  Poor  Recall:  Poor  Fund of Knowledge:Poor  Language: Fair  Akathisia:  No  Handed:  Right  AIMS (if indicated):     Assets:  Financial Resources/Insurance Housing  Sleep:  Number of Hours: 6.25   Musculoskeletal: Strength & Muscle Tone: within normal limits Gait & Station: normal Patient leans: N/A  Current Medications: Current Facility-Administered Medications  Medication Dose Route Frequency Provider Last Rate Last Dose  . acetaminophen (TYLENOL) tablet 650 mg  650 mg Oral Q6H PRN Shuvon Rankin, NP      . alum & mag hydroxide-simeth (MAALOX/MYLANTA) 200-200-20 MG/5ML suspension 30 mL  30 mL Oral Q4H PRN Shuvon Rankin, NP      . divalproex (DEPAKOTE) DR tablet 750 mg  750 mg Oral BID PC Jaqlyn Gruenhagen   750 mg  at 04/01/13 0831  . fluPHENAZine (PROLIXIN) tablet 10 mg  10 mg Oral BID PC Cleotis Nipper, MD      . gabapentin (NEURONTIN) capsule 600 mg  600 mg Oral BID Shuvon Rankin, NP   300 mg at 03/28/13 0754  . lactobacillus acidophilus & bulgar (LACTINEX) chewable tablet 1 tablet  1 tablet Oral TID WC Kerry Hough, PA-C   1 tablet at 03/23/13 1715  . lisinopril (PRINIVIL,ZESTRIL) tablet 20 mg  20 mg Oral Daily Taydem Cavagnaro      . magnesium hydroxide (MILK OF MAGNESIA) suspension 30 mL  30 mL Oral Daily PRN Shuvon Rankin, NP      . OLANZapine (ZYPREXA) tablet 15 mg  15 mg Oral BID Kaveri Perras   7.5 mg at 03/28/13 0757  . traZODone (DESYREL) tablet 100 mg  100 mg Oral QHS Kodee Ravert      . trihexyphenidyl (ARTANE) tablet 5 mg  5 mg Oral BID WC Amali Uhls   5 mg at 04/01/13 0831    Lab Results: No results found for this or any previous visit (from the past 48 hour(s)).  Physical Findings: AIMS: Facial and Oral Movements Muscles of Facial Expression: None, normal Lips and Perioral Area: None, normal Jaw: None, normal Tongue: None, normal,Extremity Movements Upper (arms, wrists, hands, fingers): None, normal Lower (legs, knees, ankles, toes): None, normal, Trunk Movements Neck, shoulders, hips: None, normal, Overall Severity Severity of abnormal movements (highest score from questions above): None, normal Incapacitation due to abnormal movements: None, normal Patient's awareness of abnormal movements (rate only patient's report): No Awareness, Dental Status Current problems with teeth and/or dentures?: No Does patient usually wear dentures?: No  CIWA:  CIWA-Ar Total: 1 COWS:  COWS Total Score: 1  Treatment Plan Summary: Daily contact with patient to assess and evaluate symptoms and progress in treatment Medication management  Plan:1. Admit for crisis management and stabilization. 2. Medication management to reduce current symptoms to base line and improve the  patient's  overall level of functioning 3. Treat health problems as indicated. 4. Develop treatment plan to decrease risk of relapse upon discharge and the need for readmission. 5. Psycho-social education regarding relapse prevention and self care. 6. Health care follow up as needed for medical problems. 7. continue Depakote 750mg  po BID for mood stabilization. Patient to continue other medication regimen. 8. Patient encouraged to take his medications.  Medical Decision Making Problem Points:  Established problem, improving (1), Review of last therapy session (1) and Review of psycho-social stressors (1) Data Points:  Decision to obtain old records (1) Order Aims Assessment (2) Review and summation of old records (2) Review of medication regiment & side effects (2) Review of new medications or change in dosage (2)  I certify that inpatient services furnished can reasonably be expected to improve the patient's condition.   Thedore Mins, MD 04/01/2013, 2:13 PM

## 2013-04-01 NOTE — Progress Notes (Signed)
Patient ID: Gregory Meza, male   DOB: [birth year], 48 y.o.   MRN: [id number] D)  [name] was lying on his bed this evening, with his head at the foot of the bed so that he could watch the hall.  He said hello when I walked in, and asked about my day.  Stated he has had a good day but was tired, didn't want to go to group this evening, didn't want a snack, just wanted to go to bed early tonight.  Was offered trazodone to help with sleep but he refused, said his doctor told him he didn't need it, and he could sleep without it.  Was calm in his refusal, stated he only was taking what he absolutely needed,  Turned over and faced away from me until I left. A)  Will continue monitor for safety, encouraged to try other meds ordered, continue POC R)  Safety maintained.

## 2013-04-01 NOTE — [facility] Group Notes (Signed)
[facility] Group Notes:  (Clinical Social Work)  04/01/2013  11:00-11:45AM  Summary of Progress/Problems:   The main focus of today's process group was for the patient to identify ways in which they have in the past sabotaged their own recovery and reasons they may have done this/what they received from doing it.  We then worked to identify a specific plan to avoid doing this when discharged from the hospital for this admission.  The patient expressed some rather disorganized in response to CSW asking what he will need to do when he gets out of the hospital in order to stay well.  He talked at length about his cousin, being jealous, needing to be careful of the jealous spirit his cousin has, "seeking so can find."  He is calmer and not as disheveled as seen by this CSW 6 days ago, but remains disorganized in his thought processes.  Type of Therapy:  Group Therapy - Process  Participation Level:  Minimal  Participation Quality:  Attentive and Sharing  Affect:  Blunted  Cognitive:  Disorganized  Insight:  Limited  Engagement in Therapy:  Limited  Modes of Intervention:  Clarification, Education, Exploration, Discussion  Ambrose MantleMareida Grossman-Orr, LCSW 04/01/2013, 12:37 PM

## 2013-04-01 NOTE — Progress Notes (Signed)
Psychoeducational Group Note  Date:  04/01/2013 Time:  8:00 p.m.   Group Topic/Focus:  Wrap-Up Group:   The focus of this group is to help patients review their daily goal of treatment and discuss progress on daily workbooks.  Participation Level: Did Not Attend  Participation Quality:  Not Applicable  Affect:  Not Applicable  Cognitive:  Not Applicable  Insight:  Not Applicable  Engagement in Group: Not Applicable  Additional Comments: The patient didn't attend group this evening since he was asleep in his bed.   Makennah Omura S 04/01/2013, 11:40 PM

## 2013-04-01 NOTE — Progress Notes (Signed)
[facility] Group Notes:  (Nursing/MHT/Case Management/Adjunct)  Date:  04/01/2013  Time:  8:00 p.m.   Type of Therapy:  Psychoeducational Skills  Participation Level:  Active  Participation Quality:  Attentive  Affect:  Blunted  Cognitive:  Appropriate  Insight:  Appropriate  Engagement in Group:  Engaged  Modes of Intervention:  Education  Summary of Progress/Problems: The patient shared with the group that he had a pretty good day overall. He indicated that he is feeling better due to the medication. In addition, he described his current state as an "even keel". In terms of the theme of the day, his coping skill is to stay at home.   Kriti Katayama S 04/01/2013, 12:09 AM

## 2013-04-01 NOTE — Progress Notes (Signed)
Patient ID: Gregory Meza, male   DOB: 1965/05/25, 48 y.o.   MRN: [id number] DATA:  Patient was cooperative this morning showing mark improvement in behavior.  Patient was able to fill out his self inventory sheet without difficulty.  Patient continues to refuse vital signs and will only take psychotropic medications at this time refusing other medications because he states "brother" (who is a psychiatrist)" told me what to take."  Patient states he sleeping well and that his appetite is good.  Patient continues to feel depressed and hopeless.  Patient denies suicidal or homicidal ideations.  Action:  Patient encouraged to go to group therapy meeting today.  Patient encouraged to take medications as prescribed.  Patient educated on hypertension and the importance of taking his blood pressure medications.  Staff monitoring the patient on 15 minute checks.  Patient renewed to be on a single room basis in a do not admit room.  Response:  Patient attended psycoeducational group meeting today.  Patient cooperative with taking only some of his medications, refusing others.  Will continue to encourage and educate patient on taking meds.  Will continue to monitor the patient for 15 minute checks.

## 2013-04-01 NOTE — Progress Notes (Signed)
Patient ID: Gregory Meza, male   DOB: 1965/12/24, 48 y.o.   MRN: [id number] Psychoeducational Group Note  Date:  04/01/2013 Time:0900am  Group Topic/Focus:  Identifying Needs:   The focus of this group is to help patients identify their personal needs that have been historically problematic and identify healthy behaviors to address their needs.  Participation Level:  Active  Participation Quality:  Appropriate  Affect:  Appropriate  Cognitive:  Delusional and Lacking  Insight:  Limited  Engagement in Group:  Limited  Additional Comments:  Inventory group   Valente DavidWeaver, Gregory Meza 04/01/2013,10:55 AM

## 2013-04-02 NOTE — Progress Notes (Addendum)
Patient ID: Gregory Meza, male   DOB: 1965/09/26, 48 y.o.   MRN: [id number] DATA:  Patient continues to show improvement.  Slept well last night and reports an improving appetite.  Patient does remain isolative and did not go to group psychoeducational meeting last evening spending most of his time in his room asleep.  Patient continues to refuse most of his medications taking only depakote and artane  this morning.  He was able to fill out his self inventory sheet this morning with much encouragement from nurse. Patient without any physical complaints and denies suicidal or homicidal ideations.   ACTION:  Patient given medications as prescribed and encouraged to take medications that he has been refusing.  Patient encouraged to attend all scheduled group psychoeducational meetings.  Patient remains on 15 minutes checks.   RESPONSE:  Patient taking only depakote and artane and refusing all other medications.  He did attend group psychoeducational meeting the morning but did not participate much and was seen ofter doing hand gestures in the meeting.  Will continue 15 minutes checks.

## 2013-04-02 NOTE — Progress Notes (Signed)
Patient ID: Gregory Meza, male   DOB: [birth year], 48 y.o.   MRN: [id number] Psychoeducational Group Note  Date:  04/02/2013 Time:  0930am  Group Topic/Focus:  Making Healthy Choices:   The focus of this group is to help patients identify negative/unhealthy choices they were using prior to admission and identify positive/healthier coping strategies to replace them upon discharge.  Participation Level:  Minimal  Participation Quality:  Inattentive  Affect:  Flat  Cognitive:  Disorganized  Insight:  Resistant  Engagement in Group:  Resistant  Additional Comments:  Inventory group   Valente DavidWeaver, Janiya Millirons Brooks 04/02/2013,9:51 AM

## 2013-04-02 NOTE — Progress Notes (Signed)
Patient ID: Gregory FriesGilbert J Demetriou, male   DOB: 1965/07/05, 48 y.o.   MRN: [id number] Psychoeducational Group Note  Date:  04/02/2013 Time:  0930am  Group Topic/Focus:  Making Healthy Choices:   The focus of this group is to help patients identify negative/unhealthy choices they were using prior to admission and identify positive/healthier coping strategies to replace them upon discharge.  Participation Level:  Minimal  Participation Quality:  Inattentive  Affect:  Flat  Cognitive:  Disorganized  Insight:  Resistant  Engagement in Group:  Resistant  Additional Comments:  Healthy support systems.   Valente DavidWeaver, Dniyah Grant Brooks 04/02/2013,9:52 AM

## 2013-04-02 NOTE — Progress Notes (Signed)
Psychoeducational Group Note  Date:  04/02/2013 Time:  8:00 p.m.  Group Topic/Focus:  Wrap-Up Group:   The focus of this group is to help patients review their daily goal of treatment and discuss progress on daily workbooks.  Participation Level: Did Not Attend  Participation Quality:  Not Applicable  Affect:  Not Applicable  Cognitive:  Not Applicable  Insight:  Not Applicable  Engagement in Group: Not Applicable  Additional Comments:  The patient did not attend group this evening since he refused to participate.   [name] S 04/02/2013, 11:03 PM

## 2013-04-02 NOTE — [facility] Group Notes (Signed)
[facility] Group Notes:  (Clinical Social Work)  04/02/2013   11:15am-12:00pm  Summary of Progress/Problems:  The main focus of today's process group was to listen to a variety of genres of music and to identify that different types of music provoke different responses.  The patient then was able to identify personally what was soothing for them, as well as energizing.  Handouts were used to record feelings evoked, as well as how patient can personally use this knowledge in sleep habits, with depression, and with other symptoms.  The patient expressed understanding of concepts, as well as knowledge of how each type of music affected him and how this can be used at home as a wellness/recovery tool.  He remained religiously preoccupied, presented with flat affect from start to finish of group.  Type of Therapy:  Music Therapy   Participation Level:  Active  Participation Quality:  Attentive  Affect:  Flat  Cognitive:  Alert  Insight:  Improved  Engagement in Therapy:  Engaged  Modes of Intervention:   Activity, Exploration  Ambrose MantleMareida Grossman-Orr, LCSW 04/02/2013, 12:30pm

## 2013-04-02 NOTE — Progress Notes (Signed)
Patient ID: Gregory Meza, male   DOB: 05-13-1965, 48 y.o.   MRN: [id number]  [facility] MD Progress Note  04/02/2013 10:30 AM MASEN LUALLEN  MRN:  [id number] Subjective: " do you want my brother to pick me up tomorrow or my ACT team on Tuesday?'' Objective:  Patient who is partially compliant with his medication. He  is only accepting Depakote and Artane. He is no longer agitated or labile but his thought process remains disorganized and labile. He has a fixed delusion that some of his medications are poison. He continues to talk to himself as if responding to internal stimuli. However, he has become less intrusive, defiant and oppositional. He is attending the unit milieu but could be disruptive. He denies cravings for drugs or alcohol. Diagnosis:   DSM5: Schizophrenia Disorders:  Delusional Disorder (297.1) and Schizophrenia (295.7) Obsessive-Compulsive Disorders:   Trauma-Stressor Disorders:   Substance/Addictive Disorders:  Cannabis Use Disorder - Moderate 9304.30), Cocaine dependence Depressive Disorders:  Disruptive Mood Dysregulation Disorder (296.99) Total Time spent with patient: 20 minutes  Axis I: Schizoaffective Disorder            Cocaine dependence            Cannabis dependence Axis II: Cluster B Traits Axis III:  Past Medical History  Diagnosis Date  . Hypertension    Axis IV: other psychosocial or environmental problems and problems related to social environment Axis V: 30-40  ADL's:  Intact  Sleep: Fair  Appetite:  Fair  Suicidal Ideation:  Plan:  denies Intent:  denies Means:  denies Homicidal Ideation: denies  AEB (as evidenced by):  Psychiatric Specialty Exam: Physical Exam  Psychiatric: His affect is labile. His speech is rapid and/or pressured and tangential. He is aggressive and actively hallucinating. Thought content is paranoid. He expresses impulsivity. He exhibits abnormal recent memory and abnormal remote memory.    Review of Systems   Constitutional: Negative.   HENT: Negative.   Eyes: Negative.   Respiratory: Negative.   Cardiovascular: Negative.   Gastrointestinal: Negative.   Genitourinary: Negative.   Musculoskeletal: Negative.   Skin: Negative.   Neurological: Negative.   Endo/Heme/Allergies: Negative.   Psychiatric/Behavioral: Positive for hallucinations and substance abuse. The patient is nervous/anxious.     Blood pressure 135/85, pulse 65, temperature 97.7 F (36.5 C), temperature source Oral, resp. rate 16, height 6\' 1"  (1.854 m), weight 86.637 kg (191 lb).Body mass index is 25.2 kg/(m^2).  General Appearance: Fairly Groomed  Patent attorney::  Good  Speech:  Garbled and Pressured  Volume:  Increased  Mood:   Irritable  Affect:  Labile and Full Range  Thought Process:  Disorganized  Orientation:  Only to place and person  Thought Content:  Delusions and Hallucinations: Auditory  Suicidal Thoughts:  No  Homicidal Thoughts:  No  Memory:  Immediate;   Poor Recent;   Poor Remote;   Poor  Judgement:  Poor  Insight:  Lacking  Psychomotor Activity:  Increased  Concentration:  Poor  Recall:  Poor  Fund of Knowledge:Poor  Language: Fair  Akathisia:  No  Handed:  Right  AIMS (if indicated):     Assets:  Financial Resources/Insurance Housing  Sleep:  Number of Hours: 6.75   Musculoskeletal: Strength & Muscle Tone: within normal limits Gait & Station: normal Patient leans: N/A  Current Medications: Current Facility-Administered Medications  Medication Dose Route Frequency Provider Last Rate Last Dose  . acetaminophen (TYLENOL) tablet 650 mg  650 mg Oral Q6H PRN Shuvon  Rankin, NP      . alum & mag hydroxide-simeth (MAALOX/MYLANTA) 200-200-20 MG/5ML suspension 30 mL  30 mL Oral Q4H PRN Shuvon Rankin, NP      . divalproex (DEPAKOTE) DR tablet 750 mg  750 mg Oral BID PC Cristina Ceniceros   750 mg at 04/02/13 0808  . fluPHENAZine (PROLIXIN) tablet 10 mg  10 mg Oral BID PC Cleotis NipperSyed T Arfeen, MD      .  gabapentin (NEURONTIN) capsule 600 mg  600 mg Oral BID Shuvon Rankin, NP   300 mg at 03/28/13 0754  . lactobacillus acidophilus & bulgar (LACTINEX) chewable tablet 1 tablet  1 tablet Oral TID WC Kerry HoughSpencer E Simon, PA-C   1 tablet at 03/23/13 1715  . lisinopril (PRINIVIL,ZESTRIL) tablet 20 mg  20 mg Oral Daily Dashley Monts      . magnesium hydroxide (MILK OF MAGNESIA) suspension 30 mL  30 mL Oral Daily PRN Shuvon Rankin, NP      . OLANZapine (ZYPREXA) tablet 15 mg  15 mg Oral BID Briar Sword   7.5 mg at 03/28/13 0757  . traZODone (DESYREL) tablet 100 mg  100 mg Oral QHS Joseguadalupe Stan      . trihexyphenidyl (ARTANE) tablet 5 mg  5 mg Oral BID WC Auburn Hester   5 mg at 04/02/13 [id number]    Lab Results: No results found for this or any previous visit (from the past 48 hour(s)).  Physical Findings: AIMS: Facial and Oral Movements Muscles of Facial Expression: None, normal Lips and Perioral Area: None, normal Jaw: None, normal Tongue: None, normal,Extremity Movements Upper (arms, wrists, hands, fingers): None, normal Lower (legs, knees, ankles, toes): None, normal, Trunk Movements Neck, shoulders, hips: None, normal, Overall Severity Severity of abnormal movements (highest score from questions above): None, normal Incapacitation due to abnormal movements: None, normal Patient's awareness of abnormal movements (rate only patient's report): No Awareness, Dental Status Current problems with teeth and/or dentures?: No Does patient usually wear dentures?: No  CIWA:  CIWA-Ar Total: 1 COWS:  COWS Total Score: 1  Treatment Plan Summary: Daily contact with patient to assess and evaluate symptoms and progress in treatment Medication management  Plan:1. Admit for crisis management and stabilization. 2. Medication management to reduce current symptoms to base line and improve the  patient's overall level of functioning 3. Treat health problems as indicated. 4. Develop treatment plan to  decrease risk of relapse upon discharge and the need for readmission. 5. Psycho-social education regarding relapse prevention and self care. 6. Health care follow up as needed for medical problems. 7. continue Depakote 750mg  po BID for mood stabilization. Patient to continue other medication regimen. 8. Patient encouraged to take his medications. 9. Valproic acid level on 04/03/13  Medical Decision Making Problem Points:  Established problem, improving (1), Review of last therapy session (1) and Review of psycho-social stressors (1) Data Points:  Decision to obtain old records (1) Order Aims Assessment (2) Review and summation of old records (2) Review of medication regiment & side effects (2) Review of new medications or change in dosage (2)  I certify that inpatient services furnished can reasonably be expected to improve the patient's condition.   Thedore MinsAkintayo, Lenix Kidd, MD 04/02/2013, 10:30 AM

## 2013-04-03 LAB — VALPROIC ACID LEVEL: Valproic Acid Lvl: 62.4 ug/mL (ref 50.0–100.0)

## 2013-04-03 NOTE — Progress Notes (Signed)
Pt reports he is doing ok.  He says he is probably going home tomorrow.  He denies SI/HI/AV, although he still seems to be responding to internal stimuli.  He tends to be withdrawn for the most part and isolating to his room.  Pt is still refusing all his meds except the Depakote and the Artane.  He will be picked up sometime tomorrow by his ACT team and taken to his apartment which is beside his brother.  Pt encouraged to make his needs known to staff.  Support and encouragement offered.  Safety maintained with q15 minute checks.

## 2013-04-03 NOTE — Progress Notes (Signed)
Patient ID: Gregory Meza, male   DOB: 12-02-1965, 48 y.o.   MRN: [id number]  [facility] MD Progress Note  04/03/2013 9:57 AM DAKAI BRAITHWAITE  MRN:  [id number] Subjective: " I am not going to take all those medications you put me on but Artane and Depakote,'' Objective:  Patient with minimal insight into his mental illness who is partially compliant with his medication. He  is only accepting Depakote and Artane. He endorsed decreased mood lability or agitation. He continues to have  a fixed delusion that some of his medications are poison. Patient regularly talks  to himself as if responding to internal stimuli. He is less intrusive, defiant and oppositional. He is attending the unit milieu but could be disruptive. He denies cravings for drugs or alcohol. Diagnosis:   DSM5: Schizophrenia Disorders:  Delusional Disorder (297.1) and Schizophrenia (295.7) Obsessive-Compulsive Disorders:   Trauma-Stressor Disorders:   Substance/Addictive Disorders:  Cannabis Use Disorder - Moderate 9304.30), Cocaine dependence Depressive Disorders:  Disruptive Mood Dysregulation Disorder (296.99) Total Time spent with patient: 20 minutes  Axis I: Schizoaffective Disorder            Cocaine dependence            Cannabis dependence Axis II: Cluster B Traits Axis III:  Past Medical History  Diagnosis Date  . Hypertension    Axis IV: other psychosocial or environmental problems and problems related to social environment Axis V: 40-50  ADL's:  Intact  Sleep: Fair  Appetite:  Fair  Suicidal Ideation:  Plan:  denies Intent:  denies Means:  denies Homicidal Ideation: denies  AEB (as evidenced by):  Psychiatric Specialty Exam: Physical Exam  Psychiatric: His affect is labile. His speech is rapid and/or pressured and tangential. He is aggressive and actively hallucinating. Thought content is paranoid. He expresses impulsivity. He exhibits abnormal recent memory and abnormal remote memory.    Review of  Systems  Constitutional: Negative.   HENT: Negative.   Eyes: Negative.   Respiratory: Negative.   Cardiovascular: Negative.   Gastrointestinal: Negative.   Genitourinary: Negative.   Musculoskeletal: Negative.   Skin: Negative.   Neurological: Negative.   Endo/Heme/Allergies: Negative.   Psychiatric/Behavioral: Positive for hallucinations and substance abuse. The patient is nervous/anxious.     Blood pressure 135/85, pulse 65, temperature 97.7 F (36.5 C), temperature source Oral, resp. rate 16, height 6\' 1"  (1.854 m), weight 86.637 kg (191 lb).Body mass index is 25.2 kg/(m^2).  General Appearance: Fairly Groomed  Patent attorney::  Good  Speech:  Garbled and Pressured  Volume:  Increased  Mood:   Irritable  Affect:  Labile and Full Range  Thought Process:  Disorganized  Orientation:  Only to place and person  Thought Content:  Delusions and Hallucinations: Auditory  Suicidal Thoughts:  No  Homicidal Thoughts:  No  Memory:  Immediate;   Poor Recent;   Poor Remote;   Poor  Judgement:  Poor  Insight:  Lacking  Psychomotor Activity:  Increased  Concentration:  Poor  Recall:  Poor  Fund of Knowledge:Poor  Language: Fair  Akathisia:  No  Handed:  Right  AIMS (if indicated):     Assets:  Financial Resources/Insurance Housing  Sleep:  Number of Hours: 6   Musculoskeletal: Strength & Muscle Tone: within normal limits Gait & Station: normal Patient leans: N/A  Current Medications: Current Facility-Administered Medications  Medication Dose Route Frequency Provider Last Rate Last Dose  . acetaminophen (TYLENOL) tablet 650 mg  650 mg Oral Q6H PRN  Shuvon Rankin, NP      . alum & mag hydroxide-simeth (MAALOX/MYLANTA) 200-200-20 MG/5ML suspension 30 mL  30 mL Oral Q4H PRN Shuvon Rankin, NP      . divalproex (DEPAKOTE) DR tablet 750 mg  750 mg Oral BID PC Rocko Fesperman   750 mg at 04/03/13 0809  . fluPHENAZine (PROLIXIN) tablet 10 mg  10 mg Oral BID PC Cleotis Nipper, MD      .  gabapentin (NEURONTIN) capsule 600 mg  600 mg Oral BID Shuvon Rankin, NP   300 mg at 03/28/13 0754  . lactobacillus acidophilus & bulgar (LACTINEX) chewable tablet 1 tablet  1 tablet Oral TID WC Kerry Hough, PA-C   1 tablet at 03/23/13 1715  . lisinopril (PRINIVIL,ZESTRIL) tablet 20 mg  20 mg Oral Daily Jarmarcus Wambold      . magnesium hydroxide (MILK OF MAGNESIA) suspension 30 mL  30 mL Oral Daily PRN Shuvon Rankin, NP      . OLANZapine (ZYPREXA) tablet 15 mg  15 mg Oral BID Sabre Leonetti   7.5 mg at 03/28/13 0757  . traZODone (DESYREL) tablet 100 mg  100 mg Oral QHS Alexus Galka      . trihexyphenidyl (ARTANE) tablet 5 mg  5 mg Oral BID WC Sylvester Minton   5 mg at 04/03/13 9811    Lab Results:  Results for orders placed during the hospital encounter of 03/22/13 (from the past 48 hour(s))  VALPROIC ACID LEVEL     Status: None   Collection Time    04/03/13  6:18 AM      Result Value Ref Range   Valproic Acid Lvl 62.4  50.0 - 100.0 ug/mL   Comment: Performed at [facility]    Physical Findings: AIMS: Facial and Oral Movements Muscles of Facial Expression: None, normal Lips and Perioral Area: None, normal Jaw: None, normal Tongue: None, normal,Extremity Movements Upper (arms, wrists, hands, fingers): None, normal Lower (legs, knees, ankles, toes): None, normal, Trunk Movements Neck, shoulders, hips: None, normal, Overall Severity Severity of abnormal movements (highest score from questions above): None, normal Incapacitation due to abnormal movements: None, normal Patient's awareness of abnormal movements (rate only patient's report): No Awareness, Dental Status Current problems with teeth and/or dentures?: No Does patient usually wear dentures?: No  CIWA:  CIWA-Ar Total: 1 COWS:  COWS Total Score: 1  Treatment Plan Summary: Daily contact with patient to assess and evaluate symptoms and progress in treatment Medication management  Plan:1. Admit for crisis  management and stabilization. 2. Medication management to reduce current symptoms to base line and improve the  patient's overall level of functioning 3. Treat health problems as indicated. 4. Develop treatment plan to decrease risk of relapse upon discharge and the need for readmission. 5. Psycho-social education regarding relapse prevention and self care. 6. Health care follow up as needed for medical problems. 7. Continue Depakote 750mg  po BID for mood stabilization. Patient to continue other medication regimen. 8. Patient encouraged to take his medications. 9. Follow up lab report-Valproic acid level.  Medical Decision Making Problem Points:  Established problem, improving (1), Review of last therapy session (1) and Review of psycho-social stressors (1) Data Points:  Decision to obtain old records (1) Order Aims Assessment (2) Review and summation of old records (2) Review of medication regiment & side effects (2) Review of new medications or change in dosage (2)  I certify that inpatient services furnished can reasonably be expected to improve the patient's condition.  Thedore MinsAkintayo, Wal[place]ga Hudman, MD 04/03/2013, 9:57 AM

## 2013-04-03 NOTE — Progress Notes (Signed)
[facility] :  Will you be returning to the same living situation after discharge: Yes,  home At discharge, do you have transportation home?:Yes,  ACT team Do you have the ability to pay for your medications:Yes,  MCD  Release of information consent forms completed and in the chart;  Patient's signature needed at discharge.  Patient to Follow up at: Follow-up Information   Follow up with [facility] team. (Will pick you up day of d/c)    Contact information:   [address]  [place] [[phone]] [phone]      Patient denies SI/HI:   Yes,  yes    Safety Planning and Suicide Prevention discussed:  Yes,  yes  Gregory Meza, Gregory Meza 04/03/2013, 10:51 AM

## 2013-04-03 NOTE — Progress Notes (Signed)
Adult Psychoeducational Group Note  Date:  04/03/2013 Time:  10:28 PM  Group Topic/Focus:  Wrap-Up Group:   The focus of this group is to help patients review their daily goal of treatment and discuss progress on daily workbooks.  Participation Level:  Active  Participation Quality:  Redirectable and Sharing  Affect:  Labile  Cognitive:  Lacking  Insight: Limited  Engagement in Group:  Engaged and Improving  Modes of Intervention:  Support  Additional Comments:Patient attended and participated in group tonight. He reports having an OK day, just like any other day. For his wellness her plans to read the bible, exercise and go to the movies. He will also take his medication, but only two, Depakote and Artane.   Lita MainsFrancis, Alese Furniss [facility] 04/03/2013, 10:28 PM

## 2013-04-03 NOTE — [facility] Group Notes (Signed)
[facility]   04/03/2013 8:06 AM  Participation Quality:  Engaged  Mood/Affect:  Subdued  Depression Rating:  denies  Anxiety Rating:  denies  Thoughts of Suicide:  No Will you contract for safety?   NA  Current AVH:  No  Plan for Discharge/Comments:  Gregory Meza stated his plan is to leave today-he can get his brother to give him a ride.  I told him we would d/c him tomorrow and the ACT team would give him a ride.  He accepted this plan with no problem.  Transportation Means: see above  Supports: see above  Kiribatiorth, [name] Daubodney B

## 2013-04-03 NOTE — [facility] Group Notes (Signed)
[facility] LCSW Group Therapy  04/03/2013 1:15 pm  Type of Therapy: Process Group Therapy  Participation Level:  Active  Participation Quality:  Appropriate  Affect:  Flat  Cognitive:  Oriented  Insight:  Improving  Engagement in Group:  Limited  Engagement in Therapy:  Limited  Modes of Intervention:  Activity, Clarification, Education, Problem-solving and Support  Summary of Progress/Problems: Today's group addressed the issue of overcoming obstacles.  Patients were asked to identify their biggest obstacle post d/c that stands in the way of their on-going success, and then problem solve as to how to manage this.  Gregory Meza was in a good mood, and mellow.  He was assuming the wise consult role with a younger patient, and kept asking him to repeat what they had talked about before.  When asked about obstacles, he stated that he wants to make sure he is only d/ced on the depakote and artane, as everything else makes him too tired, and he needs to be sharp on the streets.  [name], [name] B 04/03/2013   2:23 PM

## 2013-04-03 NOTE — Tx Team (Signed)
  Interdisciplinary Treatment Plan Update   Date Reviewed:  04/03/2013  Time Reviewed:  8:06 AM  Progress in Treatment:   Attending groups: Yes Participating in groups: Yes Taking medication as prescribed: Yes  Tolerating medication: Yes Family/Significant other contact made: Yes  Patient understands diagnosis: Yes  Discussing patient identified problems/goals with staff: Yes Medical problems stabilized or resolved: Yes Denies suicidal/homicidal ideation: Yes Patient has not harmed self or others: Yes  For review of initial/current patient goals, please see plan of care.  Estimated Length of Stay:  D/C tomorrow  Reason for Continuation of Hospitalization:   New Problems/Goals identified:  N/A  Discharge Plan or Barriers:   return home, follow up outpt  Additional Comments:  Attendees:  Signature: Thedore MinsMojeed Akintayo, MD 04/03/2013 8:06 AM   Signature: Richelle Itood Adrain Nesbit, LCSW 04/03/2013 8:06 AM  Signature: Fransisca KaufmannLaura Davis, NP 04/03/2013 8:06 AM  Signature: Joslyn Devonaroline Beaudry, RN 04/03/2013 8:06 AM  Signature: Liborio NixonPatrice White, RN 04/03/2013 8:06 AM  Signature:  04/03/2013 8:06 AM  Signature:   04/03/2013 8:06 AM  Signature:    Signature:    Signature:    Signature:    Signature:    Signature:      Scribe for Treatment Team:   Richelle Itood Yong Wahlquist, LCSW  04/03/2013 8:06 AM

## 2013-04-03 NOTE — [facility] Suicide Risk Assessment (Signed)
[facility] INPATIENT:  Family/Significant Other Suicide Prevention Education  Suicide Prevention Education:  Education Completed; No one has been identified by the patient as the family member/significant other with whom the patient will be residing, and identified as the person(s) who will aid the patient in the event of a mental health crisis (suicidal ideations/suicide attempt).  With written consent from the patient, the family member/significant other has been provided the following suicide prevention education, prior to the and/or following the discharge of the patient.  The suicide prevention education provided includes the following:  Suicide risk factors  Suicide prevention and interventions  National Suicide Hotline telephone number  [facility] assessment telephone number  [facility] Emergency Assistance 911  [facility] and/or Residential Mobile Crisis Unit telephone number  Request made of family/significant other to:  Remove weapons (e.g., guns, rifles, knives), all items previously/currently identified as safety concern.    Remove drugs/medications (over-the-counter, prescriptions, illicit drugs), all items previously/currently identified as a safety concern.  The family member/significant other verbalizes understanding of the suicide prevention education information provided.  The family member/significant other agrees to remove the items of safety concern listed above. The patient did not endorse SI at the time of admission, nor did the patient c/o SI during the stay here.  SPE not required.   Ida Rogueorth, Oney Tatlock B 04/03/2013, 12:34 PM

## 2013-04-03 NOTE — Progress Notes (Signed)
D: Pt denies SI/HI/AVH. Pt appears to be responding to internal stimuli. Pt talking to himself and making inappropriate hand gestures. Pt refuses to take his meds and only takes Depakote and Artane in am. Pt refuses to have v/s taken. Pt continues to think that staff is trying to kill him with medications.  Pt compliant with attending groups. A: Medications offered to pt as ordered per MD. Verbal support given. Pt encouraged to take meds and attend groups. 15 minute checks performed for safety. R: Pt safety maintained.

## 2013-04-04 MED ORDER — TRIHEXYPHENIDYL HCL 5 MG PO TABS: 5.0000 mg | ORAL_TABLET | Freq: Two times a day (BID) | ORAL | Status: DC

## 2013-04-04 MED ORDER — TRAZODONE HCL 100 MG PO TABS: 100.0000 mg | ORAL_TABLET | Freq: Every day | ORAL | Status: DC

## 2013-04-04 MED ORDER — CETIRIZINE HCL 10 MG PO TABS: 10.0000 mg | ORAL_TABLET | Freq: Every day | ORAL | Status: DC

## 2013-04-04 MED ORDER — DIVALPROEX SODIUM 250 MG PO DR TAB: 750.0000 mg | DELAYED_RELEASE_TABLET | Freq: Two times a day (BID) | ORAL | Status: DC

## 2013-04-04 NOTE — Discharge Summary (Signed)
Physician Discharge Summary Note  Patient:  Gregory Meza is an 48 y.o., male MRN:  [id number] DOB:  [birth year] Patient phone:  [phone] (home)  Patient address:   [address] [place] [address],  Total Time spent with patient: 20 minutes  Date of Admission:  03/22/2013 Date of Discharge: 04/04/13  Reason for Admission:  Psychosis   Discharge Diagnoses: Principal Problem:   Schizoaffective disorder Active Problems:   Schizophrenia   Psychotic disorder   Cocaine dependence   Cannabis dependence   Psychiatric Specialty Exam: Physical Exam  Review of Systems  Constitutional: Negative.   HENT: Negative.   Eyes: Negative.   Respiratory: Negative.   Cardiovascular: Negative.   Gastrointestinal: Negative.   Genitourinary: Negative.   Musculoskeletal: Negative.   Skin: Negative.   Neurological: Negative.   Endo/Heme/Allergies: Negative.   Psychiatric/Behavioral: Negative for depression, suicidal ideas, hallucinations, memory loss and substance abuse. The patient is nervous/anxious. The patient does not have insomnia.     Blood pressure 135/85, pulse 65, temperature 97.7 F (36.5 C), temperature source Oral, resp. rate 16, height 6\' 1"  (1.854 m), weight 86.637 kg (191 lb).Body mass index is 25.2 kg/(m^2).  General Appearance: Fairly Groomed  Patent attorney::  Good  Speech:  Pressured  Volume:  Normal  Mood:  Euthymic  Affect:  Blunt  Thought Process:  Circumstantial  Orientation:  Full (Time, Place, and Person)  Thought Content:  Negative  Suicidal Thoughts:  No  Homicidal Thoughts:  No  Memory:  Immediate;   Fair Recent;   Fair Remote;   Fair  Judgement:  Poor  Insight:  Lacking  Psychomotor Activity:  Negative  Concentration:  Fair  Recall:  [employer] of Knowledge:Fair  Language: Fair  Akathisia:  No  Handed:  Right  AIMS (if indicated):     Assets:  Communication Skills Desire for Improvement  Sleep:  Number of Hours: 5.25    Past Psychiatric  History: Yes  Diagnosis: Schizophrenia   Hospitalizations: [facility] 2014   Outpatient Care: ACT team   Substance Abuse Care:   Self-Mutilation: Denies  Suicidal Attempts: Denies  Violent Behaviors: Denies    Musculoskeletal: Strength & Muscle Tone: within normal limits Gait & Station: normal Patient leans: N/A  DSM5: AXIS I: Schizoaffective disorder  Cocaine dependence  Cannabis dependence  AXIS II: Deferred  AXIS III:  Past Medical History   Diagnosis  Date   .  Hypertension    AXIS IV: other psychosocial or environmental problems and problems related to social environment  AXIS V: 61-70 mild symptoms   Level of Care:  OP  Hospital Course:  Gregory Meza is a 48 year old male who presented to [facility] under IVC, which was initiated by the patient's brother. The history has been limited by the acuity of the patient's condition. His speech has been difficult to understand due to being very pressured and garbled. Dina becomes very agitated when trying to explain what brought him to the hospital stating "My brother committed me. He is my Warden/ranger. The police reported me about some tax issues." The patient's chart was reviewed for pertinent information. Gregory Meza was extremely agitated with staff at [facility] as the notes indicate he was cursing and demanding that they get out of his room. The collateral information that was obtained indicates that when patientis off his medications that he exhibits paranoia and irrational behavior. He has been observed talking to himself while referring to demons. Patient shows no insight into his mental illness  with recent refusals of Zyprexa and Lisinopril. Gregory Meza has been observed talking to himself and making delusional comments to his peers. Patient may require a second opinion for forced medications if he continues to refuse his medications. Gregory Meza became angry today when asked why he was refusing his blood pressure medicine stating "I am healthy! I never eat  chitlins. Nothing is wrong with me!"   Patient was admitted to the 400 hallway for further medication management and assessment. Gregory Meza was easily agitated when staff encourage him to take his prescribed medications. He was insistent that he only needed to take Depakote and Artane. Patient made many delusional comments that his brother who is a Psychologist told him what medications to take. The patient continued to refuse his other medications that included Prolixin, Neurontin, Zyprexa, Trazodone, and Lisinopril. He was compliant with taking Depakote and Artane, with notable improvement in his mood. Patient was observed to be responding to internal stimuli making bizarre hand gestures during conversation with staff. He complained that psychotropic medications such as Zyprexa made him "too tired and I need to stay sharp to be on the streets." Gregory Meza continued to become upset when any staff member suggest that he take medications that he did not feel he needed. The patient did not qualify for forced medications because he did not demonstrate aggressive behavior on the unit. Gregory Meza was not found to be a danger to himself or anyone else but was pleasantly psychotic. Patient [facility]. He was provided with prescriptions for Depakote/Artane and a three day supply of sample medications.   Consults:  psychiatry  Significant Diagnostic Studies:  Admission labs reviewed and completed   Discharge Vitals:   Blood pressure 135/85, pulse 65, temperature 97.7 F (36.5 C), temperature source Oral, resp. rate 16, height 6\' 1"  (1.854 m), weight 86.637 kg (191 lb). Body mass index is 25.2 kg/(m^2). Lab Results:   Results for orders placed during the hospital encounter of 03/22/13 (from the past 72 hour(s))  VALPROIC ACID LEVEL     Status: None   Collection Time    04/03/13  6:18 AM      Result Value Ref Range   Valproic Acid Lvl 62.4  50.0 - 100.0 ug/mL    Comment: Performed at [facility]    Physical Findings: AIMS: Facial and Oral Movements Muscles of Facial Expression: None, normal Lips and Perioral Area: None, normal Jaw: None, normal Tongue: None, normal,Extremity Movements Upper (arms, wrists, hands, fingers): None, normal Lower (legs, knees, ankles, toes): None, normal, Trunk Movements Neck, shoulders, hips: None, normal, Overall Severity Severity of abnormal movements (highest score from questions above): None, normal Incapacitation due to abnormal movements: None, normal Patient's awareness of abnormal movements (rate only patient's report): No Awareness, Dental Status Current problems with teeth and/or dentures?: No Does patient usually wear dentures?: No  CIWA:  CIWA-Ar Total: 1 COWS:  COWS Total Score: 1  Psychiatric Specialty Exam: See Psychiatric Specialty Exam and Suicide Risk Assessment completed by Attending Physician prior to discharge.  Discharge destination:  Home  Is patient on multiple antipsychotic therapies at discharge:  No   Has Patient had three or more failed trials of antipsychotic monotherapy by history:  No  Recommended Plan for Multiple Antipsychotic Therapies: NA  Discharge Orders   Future Orders Complete By Expires   Discharge instructions  As directed    Comments:     Please follow up with your Primary Care Provider  for further management of medical problems. You were recommended to take Lisinopril during your admission for elevated blood pressure but refused the treatment.       Medication List       Indication   cetirizine 10 MG tablet  Commonly known as:  ZYRTEC  Take 1 tablet (10 mg total) by mouth daily.   Indication:  Perennial Rhinitis     divalproex 250 MG DR tablet  Commonly known as:  DEPAKOTE  Take 3 tablets (750 mg total) by mouth 2 (two) times daily after a meal. For improved mood stability.   Indication:  Rapidly Alternating Manic-Depressive Psychosis      traZODone 100 MG tablet  Commonly known as:  DESYREL  Take 1 tablet (100 mg total) by mouth at bedtime.   Indication:  Aggressive Behavior, Trouble Sleeping     trihexyphenidyl 5 MG tablet  Commonly known as:  ARTANE  Take 1 tablet (5 mg total) by mouth 2 (two) times daily with a meal.   Indication:  Extrapyramidal Reaction caused by Medications           Follow-up Information   Follow up with [facility] team. (Will pick you up day of d/c)    Contact information:   [address]  [place] [phone]      Follow-up recommendations:   Activity: as tolerated  Diet: healthy  Tests: Valprioc acid level: 62.4  Other: patient to keep his after care appointment with his ACT Team   Comments:   Take all your medications as prescribed by your mental healthcare provider.  Report any adverse effects and or reactions from your medicines to your outpatient provider promptly.  Patient is instructed and cautioned to not engage in alcohol and or illegal drug use while on prescription medicines.  In the event of worsening symptoms, patient is instructed to call the crisis hotline, 911 and or go to the nearest ED for appropriate evaluation and treatment of symptoms.  Follow-up with your primary care provider for your other medical issues, concerns and or health care needs.   Total Discharge Time:  Greater than 30 minutes.  SignedFransisca Kaufmann NP-C 04/04/2013, 9:42 AM  Patient seen, evaluated and I agree with notes by Nurse Practitioner. Thedore Mins, MD

## 2013-04-04 NOTE — [facility] Suicide Risk Assessment (Signed)
   Demographic Factors:  Male, Low socioeconomic status and Unemployed  Total Time spent with patient: 20 minutes  Psychiatric Specialty Exam: Physical Exam  Psychiatric: He has a normal mood and affect. His behavior is normal. Thought content normal. His speech is rapid and/or pressured. Cognition and memory are normal. He expresses impulsivity.    Review of Systems  Constitutional: Negative.   HENT: Negative.   Eyes: Negative.   Respiratory: Negative.   Cardiovascular: Negative.   Gastrointestinal: Negative.   Genitourinary: Negative.   Musculoskeletal: Negative.   Skin: Negative.   Neurological: Negative.   Endo/Heme/Allergies: Negative.   Psychiatric/Behavioral: Negative.     Blood pressure 135/85, pulse 65, temperature 97.7 F (36.5 C), temperature source Oral, resp. rate 16, height 6\' 1"  (1.854 m), weight 86.637 kg (191 lb).Body mass index is 25.2 kg/(m^2).  General Appearance: Fairly Groomed  Patent attorneyye Contact::  Good  Speech:  Pressured  Volume:  Normal  Mood:  Euthymic  Affect:  Blunt  Thought Process:  Circumstantial  Orientation:  Full (Time, Place, and Person)  Thought Content:  Negative  Suicidal Thoughts:  No  Homicidal Thoughts:  No  Memory:  Immediate;   Fair Recent;   Fair Remote;   Fair  Judgement:  marginal  Insight:  marginal  Psychomotor Activity:  Negative  Concentration:  Fair  Recall:  FiservFair  Fund of Knowledge:Fair  Language: Fair  Akathisia:  No  Handed:  Right  AIMS (if indicated):     Assets:  Communication Skills Desire for Improvement  Sleep:  Number of Hours: 5.25    Musculoskeletal: Strength & Muscle Tone: within normal limits Gait & Station: normal Patient leans: N/A   Mental Status Per Nursing Assessment::   On Admission:  NA  Current Mental Status by Physician: patient denies suicidal ideation, intent or plan  Loss Factors: Financial problems/change in socioeconomic status  Historical Factors: Impulsivity  Risk  Reduction Factors:   Sense of responsibility to family and Living with another person, especially a relative  Continued Clinical Symptoms:  Resolving mood, delusion and psychosis  Cognitive Features That Contribute To Risk:  Closed-mindedness    Suicide Risk:  Minimal: No identifiable suicidal ideation.  Patients presenting with no risk factors but with morbid ruminations; may be classified as minimal risk based on the severity of the depressive symptoms  Discharge Diagnoses:   AXIS I:  Schizoaffective disorder              Cocaine dependence              Cannabis dependence AXIS II:  Deferred AXIS III:   Past Medical History  Diagnosis Date  . Hypertension    AXIS IV:  other psychosocial or environmental problems and problems related to social environment AXIS V:  61-70 mild symptoms  Plan Of Care/Follow-up recommendations:  Activity:  as tolerated Diet:  healthy Tests:  Valprioc acid level: 62.4 Other:  patient to keep his after care appointment with his ACT Team  Is patient on multiple antipsychotic therapies at discharge:  No   Has Patient had three or more failed trials of antipsychotic monotherapy by history:  No  Recommended Plan for Multiple Antipsychotic Therapies: NA    Thedore MinsAkintayo, Avo Schlachter, MD 04/04/2013, 9:58 AM

## 2013-04-04 NOTE — Progress Notes (Signed)
D Pt. Denies SI and HI, no complaints of pain or discomfort noted  A Writer offered support and encouragement.    R  Pt. Remains safe on the unit.  Pt. Refuses all meds except depakote, and artane. MD aware.  Pt. Is scheduled to DC today if weather permits ACT team to escort pt.

## 2013-04-04 NOTE — Progress Notes (Signed)
Adult Psychoeducational Group Note  Date:  04/04/2013 Time:  6:42 PM  Group Topic/Focus:  Therapeutic activity  Participation Level:  Active  Participation Quality:  Appropriate and Attentive  Affect:  Appropriate  Cognitive:  Alert and Appropriate  Insight: Appropriate and Good  Engagement in Group:  Engaged  Modes of Intervention:  Support  Additional Comments:  Pt participated in group.  Marquis Lunchbrahim, Afshin Chrystal 04/04/2013, 6:42 PM

## 2013-04-04 NOTE — Progress Notes (Signed)
Pt. Denies SI and HI.  Pt. Was escorted to his locker to pick up his belongings, then to his Brother who was waiting to take him home.  Pt. Was calm and cooperative and ready for his discharge.

## 2013-04-07 NOTE — Progress Notes (Signed)
Patient Discharge Instructions:  After Visit Summary (AVS):   Faxed to:  04/07/13 Discharge Summary Note:   Faxed to:  04/07/13 Psychiatric Admission Assessment Note:   Faxed to:  04/07/13 Suicide Risk Assessment - Discharge Assessment:   Faxed to:  04/07/13 Faxed/Sent to the Next Level Care provider:  04/07/13 Faxed to [facility] @ [phone]  Jerelene ReddenSheena E [place], 04/07/2013, 4:20 PM
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
## 2014-09-10 ENCOUNTER — Emergency Department (HOSPITAL_COMMUNITY)
Admission: EM | Admit: 2014-09-10 | Discharge: 2014-09-11 | Disposition: A | Discharge status: Other Acute Inpatient Hospital | Payer: Medicaid Other | Attending: Emergency Medicine | Admitting: Emergency Medicine

## 2014-09-10 ENCOUNTER — Encounter (HOSPITAL_COMMUNITY): Payer: Self-pay

## 2014-09-10 DIAGNOSIS — F22 Delusional disorders: Secondary | ICD-10-CM | POA: Diagnosis present

## 2014-09-10 DIAGNOSIS — Z79899 Other long term (current) drug therapy: Secondary | ICD-10-CM | POA: Diagnosis not present

## 2014-09-10 DIAGNOSIS — Z720 Tobacco use: Secondary | ICD-10-CM | POA: Insufficient documentation

## 2014-09-10 DIAGNOSIS — F1410 Cocaine abuse, uncomplicated: Secondary | ICD-10-CM | POA: Diagnosis not present

## 2014-09-10 DIAGNOSIS — F29 Unspecified psychosis not due to a substance or known physiological condition: Secondary | ICD-10-CM | POA: Insufficient documentation

## 2014-09-10 LAB — RAPID URINE DRUG SCREEN, HOSP PERFORMED
AMPHETAMINES: NOT DETECTED
Barbiturates: NOT DETECTED
Benzodiazepines: NOT DETECTED
Cocaine: POSITIVE — AB
OPIATES: NOT DETECTED
Tetrahydrocannabinol: NOT DETECTED

## 2014-09-10 LAB — COMPREHENSIVE METABOLIC PANEL
ALBUMIN: 4.5 g/dL (ref 3.5–5.0)
ALK PHOS: 71 U/L (ref 38–126)
ALT: 25 U/L (ref 17–63)
AST: 28 U/L (ref 15–41)
Anion gap: 7 (ref 5–15)
BUN: 12 mg/dL (ref 6–20)
CO2: 28 mmol/L (ref 22–32)
Calcium: 9.6 mg/dL (ref 8.9–10.3)
Chloride: 106 mmol/L (ref 101–111)
Creatinine, Ser: 0.89 mg/dL (ref 0.61–1.24)
Glucose, Bld: 107 mg/dL — ABNORMAL HIGH (ref 65–99)
POTASSIUM: 3.5 mmol/L (ref 3.5–5.1)
Sodium: 141 mmol/L (ref 135–145)
Total Bilirubin: 0.4 mg/dL (ref 0.3–1.2)
Total Protein: 8.2 g/dL — ABNORMAL HIGH (ref 6.5–8.1)

## 2014-09-10 LAB — CBC
HCT: 43.7 % (ref 39.0–52.0)
HEMOGLOBIN: 14.9 g/dL (ref 13.0–17.0)
MCH: 30.2 pg (ref 26.0–34.0)
MCHC: 34.1 g/dL (ref 30.0–36.0)
MCV: 88.5 fL (ref 78.0–100.0)
Platelets: 335 10*3/uL (ref 150–400)
RBC: 4.94 MIL/uL (ref 4.22–5.81)
RDW: 14.2 % (ref 11.5–15.5)
WBC: 11.2 10*3/uL — ABNORMAL HIGH (ref 4.0–10.5)

## 2014-09-10 LAB — ETHANOL

## 2014-09-10 MED ORDER — ONDANSETRON HCL 4 MG PO TABS: 4.0000 mg | ORAL_TABLET | Freq: Three times a day (TID) | ORAL | Status: DC | PRN

## 2014-09-10 MED ORDER — IBUPROFEN 200 MG PO TABS: 600.0000 mg | ORAL_TABLET | Freq: Three times a day (TID) | ORAL | Status: DC | PRN

## 2014-09-10 MED ORDER — NICOTINE 21 MG/24HR TD PT24: 21.0000 mg | MEDICATED_PATCH | Freq: Every day | TRANSDERMAL | Status: DC | PRN

## 2014-09-10 MED ORDER — ACETAMINOPHEN 325 MG PO TABS: 650.0000 mg | ORAL_TABLET | ORAL | Status: DC | PRN

## 2014-09-10 MED ORDER — ZOLPIDEM TARTRATE 5 MG PO TABS: 5.0000 mg | ORAL_TABLET | Freq: Every evening | ORAL | Status: DC | PRN

## 2014-09-10 MED ORDER — ALUM & MAG HYDROXIDE-SIMETH 200-200-20 MG/5ML PO SUSP: 30.0000 mL | ORAL | Status: DC | PRN

## 2014-09-10 NOTE — ED Provider Notes (Signed)
CSN: [id number]     Arrival date & time 09/10/14  1754 History   First MD Initiated Contact with Patient 09/10/14 1926     Chief Complaint  Patient presents with  . Delusional     (Consider location/radiation/quality/duration/timing/severity/associated sxs/prior Treatment) The history is provided by the patient.   Gregory Meza is a 49 y.o. male who states that he is here because he has been "wrestling with the devil and demons". He states that he walked here. He was at his day program today and talk to his therapist about coming here for treatment. He denies suicidal or homicidal ideation. He spontaneously states that he has been "delusional". He describes that his "hand is a certificate that tells him what to do". He states that if people don't do what he wants to do, "somebody will get hurt."  He is unable to specify anything further  Level V Caveat- altered mental status     Past Medical History  Diagnosis Date  . Schizophrenia    History reviewed. No pertinent past surgical history. History reviewed. No pertinent family history. History  Substance Use Topics  . Smoking status: Current Every Day Smoker -- 1.50 packs/day for 3 years    Types: Cigarettes  . Smokeless tobacco: Not on file  . Alcohol Use: 1.2 oz/week    1 Glasses of wine, 1 Cans of beer per week    Review of Systems  Unable to perform ROS     Allergies  Haldol and Risperidone and related  Home Medications   Prior to Admission medications   Medication Sig Start Date End Date Taking? Authorizing Provider  cetirizine (ZYRTEC) 10 MG tablet Take 1 tablet (10 mg total) by mouth daily. Patient not taking: Reported on 09/10/2014 04/04/13   Thermon Leyland, NP  divalproex (DEPAKOTE) 250 MG DR tablet Take 3 tablets (750 mg total) by mouth 2 (two) times daily after a meal. For improved mood stability. 04/04/13   Thermon Leyland, NP  traZODone (DESYREL) 100 MG tablet Take 1 tablet (100 mg total) by mouth at bedtime.  04/04/13   Thermon Leyland, NP  trihexyphenidyl (ARTANE) 5 MG tablet Take 1 tablet (5 mg total) by mouth 2 (two) times daily with a meal. 04/04/13   Thermon Leyland, NP   BP 133/89 mmHg  Pulse 98  Temp(Src) 98.7 F (37.1 C) (Oral)  Resp 18  SpO2 98% Physical Exam  Constitutional: He appears well-developed and well-nourished.  HENT:  Head: Normocephalic and atraumatic.  Right Ear: External ear normal.  Left Ear: External ear normal.  Eyes: Conjunctivae and EOM are normal. Pupils are equal, round, and reactive to light.  Neck: Normal range of motion and phonation normal. Neck supple.  Cardiovascular: Normal rate, regular rhythm and normal heart sounds.   Pulmonary/Chest: Effort normal and breath sounds normal. He exhibits no bony tenderness.  Abdominal: Soft. There is no tenderness.  Musculoskeletal: Normal range of motion.  Neurological: He is alert. No cranial nerve deficit or sensory deficit. He exhibits normal muscle tone. Coordination normal.  Skin: Skin is warm, dry and intact.  Psychiatric:  Delusional. Flight of ideas. Paranoid.  Nursing note and vitals reviewed.   ED Course  Procedures (including critical care time)  Involuntary commitment and First Opinion paperwork filled out by me.  Medications  acetaminophen (TYLENOL) tablet 650 mg (not administered)  ibuprofen (ADVIL,MOTRIN) tablet 600 mg (not administered)  zolpidem (AMBIEN) tablet 5 mg (not administered)  nicotine (NICODERM CQ - dosed  in mg/24 hours) patch 21 mg (not administered)  ondansetron (ZOFRAN) tablet 4 mg (not administered)  alum & mag hydroxide-simeth (MAALOX/MYLANTA) 200-200-20 MG/5ML suspension 30 mL (not administered)    Patient Vitals for the past 24 hrs:  BP Temp Temp src Pulse Resp SpO2  09/10/14 1802 133/89 mmHg 98.7 F (37.1 C) Oral 98 18 98 %   Patient is medically cleared for treatment by psychiatry services  Discussed with TTS who agrees that patient needs to be admitted for further  treatment.  Labs Review Labs Reviewed  COMPREHENSIVE METABOLIC PANEL - Abnormal; Notable for the following:    Glucose, Bld 107 (*)    Total Protein 8.2 (*)    All other components within normal limits  CBC - Abnormal; Notable for the following:    WBC 11.2 (*)    All other components within normal limits  ETHANOL  URINE RAPID DRUG SCREEN, HOSP PERFORMED    Imaging Review No results found.   EKG Interpretation None      MDM   Final diagnoses:  Psychosis, unspecified psychosis type    Psychosis, patient is unstable. He will require admission to a psychiatric service.  Nursing Notes Reviewed/ Care Coordinated, and agree without changes. Applicable Imaging Reviewed.  Interpretation of Laboratory Data incorporated into ED treatment  Plan: Admit to psychiatry service.  Mancel Bale, MD 09/11/14 [phone]

## 2014-09-10 NOTE — BH Assessment (Signed)
Inpt recommended, no current 500 hall beds available at [facility]. TTS seeking placement. Sent referrals to: Leonette Most, [place]    Clista Bernhardt, [facility] Triage Specialist 09/10/2014 8:42 PM

## 2014-09-10 NOTE — ED Notes (Signed)
Wanded by security 

## 2014-09-10 NOTE — ED Notes (Signed)
Pt, brought in voluntarily by [employer], c/o being delusional and "wrestiling with the devil and demons" x 4 days.  Hx of schizophrenia and reports being compliant w/ medications.  Pt reports that he was at a day program and he promised someone there that he would come talk to someone.  Denies SI/HI.  Sts "I'd never commit suicide, I love myself."  Pt's initial statement was "I've been delusional," but then, goes on the speak as if delusions are real.
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
## 2014-09-10 NOTE — BH Assessment (Signed)
Reviewed ED notes prior to initiating assessment. Pt with hx of schizophrenia reports delusions "wrestling with the devil" for the past four days. He reports he is medication compliant. Pt denies SI and HI. Pt has hx of [facility] admissions in 03/2013 and 03/2012 for schizophrenia.   Requested cart be placed with pt for assessment.   Assessment to commence shortly.

## 2014-09-11 ENCOUNTER — Encounter (HOSPITAL_COMMUNITY): Payer: Self-pay

## 2014-09-11 ENCOUNTER — Inpatient Hospital Stay (HOSPITAL_COMMUNITY)
Admission: AD | Admit: 2014-09-11 | Discharge: 2014-09-25 | DRG: 885 | Disposition: A | Discharge status: Home / Self Care | Payer: Medicaid Other | Source: Intra-hospital | Attending: Psychiatry | Admitting: Psychiatry

## 2014-09-11 DIAGNOSIS — F29 Unspecified psychosis not due to a substance or known physiological condition: Secondary | ICD-10-CM | POA: Diagnosis not present

## 2014-09-11 DIAGNOSIS — F1220 Cannabis dependence, uncomplicated: Secondary | ICD-10-CM | POA: Diagnosis present

## 2014-09-11 DIAGNOSIS — F203 Undifferentiated schizophrenia: Secondary | ICD-10-CM | POA: Diagnosis present

## 2014-09-11 DIAGNOSIS — Z79899 Other long term (current) drug therapy: Secondary | ICD-10-CM

## 2014-09-11 DIAGNOSIS — F250 Schizoaffective disorder, bipolar type: Secondary | ICD-10-CM | POA: Diagnosis present

## 2014-09-11 DIAGNOSIS — F17210 Nicotine dependence, cigarettes, uncomplicated: Secondary | ICD-10-CM | POA: Diagnosis present

## 2014-09-11 DIAGNOSIS — F1420 Cocaine dependence, uncomplicated: Secondary | ICD-10-CM | POA: Diagnosis present

## 2014-09-11 MED ORDER — DIVALPROEX SODIUM 500 MG PO DR TAB
750.0000 mg | DELAYED_RELEASE_TABLET | Freq: Two times a day (BID) | ORAL | Status: DC
  Filled 2014-09-11 (×2): qty 1

## 2014-09-11 MED ORDER — TRAZODONE HCL 100 MG PO TABS
100.0000 mg | ORAL_TABLET | Freq: Every day | ORAL | Status: DC
  Administered 2014-09-14 – 2014-09-24 (×11): 100 mg via ORAL
  Filled 2014-09-11 (×10): qty 1
  Filled 2014-09-11: qty 3
  Filled 2014-09-11 (×5): qty 1

## 2014-09-11 MED ORDER — TRIHEXYPHENIDYL HCL 5 MG PO TABS
5.0000 mg | ORAL_TABLET | Freq: Two times a day (BID) | ORAL | Status: DC
  Administered 2014-09-11 – 2014-09-12 (×2): 5 mg via ORAL
  Filled 2014-09-11 (×7): qty 1

## 2014-09-11 MED ORDER — DIVALPROEX SODIUM 500 MG PO DR TAB
750.0000 mg | DELAYED_RELEASE_TABLET | Freq: Two times a day (BID) | ORAL | Status: DC
  Administered 2014-09-11: 750 mg via ORAL
  Filled 2014-09-11 (×10): qty 1

## 2014-09-11 MED ORDER — TRAZODONE HCL 100 MG PO TABS: 100.0000 mg | ORAL_TABLET | Freq: Every day | ORAL | Status: DC

## 2014-09-11 MED ORDER — ARIPIPRAZOLE 5 MG PO TABS
5.0000 mg | ORAL_TABLET | Freq: Two times a day (BID) | ORAL | Status: DC
  Filled 2014-09-11 (×2): qty 1

## 2014-09-11 MED ORDER — TRIHEXYPHENIDYL HCL 5 MG PO TABS
5.0000 mg | ORAL_TABLET | Freq: Two times a day (BID) | ORAL | Status: DC
  Administered 2014-09-11: 5 mg via ORAL
  Filled 2014-09-11 (×3): qty 1

## 2014-09-11 MED ORDER — ARIPIPRAZOLE 5 MG PO TABS
5.0000 mg | ORAL_TABLET | Freq: Two times a day (BID) | ORAL | Status: DC
  Administered 2014-09-11: 5 mg via ORAL
  Filled 2014-09-11 (×9): qty 1

## 2014-09-11 NOTE — Progress Notes (Signed)
Adult Psychoeducational Group Note  Date:  09/11/2014 Time:  11:40 PM  Group Topic/Focus:  Wrap-Up Group:   The focus of this group is to help patients review their daily goal of treatment and discuss progress on daily workbooks.  Participation Level:  Active  Participation Quality:  Attentive and Redirectable  Affect:  Anxious  Cognitive:  Alert  Insight: Appropriate  Engagement in Group:  Engaged  Modes of Intervention:  Discussion and Education  Additional Comments:  Pt rated his day at a 2 out of 10 because he felt "drained out". Pt stated he had a confrontation with someone when coming back to the unit after a meal that upset him. 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  Malachy Moan 09/11/2014, 11:40 PM

## 2014-09-11 NOTE — ED Notes (Signed)
Patient refused vital signs at this time. Attending RN notified.

## 2014-09-11 NOTE — BH Assessment (Signed)
BHH Assessment Progress Note  Per Thedore Mins, MD, this pt requires psychiatric hospitalization at this time.  Berneice Heinrich, RN, [facility] has assigned pt to [facility] Rm [id number].  Pt has signed Consent to Release Information to his brother, Maurice March, and the signed document has been faxed to [facility] along with IVC documents.  Pt's nurse, Lincoln Maxin, has been notified.  She agrees to send signed document to [facility] along with pt via [employer], and to call report to [phone].   Doylene Canning, MA Triage Specialist [phone]

## 2014-09-11 NOTE — ED Notes (Signed)
Patient denies SI,, HI and AVH at this time. Patient does admits to depression and irritability. Plan of care discussed with patient. Encouragement and support provided and safety maintain. Q 15 min safety checks remain place.

## 2014-09-11 NOTE — Tx Team (Signed)
Initial Interdisciplinary Treatment Plan   PATIENT STRESSORS: Financial difficulties   PATIENT STRENGTHS: Ability for insight   PROBLEM LIST: Problem List/Patient Goals Date to be addressed Date deferred Reason deferred Estimated date of resolution  Anxiety 09/11/2014     "My medication causes side effects." 09/11/2014     "get out as fast as possible."  09/11/2014     Communication Skills 09/11/2014                                    DISCHARGE CRITERIA:  Ability to meet basic life and health needs Adequate post-discharge living arrangements Motivation to continue treatment in a less acute level of care  PRELIMINARY DISCHARGE PLAN: Outpatient therapy  PATIENT/FAMIILY INVOLVEMENT: This treatment plan has been presented to and reviewed with the patient, Gregory, Meza 09/11/2014, 6:12 PM

## 2014-09-11 NOTE — Progress Notes (Signed)
D   Approached pt while he was in his room laying in the bed   He endorses some A/V hallucinations and some depression   He keeps to himself and has not socialized with anyone   He is calm and cooperative   He refused his sleeping pill and said he would be able to get to sleep without it  A   Verbal support given   Medications offered and education provided on same   Q 15 min checks R   Pt safe at present and verbalized understanding

## 2014-09-11 NOTE — [facility] Counselor (Signed)
Adult Comprehensive Assessment  Patient ID: Gregory Meza, male DOB: 10/23/1965, 49 y.o. 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, uses pros and cons Substance abuse: Denies Bereavement / Denies  Living/Environment/Situation:  Living Arrangements: Has his own apartment Living conditions (as described by patient or guardian): "Middle class" How long has patient lived in current situation?: Since June 1 What is atmosphere in current home: Loving  Family History:  Marital status: Single Does patient have children?: Yes How many children?: 1 How is patient's relationship with their children?: Fair  Childhood History:  By whom was/is the patient raised?: Mother;Father Description of patient's relationship with caregiver when they were a child: "Very respectful, humble. Had family morals and were not ungrateful to parents." Patient's description of current relationship with people who raised him/her: "Real close and loving." Does patient have siblings?: Yes Number of Siblings: 4 Description of patient's current relationship with siblings: "Good" Did patient suffer any verbal/emotional/physical/sexual abuse as a child?: No Did patient suffer from severe childhood neglect?: No Has patient ever been sexually abused/assaulted/raped as an adolescent or adult?: No Was the patient ever a victim of a crime or a disaster?: No Witnessed domestic violence?: Yes Has patient been effected by domestic violence as an adult?: No Description of domestic violence: "1979 civil rights movement"  Education:   Highest grade of school patient has completed: 10th, then GED, then some college classes Currently a Consulting civil engineer?: No Learning disability?: Yes What learning problems does patient have?: Cannot understand his answer  Employment/Work Situation:  Employment situation: On disability Where is patient currently employed?: "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." How long has patient been employed?: "All my life." Why is patient on disability: Mental Health How long has patient been on disability: Since age 42 Patient's job has been impacted by current illness: No What is the longest time patient has a held a job?: 2-3 years 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 Pt denies, but last time he was here was positive for cannabis and cocaine. Would not If attempted suicide, did drugs/alcohol play a role in this?: No Alcohol/Substance Abuse Treatment Hx: Denies past history Has alcohol/substance abuse ever caused legal problems?: No  Social Support System:  Patient's Community Support System: Good Describe Community Support System: Gregory Meza, brother Gregory Meza, another friend Gregory Meza, father Gregory Meza, sister Gregory Meza, stepmother Gregory Meza, sister Gregory Meza, brother-in-law Type of faith/religion: [name]'s Witness How does patient's faith help to cope with current illness?: Does not use  Leisure/Recreation:  Leisure and Hobbies:  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  Strengths/Needs:  What things does the patient do well?: "Anything I put my mind to." In what areas does  patient struggle / problems for patient: "I guess I would say nothing. Nothing since I learned how to read and write. I'm a genius, I can do math now even though it was hard."  Discharge Plan:  Does patient have access to transportation?: Yes  Will patient be returning to same living situation after discharge?: Yes Currently receiving community mental health services:[facility] Does patient have financial barriers related to discharge medications?: No  Summary/Recommendations:  Summary and Recommendations (to be completed by the evaluator):  Gregory Meza presents as pleasantly psychotic.  He is disorganized and delusional, as well as being a very poor historian.  Fortunately, he has many services following him.The following info is courtesy of Gregory Meza, QP at [facility] at [phone] and DOJ worker Gregory Meza at [phone]. [name] has been with [facility] on and off for about 8 years. He spent most of 2013 in [facility]. After getting out, he was mostly compliant with the ACT team for 4-5 months. He then began refusing services and ended up in jail for 3 months, where he decompensated. He again refused ACT services when he got out.  After his hospitalization with [place] in Feb of 15., he again decompensated to the extent that he ended up back in [facility] towards the end of the year.  While there, he was found incompetent to handle his won affairs and assigned a guardian, Gregory Meza  [phone]. From [facility] he went to [facility] in [place], arriving there in February of 16.  He was stable and did well there, and by May was pushing to be able to live independently.  He was allowed to get his own place in June.  He was following up with [facility] for [facility], going to Dr Gregory Meza for medication management and seeing Gregory Meza  through [facility] [phone].  He fairly quickly decompenated over the past 6 weeks, probably through a combination of medication non-compliance and substance abuse, to the point that he required hospitalization and is back with [place].  He appears to do much better whn he is in a structured environment, obviously.He can benefit from crises stabilization, medication management, therapeutic milieu and coordination with outpt providers.

## 2014-09-11 NOTE — Progress Notes (Addendum)
Admission note: Patient IVC from [facility]. "I had some personal problems, delusional wrestling with the devil, the doctors knows my story." Patient guarded on approach.Poor eye contact during admission process. Increased anxiety observed, tapping feet, restless. He denies SI/HI. Speech pressured and rapid. Patient reports he is "seeing things that are not there", but denies auditory hallucinations. Patient reports he is sleeping good and eating good. "My medicine causes side effects, but I take it everyday." He reports he only drinks a beer "every 6 months. Denies drug use- he reports he tried marijuana 2-3 months ago from a friend for his first time and would never do it again. Reports he is a current smoker and expresses no desire to stop smoking "I love cigarettes." He reports his goal for hospitalization is "get out as fast as possible." Special checks q 15 mins intiated for safety. Patient oriented to unit and introduced to roommate. Will continue to monitor.

## 2014-09-12 ENCOUNTER — Encounter (HOSPITAL_COMMUNITY): Payer: Self-pay | Admitting: Psychiatry

## 2014-09-12 DIAGNOSIS — F250 Schizoaffective disorder, bipolar type: Secondary | ICD-10-CM | POA: Diagnosis present

## 2014-09-12 DIAGNOSIS — F203 Undifferentiated schizophrenia: Secondary | ICD-10-CM

## 2014-09-12 MED ORDER — TRIHEXYPHENIDYL HCL 2 MG PO TABS
2.0000 mg | ORAL_TABLET | Freq: Two times a day (BID) | ORAL | Status: DC
  Administered 2014-09-13 – 2014-09-24 (×17): 2 mg via ORAL
  Filled 2014-09-12 (×25): qty 1

## 2014-09-12 MED ORDER — LORAZEPAM 1 MG PO TABS
1.0000 mg | ORAL_TABLET | Freq: Three times a day (TID) | ORAL | Status: DC | PRN
  Administered 2014-09-14 – 2014-09-25 (×18): 1 mg via ORAL
  Filled 2014-09-12 (×6): qty 1
  Filled 2014-09-12: qty 2
  Filled 2014-09-12 (×15): qty 1

## 2014-09-12 NOTE — Discharge Summary (Deleted)
Physician Discharge Summary Note  Patient:  Gregory Meza is an 49 y.o., male MRN:  [id number] DOB:  Feb 26, 1965 Patient phone:  [phone] (home)  Patient address:   [address] [place] [address],  Total Time spent with patient: 30 minutes  Date of Admission:  09/11/2014 Date of Discharge: 09/12/2014  Reason for Admission:  Audio and visual hallucinations  Principal Problem: Schizophrenia, acute undifferentiated Discharge Diagnoses: Patient Active Problem List   Diagnosis Date Noted  . 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 Schizoaffective disorder [F25.9] 03/24/2013  . Cocaine dependence [F14.20] 03/23/2013  . Cannabis dependence [F12.20] 03/23/2013  . Psychotic disorder [F29] 03/22/2013  . Schizophrenia [F20.9] 03/29/2012    Class: Chronic  . Substance abuse [F19.10] 03/29/2012    Class: Chronic  . Non-compliant patient [Z91.19] 03/29/2012    Class: Chronic    Musculoskeletal: Strength & Muscle Tone: within normal limits Gait & Station: normal Patient leans: N/A  Psychiatric Specialty Exam: Physical Exam  Vitals reviewed.   ROS  Blood pressure 110/77, pulse 82, temperature 99 F (37.2 C), temperature source Oral, resp. rate 18, height _0  (1.803 m), weight 109.317 kg (241 lb).Body mass index is 33.63 kg/(m^2).  General Appearance: Bizarre  Eye Contact::  Good  Speech:  Blocked, Garbled and Pressured  Volume:  Normal  Mood:  Anxious and Irritable  Affect:  Non-Congruent, Labile and Full Range  Thought Process:  Disorganized, Irrelevant, Loose and Tangential  Orientation:  Full (Time, Place, and Person)  Thought Content:  Hallucinations: Auditory  Suicidal Thoughts:  No  Homicidal Thoughts:  No  Memory:  Immediate;   Poor Recent;   Poor Remote;   Poor  Judgement:  Poor  Insight:  Lacking  Psychomotor Activity:  fidgety, hand motions  Concentration:  Poor  Recall:  [place]  Language: Fair  Akathisia:   Negative  Handed:  Right  AIMS (if indicated):     Assets:  Physical Health  ADL's:  Intact  Cognition: Impaired,  Moderate  Sleep:  Number of Hours: 3.5   Have you used any form of tobacco in the last 30 days? (Cigarettes, Smokeless Tobacco, Cigars, and/or Pipes): Yes  Has this patient used any form of tobacco in the last 30 days? (Cigarettes, Smokeless Tobacco, Cigars, and/or Pipes) N/A  Past Medical History:  Past Medical History  Diagnosis Date  . Schizophrenia    History reviewed. No pertinent past surgical history. Family History: History reviewed. No pertinent family history. Social History:  History  Alcohol Use  . 1.2 oz/week  . 1 Glasses of wine, 1 Cans of beer per week     History  Drug Use Not on file    History   Social History  . Marital Status: Single    Spouse Name: N/A  . Number of Children: N/A  . Years of Education: N/A   Social History Main Topics  . Smoking status: Current Every Day Smoker -- 1.50 packs/day for 3 years    Types: Cigarettes  . Smokeless tobacco: Not on file  . Alcohol Use: 1.2 oz/week    1 Glasses of wine, 1 Cans of beer per week  . Drug Use: Not on file  . Sexual Activity: Not Currently   Other Topics Concern  . None   Social History Narrative   Risk to Self: Is patient at risk for suicide?: No Risk to Others:   Prior Inpatient Therapy:   Prior Outpatient Therapy:    Level  of Care:  OP  Hospital Course: Gregory Meza is an 49 y.o. male initially presented to ED at the suggestion of his case manager Ms. Caryl Asp.  Per tele report, patient was having AVH.  Pt acknowledges he has AVH, then shortly after denies AVH and reports he is really experiencing battles with demons and the devil. Pt appears to be a poor historian and appears to be influenced by delusions at present.  Pt denies use of alcohol or drugs.  UDS shows positive for cocaine.   Pt denies family hx of MH, SA, or SI concerns.   Patient was seen today.  Patient  states, "snakes goon and goblins.  I was feeling pain.  I need pain pills.  I have problems with my nephew KJ.  He's young and he bring people to my place.  I cant keep him locked up.  He cleans I got to fight the devil.  Nobody want to do that.  The Depakote they gave me, made me act strange.  Tempting with evil  That was it.  I was getting in trouble this past Monday.  Artane is all I want.    The patient is very disorganized.  He is fidgety and making hand movements.  He has flight of ideas.  He is upset that he needs meds other than Artane.  Patient is on Depakote and Abilify additionally.   Consults:  psychiatry  Significant Diagnostic Studies:  labs: per ED  Discharge Vitals:   Blood pressure 110/77, pulse 82, temperature 99 F (37.2 C), temperature source Oral, resp. rate 18, height _0  (1.803 m), weight 109.317 kg (241 lb). Body mass index is 33.63 kg/(m^2). Lab Results:   Results for orders placed or performed during the hospital encounter of 09/10/14 (from the past 72 hour(s))  Comprehensive metabolic panel     Status: Abnormal   Collection Time: 09/10/14  7:03 PM  Result Value Ref Range   Sodium 141 135 - 145 mmol/L   Potassium 3.5 3.5 - 5.1 mmol/L   Chloride 106 101 - 111 mmol/L   CO2 28 22 - 32 mmol/L   Glucose, Bld 107 (H) 65 - 99 mg/dL   BUN 12 6 - 20 mg/dL   Creatinine, Ser 0.89 0.61 - 1.24 mg/dL   Calcium 9.6 8.9 - 10.3 mg/dL   Total Protein 8.2 (H) 6.5 - 8.1 g/dL   Albumin 4.5 3.5 - 5.0 g/dL   AST 28 15 - 41 U/L   ALT 25 17 - 63 U/L   Alkaline Phosphatase 71 38 - 126 U/L   Total Bilirubin 0.4 0.3 - 1.2 mg/dL   GFR calc non Af Amer >60 >60 mL/min   GFR calc Af Amer >60 >60 mL/min    Comment: (NOTE) The eGFR has been calculated using the CKD EPI equation. This calculation has not been validated in all clinical situations. eGFR's persistently <60 mL/min signify possible Chronic Kidney Disease.    Anion gap 7 5 - 15  Ethanol (ETOH)     Status: None    Collection Time: 09/10/14  7:03 PM  Result Value Ref Range   Alcohol, Ethyl (B) <5 <5 mg/dL    Comment:        LOWEST DETECTABLE LIMIT FOR SERUM ALCOHOL IS 5 mg/dL FOR MEDICAL PURPOSES ONLY   CBC     Status: Abnormal   Collection Time: 09/10/14  7:03 PM  Result Value Ref Range   WBC 11.2 (H) 4.0 - 10.5 K/uL  RBC 4.94 4.22 - 5.81 MIL/uL   Hemoglobin 14.9 13.0 - 17.0 g/dL   HCT 43.7 39.0 - 52.0 %   MCV 88.5 78.0 - 100.0 fL   MCH 30.2 26.0 - 34.0 pg   MCHC 34.1 30.0 - 36.0 g/dL   RDW 14.2 11.5 - 15.5 %   Platelets 335 150 - 400 K/uL  Urine rapid drug screen (hosp performed) (Not at [facility])     Status: Abnormal   Collection Time: 09/10/14  8:22 PM  Result Value Ref Range   Opiates NONE DETECTED NONE DETECTED   Cocaine POSITIVE (A) NONE DETECTED   Benzodiazepines NONE DETECTED NONE DETECTED   Amphetamines NONE DETECTED NONE DETECTED   Tetrahydrocannabinol NONE DETECTED NONE DETECTED   Barbiturates NONE DETECTED NONE DETECTED    Comment:        DRUG SCREEN FOR MEDICAL PURPOSES ONLY.  IF CONFIRMATION IS NEEDED FOR ANY PURPOSE, NOTIFY LAB WITHIN 5 DAYS.        LOWEST DETECTABLE LIMITS FOR URINE DRUG SCREEN Drug Class       Cutoff (ng/mL) Amphetamine      1000 Barbiturate      200 Benzodiazepine   680 Tricyclics       881 Opiates          300 Cocaine          300 THC              50     Physical Findings: AIMS: Facial and Oral Movements Muscles of Facial Expression: None, normal Lips and Perioral Area: None, normal Jaw: None, normal Tongue: None, normal,Extremity Movements Upper (arms, wrists, hands, fingers): None, normal Lower (legs, knees, ankles, toes): None, normal, Trunk Movements Neck, shoulders, hips: None, normal, Overall Severity Severity of abnormal movements (highest score from questions above): None, normal Incapacitation due to abnormal movements: None, normal Patient's awareness of abnormal movements (rate only patient's report): No Awareness, Dental  Status Current problems with teeth and/or dentures?: No Does patient usually wear dentures?: No  CIWA:    COWS:      See Psychiatric Specialty Exam and Suicide Risk Assessment completed by Attending Physician prior to discharge.  Discharge destination:  Home  Is patient on multiple antipsychotic therapies at discharge:  No   Has Patient had three or more failed trials of antipsychotic monotherapy by history:  No    Recommended Plan for Multiple Antipsychotic Therapies: NA     Medication List    ASK your doctor about these medications      Indication   cetirizine 10 MG tablet  Commonly known as:  ZYRTEC  Take 1 tablet (10 mg total) by mouth daily.   Indication:  Perennial Rhinitis     DEPAKOTE PO  Take 1 tablet by mouth 2 (two) times daily.      divalproex 250 MG DR tablet  Commonly known as:  DEPAKOTE  Take 3 tablets (750 mg total) by mouth 2 (two) times daily after a meal. For improved mood stability.   Indication:  Rapidly Alternating Manic-Depressive Psychosis     traZODone 100 MG tablet  Commonly known as:  DESYREL  Take 1 tablet (100 mg total) by mouth at bedtime.   Indication:  Aggressive Behavior, Trouble Sleeping     trihexyphenidyl 5 MG tablet  Commonly known as:  ARTANE  Take 1 tablet (5 mg total) by mouth 2 (two) times daily with a meal.   Indication:  Extrapyramidal Reaction caused by Medications  Follow-up recommendations:  Activity:  as tol, diet as tol  Comments:  Treatment Plan/Recommendations:  Admit for crisis management and mood stabilization. Medication management to re-stabilize current mood symptoms Group counseling sessions for coping skills Medical consults as needed Review and reinstate any pertinent home medications for other health problems   Total Discharge Time: 30 min  Signed: Freda Munro May Dalphine Cowie AGNP-BC 09/12/2014, 4:34 PM

## 2014-09-12 NOTE — Progress Notes (Signed)
D: Per patient self inventory form patient reports he slept poor last night. He reports poor appetite, low energy level, He rates depression 10/10, hopelessness 10/10, anxiety 10/10- all on 1/10 scale, 10 being the worse. He denies SI/HI. He denies physical pain. He reports his goal for the day is "to work on my attitude and not to be to foolish and to wise before my time. That's the reason I want to live." H e reports to meet his goal "To live my life, to go and live to at least 49 years old." Patient signed name Patient refused all morning medication and all medical interventions (vital signs). "I am not doing anything till I see a doctor." Speech pressured, rapid. Manic, hyperactive.   A:Special checks q 15 mins in place for safety. Encouragement and counseling provided. MD notified.  R:Safety maintained. Will continue to monitor.

## 2014-09-12 NOTE — [facility] Group Notes (Signed)
[facility] LCSW Aftercare Discharge Planning Group Note   09/12/2014 10:34 AM  Participation Quality:  Minimal  Mood/Affect:  Excited  Depression Rating:  denies  Anxiety Rating:  denies  Thoughts of Suicide:  No Will you contract for safety?   NA  Current AVH:  Denies  Plan for Discharge/Comments:  Delusional.  Pleasantly psychotic.  Recognizes me and greets me warmly.  Talking about demons and devils fighting inside him.  Bizarre appearance with aqua color head band.  Says his grandson assisted in getting him here.  Transportation Means:   Supports:  [name] B

## 2014-09-12 NOTE — [facility] Group Notes (Signed)
[facility] LCSW Group Therapy  09/12/2014 2:55 PM   Type of Therapy:  Group Therapy  Participation Level:  Active  Participation Quality:  Attentive  Affect:  Appropriate  Cognitive:  Appropriate  Insight:  Improving  Engagement in Therapy:  Engaged  Modes of Intervention:  Clarification, Education, Exploration and Socialization  Summary of Progress/Problems: Today's group focused on relapse prevention.  We defined the term, and then brainstormed on ways to prevent relapse.  Stayed the entire time, engaged throughout.  Unable to clearly track subject material, but undeterred, shared many humorous stories and anecdotes that made everyone laugh. Difficult to understand due to fast speech and poor diction.  Tangential with flight of ideas, pressured, grandiose, disorganized.    [name] B 09/12/2014 , 2:55 PM

## 2014-09-12 NOTE — Tx Team (Signed)
Interdisciplinary Treatment Plan Update (Adult)  Date:  09/12/2014   Time Reviewed:  10:39 AM   Progress in Treatment: Attending groups: Yes. Participating in groups:  Yes. Taking medication as prescribed:  Yes. Tolerating medication:  Yes. Family/Significant othe contact made:   Patient understands diagnosis:  No  Limited insight Discussing patient identified problems/goals with staff:  Yes, see initial care plan. Medical problems stabilized or resolved:  Yes. Denies suicidal/homicidal ideation: Yes. Issues/concerns per patient self-inventory:  No. Other:  New problem(s) identified:  Discharge Plan or Barriers:  unknown  Reason for Continuation of Hospitalization: Delusions  Hallucinations Medication stabilization  Comments: Gregory Meza is an 49 y.o. male presenting to ED at the suggestion of his case manager Ms. Caryl Asp, per pt report. At the time of assessment pt is alert and oriented to person, place, and time. Pt acknowledges he has AVH, then shortly after denies AVH and reports he is really experiencing battles with demons and the devil. Pt appears to be a poor historian and appears to be influenced by delusions at present. Pt denies SI, stating "I love myself, I told them I was suicidal but, I am not, I love myself." Pt reports he may have HI once in his life but not at present. He denies self harm and SA. Pt reports he is stressed worrying about his grandson and great grandson, and also about being falsely accused of kicking someone. Pt is afraid this person will press charges on him and he will end up in jail. Pt sts he wants to be admitted to [facility]. Pt is very guarded about his delusions reporting that if he speaks about them he will be putting himself and others in danger.   Abilify, Depakote trial   Estimated length of stay: 4-5 days  New goal(s):  Review of initial/current patient goals per problem list:   Review of initial/current patient goals per problem list:  1.  Goal(s): Patient will participate in aftercare plan   Met: No   Target date: 3-5 days post admission date   As evidenced by: Patient will participate within aftercare plan AEB aftercare provider and housing plan at discharge being identified.  09/12/2014:  Pt currently unable to carry on meaningful conversation to allow planning to take place.      5. Goal(s): Patient will demonstrate decreased signs of psychosis  * Met: No  * Target date: 3-5 days post admission date  * As evidenced by: Patient will demonstrate decreased frequency of AVH or return to baseline function  09/12/2014: Pt is currently peasantly psychotic and delusional.  Likely has not been taking medication for some time.  Likely substance abuse as well based on past history.      Attendees: Patient:  09/12/2014 10:39 AM   Family:   09/12/2014 10:39 AM   Physician:  Carlton Adam, MD 09/12/2014 10:39 AM   Nursing:   Festus Aloe, RN 09/12/2014 10:39 AM   CSW:    Roque Lias, LCSW   09/12/2014 10:39 AM   Other:  09/12/2014 10:39 AM   Other:   09/12/2014 10:39 AM   Other:  Lars Pinks, Nurse CM 09/12/2014 10:39 AM   Other:  Lucinda Dell, [facility] 09/12/2014 10:39 AM   Other:  Norberto Sorenson, Lorena  09/12/2014 10:39 AM   Other:  09/12/2014 10:39 AM   Other:  09/12/2014 10:39 AM   Other:  09/12/2014 10:39 AM   Other:  09/12/2014 10:39 AM   Other:  09/12/2014 10:39 AM  Other:   09/12/2014 10:39 AM    Scribe for Treatment Team:   Trish Mage, 09/12/2014 10:39 AM

## 2014-09-12 NOTE — Progress Notes (Signed)
Increased aggression noted on the unit, pacing. Difficult to redirect, disorganized. NP aware, will continue to monitor.

## 2014-09-12 NOTE — [facility] Suicide Risk Assessment (Signed)
[facility] Admission Suicide Risk Assessment   Nursing information obtained from:  Patient Demographic factors:  Male, Low socioeconomic status, Unemployed Current Mental Status:  NA Loss Factors:  NA Historical Factors:  Impulsivity Risk Reduction Factors:  NA Total Time spent with patient: 45 minutes Principal Problem: Schizophrenia, acute undifferentiated Diagnosis:   Patient Active Problem List   Diagnosis Date Noted  . 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 Schizoaffective disorder [F25.9] 03/24/2013  . Cocaine dependence [F14.20] 03/23/2013  . Cannabis dependence [F12.20] 03/23/2013  . Psychotic disorder [F29] 03/22/2013  . Schizophrenia [F20.9] 03/29/2012    Class: Chronic  . Substance abuse [F19.10] 03/29/2012    Class: Chronic  . Non-compliant patient [Z91.19] 03/29/2012    Class: Chronic     Continued Clinical Symptoms:  Alcohol Use Disorder Identification Test Final Score (AUDIT): 1 The "Alcohol Use Disorders Identification Test", Guidelines for Use in Primary Care, Second Edition.  World Science writer [facility]). Score between 0-7:  no or low risk or alcohol related problems. Score between 8-15:  moderate risk of alcohol related problems. Score between 16-19:  high risk of alcohol related problems. Score 20 or above:  warrants further diagnostic evaluation for alcohol dependence and treatment.   CLINICAL FACTORS:  49 year old male, known to our service from a prior admission in 2015. He has been diagnosed with Schizoaffective Disorder , Schizophrenia and Cocaine Abuse in the past . He presented to ED  Delusional, reporting battling  with the devil, very tangential, pressured,  agitated.  He denies any drug abuse recently,  but UDS is positive for cocaine. BAL is negative . He is a poor historian, and remains tangential and difficult to follow at times. He states he has been compliant with medications , and of note, he did have a therapeutic Valproic  Acid serum level upon admission.  As per chart notes, he is followed by ACT team.  At this time he denies hallucinations, denies SI or HI.  Denies medication side effects  Presents with involuntary movements of hands which may represent TD.  Dx- Schizoaffective Disorder, Cocaine Abuse  Plan-  Continue Depakote ER 750 mgrs BID- Abilify  5 mgrs BID. Decrease Artane  Dose  To 2 mgrs BID.       Musculoskeletal: Strength & Muscle Tone: within normal limits Gait & Station: normal Patient leans: N/A  Psychiatric Specialty Exam: Physical Exam  ROS denies chest pain, denies SOB, denies abdominal pain, denies nausea, denies vomiting  Blood pressure 110/77, pulse 82, temperature 99 F (37.2 C), temperature source Oral, resp. rate 18, height 5\' 11"  (1.803 m), weight 241 lb (109.317 kg).Body mass index is 33.63 kg/(m^2).  General Appearance: Fairly Groomed  Patent attorney::  Good  Speech:  Pressured  Volume:  Normal  Mood:  Irritable  Affect:  Labile  Thought Process:  Disorganized  Orientation:  Other:  alert and attentive   Thought Content:  delusional, at this time denies hallucinations  Suicidal Thoughts:  No- currently denies thoughts of hurting self or anyone else   Homicidal Thoughts:  No  Memory:  recent and remote poor   Judgement:  Impaired  Insight:  Lacking  Psychomotor Activity:  Increased  Concentration:  Fair  Recall:  [employer] of Knowledge:Good  Language: Fair  Akathisia:  Negative  Handed:  Right  AIMS (if indicated):     Assets:  Desire for Improvement Resilience  Sleep:  Number of Hours: 3.5  Cognition: alert and attentive   ADL's:  Impaired     COGNITIVE FEATURES THAT CONTRIBUTE TO RISK:  Closed-mindedness and Loss of executive function    SUICIDE RISK:   Moderate:  Frequent suicidal ideation with limited intensity, and duration, some specificity in terms of plans, no associated intent, good self-control, limited dysphoria/symptomatology, some risk factors  present, and identifiable protective factors, including available and accessible social support.  PLAN OF CARE: Patient will be admitted to inpatient psychiatric unit for stabilization and safety. Will provide and encourage milieu participation. Provide medication management and maked adjustments as needed.  Will follow daily.    Medical Decision Making:  Review of Psycho-Social Stressors (1), Review or order clinical lab tests (1), Established Problem, Worsening (2) and Review of Medication Regimen & Side Effects (2)  I certify that inpatient services furnished can reasonably be expected to improve the patient's condition.   COBOS, FERNANDO 09/12/2014, 7:11 PM

## 2014-09-13 DIAGNOSIS — F250 Schizoaffective disorder, bipolar type: Principal | ICD-10-CM

## 2014-09-13 MED ORDER — ARIPIPRAZOLE 10 MG PO TABS
10.0000 mg | ORAL_TABLET | Freq: Every day | ORAL | Status: DC
  Administered 2014-09-13 – 2014-09-14 (×2): 10 mg via ORAL
  Filled 2014-09-13 (×3): qty 1

## 2014-09-13 MED ORDER — DIVALPROEX SODIUM 500 MG PO DR TAB
500.0000 mg | DELAYED_RELEASE_TABLET | Freq: Every morning | ORAL | Status: DC
  Filled 2014-09-13 (×2): qty 1

## 2014-09-13 MED ORDER — DIVALPROEX SODIUM 500 MG PO DR TAB
1000.0000 mg | DELAYED_RELEASE_TABLET | Freq: Every day | ORAL | Status: DC
  Administered 2014-09-13 – 2014-09-24 (×12): 1000 mg via ORAL
  Filled 2014-09-13 (×11): qty 2
  Filled 2014-09-13: qty 6
  Filled 2014-09-13 (×2): qty 2

## 2014-09-13 MED ORDER — ARIPIPRAZOLE 10 MG PO TABS: 10.0000 mg | ORAL_TABLET | Freq: Once | ORAL | Status: DC

## 2014-09-13 NOTE — H&P (Signed)
Psychiatric Admission Assessment Adult  Patient Identification: Gregory Meza MRN:  [id number] Date of Evaluation:  09/13/2014 Chief Complaint:  Schizophrenia Principal Diagnosis: Schizoaffective disorder, bipolar type Diagnosis:   Patient Active Problem List   Diagnosis Date Noted  . Schizoaffective disorder, bipolar type [F25.0]   . 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 Schizoaffective disorder [F25.9] 03/24/2013  . Cocaine dependence [F14.20] 03/23/2013  . Cannabis dependence [F12.20] 03/23/2013  . Psychotic disorder [F29] 03/22/2013  . Schizophrenia [F20.9] 03/29/2012    Class: Chronic  . Substance abuse [F19.10] 03/29/2012    Class: Chronic  . Non-compliant patient [Z91.19] 03/29/2012    Class: Chronic   History of Present Illness: Gregory Meza is an 49 y.o. male initially presented to ED at the suggestion of his case manager Ms. Caryl Asp. Per tele report, patient was having AVH. Pt acknowledges he has AVH, then shortly after denies AVH and reports he is really experiencing battles with demons and the devil. Pt appears to be a poor historian and appears to be influenced by delusions at present.  Pt denies use of alcohol or drugs. UDS shows positive for cocaine.  Pt denies family hx of MH, SA, or SI concerns.   Patient was seen today. Patient states, "snakes goon and goblins. I was feeling pain. I need pain pills. I have problems with my nephew KJ. He's young and he bring people to my place. I cant keep him locked up. He cleans I got to fight the devil. Nobody want to do that. The Depakote they gave me, made me act strange.  I need to do something about my sex drive.  It's affecting my sex.  Tempting with evil That was it. I was getting in trouble this past Monday. Artane is all I want."   The patient is very disorganized. He is fidgety and making hand movements. He has flight of ideas. He is upset that he needs meds other than Artane.  Patient is on Depakote and Abilify additionally.  Elements:  Location:  psychosis. Quality:  disorganized, anxious, labile, irritable. Duration:  this week. Context:  see HPI. Associated Signs/Symptoms: Depression Symptoms:  difficulty concentrating, hopelessness, impaired memory, anxiety, (Hypo) Manic Symptoms:  Distractibility, Elevated Mood, Flight of Ideas, Impulsivity, Irritable Mood, Labiality of Mood, Anxiety Symptoms:  Excessive Worry, Psychotic Symptoms:  Paranoia, PTSD Symptoms: NA Total Time spent with patient: 45 minutes  Past Medical History:  Past Medical History  Diagnosis Date  . Schizophrenia    History reviewed. No pertinent past surgical history. Family History: History reviewed. No pertinent family history. Social History:  History  Alcohol Use  . 1.2 oz/week  . 1 Glasses of wine, 1 Cans of beer per week     History  Drug Use Not on file    History   Social History  . Marital Status: Single    Spouse Name: N/A  . Number of Children: N/A  . Years of Education: N/A   Social History Main Topics  . Smoking status: Current Every Day Smoker -- 1.50 packs/day for 3 years    Types: Cigarettes  . Smokeless tobacco: Not on file  . Alcohol Use: 1.2 oz/week    1 Glasses of wine, 1 Cans of beer per week  . Drug Use: Not on file  . Sexual Activity: Not Currently   Other Topics Concern  . None   Social History Narrative   Additional Social History:    History of alcohol / drug use?: Yes  Musculoskeletal:  Strength & Muscle Tone: within normal limits Gait & Station: normal Patient leans: N/A  Psychiatric Specialty Exam: Physical Exam  Vitals reviewed.   Review of Systems  Respiratory: Negative for cough.   Cardiovascular: Negative for chest pain.  Neurological: Negative for headaches.  All other systems reviewed and are negative.   Blood pressure 110/77, pulse 82, temperature 99 F (37.2 C), temperature source Oral, resp. rate 18,  height 5' 11" (1.803 m), weight 109.317 kg (241 lb).Body mass index is 33.63 kg/(m^2).   General Appearance: Bizarre  Eye Contact:: Good  Speech: Blocked, Garbled and Pressured  Volume: Normal  Mood: Anxious and Irritable  Affect: Non-Congruent, Labile and Full Range  Thought Process: Disorganized, Irrelevant, Loose and Tangential  Orientation: Full (Time, Place, and Person)  Thought Content: Hallucinations: Auditory  Suicidal Thoughts: No  Homicidal Thoughts: No  Memory: Immediate; Poor Recent; Poor Remote; Poor  Judgement: Poor  Insight: Lacking  Psychomotor Activity: fidgety, hand motions  Concentration: Poor  Recall: [place]  Language: Fair  Akathisia: Negative  Handed: Right  AIMS (if indicated):    Assets: Physical Health  ADL's: Intact  Cognition: Impaired, Moderate  Sleep: Number of Hours: 3.5       Risk to Self: Is patient at risk for suicide?: No Risk to Others:   Prior Inpatient Therapy:   Prior Outpatient Therapy:    Alcohol Screening: 1. How often do you have a drink containing alcohol?: Monthly or less 2. How many drinks containing alcohol do you have on a typical day when you are drinking?: 1 or 2 3. How often do you have six or more drinks on one occasion?: Never Preliminary Score: 0 9. Have you or someone else been injured as a result of your drinking?: No 10. Has a relative or friend or a doctor or another health worker been concerned about your drinking or suggested you cut down?: No Alcohol Use Disorder Identification Test Final Score (AUDIT): 1 Brief Intervention: AUDIT score less than 7 or less-screening does not suggest unhealthy drinking-brief intervention not indicated  Allergies:   Allergies  Allergen Reactions  . Haldol [Haloperidol]     Pt reports he is allergic to injectable and PO haldol  . Risperidone And Related     Pt reports he is allergic to injectable  and PO    Lab Results: No results found for this or any previous visit (from the past 48 hour(s)). Current Medications: Current Facility-Administered Medications  Medication Dose Route Frequency Provider Last Rate Last Dose  . ARIPiprazole (ABILIFY) tablet 5 mg  5 mg Oral BID PC Delfin Gant, NP   5 mg at 09/11/14 1828  . divalproex (DEPAKOTE) DR tablet 750 mg  750 mg Oral BID PC Delfin Gant, NP   750 mg at 09/11/14 1828  . LORazepam (ATIVAN) tablet 1 mg  1 mg Oral Q8H PRN Jenne Campus, MD      . traZODone (DESYREL) tablet 100 mg  100 mg Oral QHS Delfin Gant, NP   100 mg at 09/11/14 2210  . trihexyphenidyl (ARTANE) tablet 2 mg  2 mg Oral BID WC Jenne Campus, MD   2 mg at 09/13/14 0800   PTA Medications: Prescriptions prior to admission  Medication Sig Dispense Refill Last Dose  . Divalproex Sodium (DEPAKOTE PO) Take 1 tablet by mouth 2 (two) times daily.   09/11/2014 at Unknown time  . cetirizine (ZYRTEC) 10 MG tablet Take 1 tablet (10 mg  total) by mouth daily. (Patient not taking: Reported on 09/10/2014)     . divalproex (DEPAKOTE) 250 MG DR tablet Take 3 tablets (750 mg total) by mouth 2 (two) times daily after a meal. For improved mood stability. 180 tablet 0   . traZODone (DESYREL) 100 MG tablet Take 1 tablet (100 mg total) by mouth at bedtime. 30 tablet 0   . trihexyphenidyl (ARTANE) 5 MG tablet Take 1 tablet (5 mg total) by mouth 2 (two) times daily with a meal. 60 tablet 0     Previous Psychotropic Medications: Yes   Substance Abuse History in the last 12 months:  Yes.      Consequences of Substance Abuse: NA  Results for orders placed or performed during the hospital encounter of 09/10/14 (from the past 72 hour(s))  Comprehensive metabolic panel     Status: Abnormal   Collection Time: 09/10/14  7:03 PM  Result Value Ref Range   Sodium 141 135 - 145 mmol/L   Potassium 3.5 3.5 - 5.1 mmol/L   Chloride 106 101 - 111 mmol/L   CO2 28 22 - 32 mmol/L    Glucose, Bld 107 (H) 65 - 99 mg/dL   BUN 12 6 - 20 mg/dL   Creatinine, Ser 0.89 0.61 - 1.24 mg/dL   Calcium 9.6 8.9 - 10.3 mg/dL   Total Protein 8.2 (H) 6.5 - 8.1 g/dL   Albumin 4.5 3.5 - 5.0 g/dL   AST 28 15 - 41 U/L   ALT 25 17 - 63 U/L   Alkaline Phosphatase 71 38 - 126 U/L   Total Bilirubin 0.4 0.3 - 1.2 mg/dL   GFR calc non Af Amer >60 >60 mL/min   GFR calc Af Amer >60 >60 mL/min    Comment: (NOTE) The eGFR has been calculated using the CKD EPI equation. This calculation has not been validated in all clinical situations. eGFR's persistently <60 mL/min signify possible Chronic Kidney Disease.    Anion gap 7 5 - 15  Ethanol (ETOH)     Status: None   Collection Time: 09/10/14  7:03 PM  Result Value Ref Range   Alcohol, Ethyl (B) <5 <5 mg/dL    Comment:        LOWEST DETECTABLE LIMIT FOR SERUM ALCOHOL IS 5 mg/dL FOR MEDICAL PURPOSES ONLY   CBC     Status: Abnormal   Collection Time: 09/10/14  7:03 PM  Result Value Ref Range   WBC 11.2 (H) 4.0 - 10.5 K/uL   RBC 4.94 4.22 - 5.81 MIL/uL   Hemoglobin 14.9 13.0 - 17.0 g/dL   HCT 43.7 39.0 - 52.0 %   MCV 88.5 78.0 - 100.0 fL   MCH 30.2 26.0 - 34.0 pg   MCHC 34.1 30.0 - 36.0 g/dL   RDW 14.2 11.5 - 15.5 %   Platelets 335 150 - 400 K/uL  Urine rapid drug screen (hosp performed) (Not at [facility])     Status: Abnormal   Collection Time: 09/10/14  8:22 PM  Result Value Ref Range   Opiates NONE DETECTED NONE DETECTED   Cocaine POSITIVE (A) NONE DETECTED   Benzodiazepines NONE DETECTED NONE DETECTED   Amphetamines NONE DETECTED NONE DETECTED   Tetrahydrocannabinol NONE DETECTED NONE DETECTED   Barbiturates NONE DETECTED NONE DETECTED    Comment:        DRUG SCREEN FOR MEDICAL PURPOSES ONLY.  IF CONFIRMATION IS NEEDED FOR ANY PURPOSE, NOTIFY LAB WITHIN 5 DAYS.  LOWEST DETECTABLE LIMITS FOR URINE DRUG SCREEN Drug Class       Cutoff (ng/mL) Amphetamine      1000 Barbiturate      200 Benzodiazepine   202 Tricyclics        542 Opiates          300 Cocaine          300 THC              50     Observation Level/Precautions:  15 minute checks  Laboratory:  per ED  Psychotherapy:  group  Medications:  As per medlist  Consultations:  As needed  Discharge Concerns:  safety  Estimated LOS:  5-7 days  Other:     Psychological Evaluations: Yes   Treatment Plan Summary: Admit for crisis management and mood stabilization. Medication management to re-stabilize current mood symptoms Group counseling sessions for coping skills Medical consults as needed Review and reinstate any pertinent home medications for other health problems   Medical Decision Making:  Review of Psycho-Social Stressors (1), Discuss test with performing physician (1), Decision to obtain old records (1), Review and summation of old records (2), Review of Medication Regimen & Side Effects (2) and Review of New Medication or Change in Dosage (2)  I certify that inpatient services furnished can reasonably be expected to improve the patient's condition.   [place] AGNP-BC 7/28/20168:51 AM

## 2014-09-13 NOTE — Progress Notes (Signed)
D: Patient refused all am medication, vital signs, and labwork this am. Patient would only say that he didn't like the way a medication he took the other day made him feel. Patient changed the subject and was talking about how too many different people are seeing him and he just wants to see the doctor not nurse practitioners. Patient appears anxious with a small amount of agitation. Restless sitting in the chair. A: Staff will monitor on q 15 minute checks, follow treatment plan, and attempt to give medications. R: Appropriate at this time

## 2014-09-13 NOTE — Plan of Care (Signed)
Problem: Alteration in thought process Goal: LTG-Patient verbalizes understanding importance med regimen (Patient verbalizes understanding of importance of medication regimen and need to continue outpatient care.)  Outcome: Not Met (add Reason) Continues to refuse medications

## 2014-09-13 NOTE — Progress Notes (Signed)
D: Patient is alert and oriented x 3. Patient denies SI/HI/AVH/Pain. Patient was very upset wanting a cigarette, stating "The doctor said I can have three a day." I advised patient that this is a non smoking facility. The doctor has ordered ativan 3 times a day PRN.  Patient stated, "then I am not taking any medications if I can't smoke." This Clinical research associate offered patient a nicotine patch and gun and patient refused. Patient was very aggressive when this writer would talk with him about medications and during my assessment. Patient did participate in evening group.  A: Staff to monitor Q 15 mins for safety. Encouragement and support offered. Scheduled medications administered per orders. R: Patient remains safe on the unit. Patient attended group tonight. Patient visible on the unit and interacting with peers. Patient taking administered medications.

## 2014-09-13 NOTE — [facility] Group Notes (Signed)
[facility] Group Notes:  (Nursing/MHT/Case Management/Adjunct)  Date:  09/13/2014  Time:  0930  Type of Therapy:  Nurse Education  Participation Level:  Active  Participation Quality:  Appropriate and Attentive  Affect:  Flat  Cognitive:  Alert and Appropriate  Insight:  Improving  Engagement in Group:  Engaged  Modes of Intervention:  Problem-solving and Support  Summary of Progress/Problems: Patient answering some questions appropriately but going often going to other subjects after answering the question  Andres Ege 09/13/2014, 3:28 PM

## 2014-09-13 NOTE — [facility] Group Notes (Signed)
[facility] Group Notes:  (Counselor/Nursing/MHT/Case Management/Adjunct)  09/13/2014 1:15PM  Type of Therapy:  Group Therapy  Participation Level:  Active  Participation Quality:  Appropriate  Affect:  Flat  Cognitive:  Oriented  Insight:  Improving  Engagement in Group:  Limited  Engagement in Therapy:  Limited  Modes of Intervention:  Discussion, Exploration and Socialization  Summary of Progress/Problems: The topic for group was balance in life.  Pt participated in the discussion about when their life was in balance and out of balance and how this feels.  Pt discussed ways to get back in balance and short term goals they can work on to get where they want to be. In and out of group multiple times.  Sat in a different chair each time he came back.  Disorganized, unable to track.  "I only got one strike.  You need three strikes to be out."   [name] B 09/13/2014 4:05 PM

## 2014-09-13 NOTE — Progress Notes (Signed)
[facility] MD Progress Note  09/13/2014 4:14 PM Gregory Meza  MRN:  [id number] Subjective:   Pt seen and chart reviewed. Pt presents with pressured speech yet is coherent enough for linear conversation and is attentive during assessment. Pt has been refusing medications. However, Dr. Lucianne Muss was able to convince pt to take medication if at night. Pt agrees that he will take medication this evening so that he can seek improvement in order to discharge home. Pt does not endorse suicidal/homicidal ideation at this time and has shown no gestures of either. However, staff report that pt continues to present as psychotic talking about battles with demons and the devil. Will monitor for improvement if and when pt compliant with medications as ordered this evening.   Interval History 09/12/2014:  Gregory Meza is an 50 y.o. male initially presented to ED at the suggestion of his case manager Ms. Ander Slade. Per tele report, patient was having AVH. Pt acknowledges he has AVH, then shortly after denies AVH and reports he is really experiencing battles with demons and the devil. Pt appears to be a poor historian and appears to be influenced by delusions at present. Pt denies use of alcohol or drugs. UDS shows positive for cocaine.  Pt denies family hx of MH, SA, or SI concerns.   Patient was seen today. Patient states, "snakes goon and goblins. I was feeling pain. I need pain pills. I have problems with my nephew KJ. He's young and he bring people to my place. I cant keep him locked up. He cleans I got to fight the devil. Nobody want to do that. The Depakote they gave me, made me act strange. I need to do something about my sex drive. It's affecting my sex. Tempting with evil That was it. I was getting in trouble this past Monday. Artane is all I want."   The patient is very disorganized. He is fidgety and making hand movements. He has flight of ideas. He is upset that he needs meds other than  Artane. Patient is on Depakote and Abilify additionally.  Principal Problem: Schizoaffective disorder, bipolar type Diagnosis:   Patient Active Problem List   Diagnosis Date Noted  . Schizoaffective disorder, bipolar type [F25.0]   . 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 Schizoaffective disorder [F25.9] 03/24/2013  . Cocaine dependence [F14.20] 03/23/2013  . Cannabis dependence [F12.20] 03/23/2013  . Psychotic disorder [F29] 03/22/2013  . Schizophrenia [F20.9] 03/29/2012    Class: Chronic  . Substance abuse [F19.10] 03/29/2012    Class: Chronic  . Non-compliant patient [Z91.19] 03/29/2012    Class: Chronic   Total Time spent with patient: 25 minutes  Past Medical History:  Past Medical History  Diagnosis Date  . Schizophrenia    History reviewed. No pertinent past surgical history. Family History: History reviewed. No pertinent family history. Social History:  History  Alcohol Use  . 1.2 oz/week  . 1 Glasses of wine, 1 Cans of beer per week     History  Drug Use Not on file    History   Social History  . Marital Status: Single    Spouse Name: N/A  . Number of Children: N/A  . Years of Education: N/A   Social History Main Topics  . Smoking status: Current Every Day Smoker -- 1.50 packs/day for 3 years    Types: Cigarettes  . Smokeless tobacco: Not on file  . Alcohol Use: 1.2 oz/week    1 Glasses of wine, 1 Cans of  beer per week  . Drug Use: Not on file  . Sexual Activity: Not Currently   Other Topics Concern  . None   Social History Narrative   Additional History:    Sleep: Fair  Appetite:  Fair   Assessment: See above  Musculoskeletal: Strength & Muscle Tone: within normal limits Gait & Station: normal Patient leans: N/A   Psychiatric Specialty Exam: Physical Exam  Review of Systems  Psychiatric/Behavioral: Positive for depression and hallucinations (pt is clearly psychotic talking to staff about demons/devils but can  focus for the assessment). Negative for suicidal ideas. The patient is nervous/anxious.   All other systems reviewed and are negative.   Blood pressure 110/77, pulse 82, temperature 99 F (37.2 C), temperature source Oral, resp. rate 18, height  (1.803 m), weight 109.317 kg (241 lb).Body mass index is 33.63 kg/(m^2).  General Appearance: Bizarre and Disheveled  Eye Contact::  Fair  Speech:  Pressured  Volume:  Increased  Mood:  Anxious and Irritable  Affect:  Appropriate, Inappropriate and Labile  Thought Process:  Circumstantial and Goal Directed  Orientation:  Full (Time, Place, and Person)  Thought Content:  WDL during assessment, although psychotic in general talking about demons and devils with staff; refusing meds, but agrees to try this evening  Suicidal Thoughts:  No  Homicidal Thoughts:  No  Memory:  Immediate;   Fair Recent;   Fair Remote;   Fair  Judgement:  Fair  Insight:  Fair  Psychomotor Activity:  Normal  Concentration:  Good  Recall:  Good  Fund of Knowledge:Good  Language: Good  Akathisia:  No  Handed:    AIMS (if indicated):     Assets:  Communication Skills Desire for Improvement Resilience Social Support  ADL's:  Intact  Cognition: WNL  Sleep:  Number of Hours: 3     Current Medications: Current Facility-Administered Medications  Medication Dose Route Frequency Provider Last Rate Last Dose  . ARIPiprazole (ABILIFY) tablet 5 mg  5 mg Oral BID PC Earney Navy, NP   5 mg at 09/11/14 1828  . divalproex (DEPAKOTE) DR tablet 750 mg  750 mg Oral BID PC Earney Navy, NP   750 mg at 09/11/14 1828  . LORazepam (ATIVAN) tablet 1 mg  1 mg Oral Q8H PRN Craige Cotta, MD      . traZODone (DESYREL) tablet 100 mg  100 mg Oral QHS Earney Navy, NP   100 mg at 09/11/14 2210  . trihexyphenidyl (ARTANE) tablet 2 mg  2 mg Oral BID WC Craige Cotta, MD   2 mg at 09/13/14 0800    Lab Results: No results found for this or any previous visit  (from the past 48 hour(s)).  Physical Findings: AIMS: Facial and Oral Movements Muscles of Facial Expression: None, normal Lips and Perioral Area: None, normal Jaw: None, normal Tongue: None, normal,Extremity Movements Upper (arms, wrists, hands, fingers): None, normal Lower (legs, knees, ankles, toes): None, normal, Trunk Movements Neck, shoulders, hips: None, normal, Overall Severity Severity of abnormal movements (highest score from questions above): None, normal Incapacitation due to abnormal movements: None, normal Patient's awareness of abnormal movements (rate only patient's report): No Awareness, Dental Status Current problems with teeth and/or dentures?: No Does patient usually wear dentures?: No  CIWA:    COWS:     Treatment Plan Summary: Daily contact with patient to assess and evaluate symptoms and progress in treatment and Medication management  Medications: -Change Abilify to  qhs  for psychosis -Change Depakote to 1000mg  qhs and 500mg  qam for mood stabilization -Continue Trazodone 100mg  qhs for insomnia -Continue Artane 2mg  bid for EPS prophylaxis (pt likely to be compliant now) -Continue Ativan 1mg  PO q8h prn severe agitation  Medical Decision Making:  Established Problem, Stable/Improving (1), Review of Psycho-Social Stressors (1), Review or order clinical lab tests (1), Review of Medication Regimen & Side Effects (2) and Review of New Medication or Change in Dosage (2)   Beau Fanny, FNP-BC 09/13/2014, 4:14 PM   Patient seen, evaluated by me,treatment plan and assessment done by me. Patient's medications changed to night, discussed in length with patient the need to take medications as prescribed. Discussed long acting injectable, patient refuses Nelly Rout, MD

## 2014-09-13 NOTE — Progress Notes (Signed)
Patient ID: Gregory Meza, male   DOB: [birth year], 49 y.o.   MRN: [id number]  D: Patient still refuses certain things when asked. He refused EKG today even with encouragement. Talking about taking medication tonight but has refused thus far today. Patient has pressured speech and thinking tangential but no verbal aggression. Patient talking with other peers in dayroom and participated in group this am. A: Staff will continue to monitor R: Calm in dayroom

## 2014-09-13 NOTE — Progress Notes (Signed)
Patient ID: Gregory Meza, male   DOB: [birth year], 49 y.o.   MRN: [id number] D: Client visible on the unit in dayroom, noticed interacting with a male client, was redirected by staff when they began holding hands. Client told Clinical research associate "I get my medications tonight." Client interacts when talking, using hand gestures. A: Writer provided emotional support, introducing self to client and reaffirming medication administration times. Client encouraged to take medication and report to staff any concerns. Staff will monitor q98min for safety. R: Client is safe on the unit, attended karaoke.

## 2014-09-14 NOTE — Progress Notes (Addendum)
Pt is very loud and refused to take any of his am meds stating,"they make me feel funny and tired." pt did take a 40m of ativan for extreme agitation. He met with social work and stated,"I will take that controlled substance twice a day nurse." Pt does appear dishelved. He rates his depression 5/10 an d his anxiety a 6/10.Pt stated," I am the medicine man let me in." Pt continues to talk with lots of hand motions. 6:30pm- Pt is sexually inappropriate and making advances at the staff. Pt refused his pm meds. Stating they make him to tired.

## 2014-09-14 NOTE — Progress Notes (Signed)
Pt attended karaoke group this evening.  

## 2014-09-14 NOTE — Progress Notes (Signed)
[facility] MD Progress Note  09/14/2014 2:05 PM Gregory Meza  MRN:  [id number] Subjective:   Pt seen and chart reviewed. Pt presents with pressured speech yet is coherent enough for linear conversation and is attentive during assessment.  Discussed dosing and frequency of Depakote.  Depakote dose changed to QHS and patient is satisfied with the plan.  Pt does not endorse suicidal/homicidal ideations.  Continues to have pressured speech during course of follow up assessment.  Patient states that goal today was to do what he can to get better like taking a shower.  He does not appear to be responding to internal stimuli and not endorsing SI/HI/AVH.  Interval History 09/12/2014:  Gregory Meza is an 49 y.o. male initially presented to ED at the suggestion of his case manager Ms. Ander Slade. Per tele report, patient was having AVH. Pt acknowledges he has AVH, then shortly after denies AVH and reports he is really experiencing battles with demons and the devil. Pt appears to be a poor historian and appears to be influenced by delusions at present. Pt denies use of alcohol or drugs. UDS shows positive for cocaine.  Pt denies family hx of MH, SA, or SI concerns.   Patient was seen today. Patient states, "snakes goon and goblins. I was feeling pain. I need pain pills. I have problems with my nephew KJ. He's young and he bring people to my place. I cant keep him locked up. He cleans I got to fight the devil. Nobody want to do that. The Depakote they gave me, made me act strange. I need to do something about my sex drive. It's affecting my sex. Tempting with evil That was it. I was getting in trouble this past Monday. Artane is all I want."   The patient is very disorganized. He is fidgety and making hand movements. He has flight of ideas. He is upset that he needs meds other than Artane. Patient is on Depakote and Abilify additionally.  Principal Problem: Schizoaffective disorder, bipolar  type Diagnosis:   Patient Active Problem List   Diagnosis Date Noted  . Schizoaffective disorder, bipolar type [F25.0]   . 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 Schizoaffective disorder [F25.9] 03/24/2013  . Cocaine dependence [F14.20] 03/23/2013  . Cannabis dependence [F12.20] 03/23/2013  . Psychotic disorder [F29] 03/22/2013  . Schizophrenia [F20.9] 03/29/2012    Class: Chronic  . Substance abuse [F19.10] 03/29/2012    Class: Chronic  . Non-compliant patient [Z91.19] 03/29/2012    Class: Chronic   Total Time spent with patient: 25 minutes  Past Medical History:  Past Medical History  Diagnosis Date  . Schizophrenia    History reviewed. No pertinent past surgical history. Family History: History reviewed. No pertinent family history. Social History:  History  Alcohol Use  . 1.2 oz/week  . 1 Glasses of wine, 1 Cans of beer per week     History  Drug Use Not on file    History   Social History  . Marital Status: Single    Spouse Name: N/A  . Number of Children: N/A  . Years of Education: N/A   Social History Main Topics  . Smoking status: Current Every Day Smoker -- 1.50 packs/day for 3 years    Types: Cigarettes  . Smokeless tobacco: Not on file  . Alcohol Use: 1.2 oz/week    1 Glasses of wine, 1 Cans of beer per week  . Drug Use: Not on file  . Sexual Activity: Not Currently  Other Topics Concern  . None   Social History Narrative   Additional History:    Sleep: Fair  Appetite:  Fair   Assessment: See above  Musculoskeletal: Strength & Muscle Tone: within normal limits Gait & Station: normal Patient leans: N/A   Psychiatric Specialty Exam: Physical Exam  Review of Systems  Constitutional: Negative.  Negative for fever, chills, weight loss and malaise/fatigue.  HENT: Negative.  Negative for congestion and sore throat.   Eyes: Negative.  Negative for blurred vision, double vision, discharge and redness.  Respiratory:  Negative.  Negative for cough, shortness of breath and wheezing.   Cardiovascular: Negative.  Negative for chest pain and palpitations.  Gastrointestinal: Negative.  Negative for heartburn, nausea, vomiting, abdominal pain, diarrhea and constipation.  Genitourinary: Negative.   Musculoskeletal: Negative.  Negative for myalgias and falls.  Skin: Negative.  Negative for rash.  Neurological: Negative.  Negative for dizziness, seizures, loss of consciousness and headaches.  Endo/Heme/Allergies: Negative.  Negative for environmental allergies.  Psychiatric/Behavioral: Positive for depression and hallucinations (pt is clearly psychotic talking to staff about demons/devils but can focus for the assessment). Negative for suicidal ideas and memory loss. The patient is nervous/anxious. The patient does not have insomnia.   All other systems reviewed and are negative.   Blood pressure 110/77, pulse 82, temperature 99 F (37.2 C), temperature source Oral, resp. rate 18, height  (1.803 m), weight 109.317 kg (241 lb).Body mass index is 33.63 kg/(m^2).  General Appearance: Bizarre and Disheveled  Eye Contact::  Fair  Speech:  Pressured  Volume:  Increased  Mood:  Anxious and Irritable  Affect:  Appropriate, Inappropriate and Labile  Thought Process:  Circumstantial and Goal Directed  Orientation:  Full (Time, Place, and Person)  Thought Content:  Delusions   Suicidal Thoughts:  No  Homicidal Thoughts:  No  Memory:  Immediate;   Fair Recent;   Fair Remote;   Fair  Judgement:  Fair  Insight:  Fair  Psychomotor Activity:  Normal  Concentration:  Good  Recall:  Good  Fund of Knowledge:Good  Language: Good  Akathisia:  No  Handed:    AIMS (if indicated):     Assets:  Communication Skills Desire for Improvement Resilience Social Support  ADL's:  Intact  Cognition: WNL  Sleep:  Number of Hours: 0.25     Current Medications: Current Facility-Administered Medications  Medication Dose  Route Frequency Provider Last Rate Last Dose  . ARIPiprazole (ABILIFY) tablet 10 mg  10 mg Oral QHS Beau Fanny, FNP   10 mg at 09/13/14 2138  . divalproex (DEPAKOTE) DR tablet 1,000 mg  1,000 mg Oral QHS Beau Fanny, FNP   1,000 mg at 09/13/14 2137  . LORazepam (ATIVAN) tablet 1 mg  1 mg Oral Q8H PRN Craige Cotta, MD   1 mg at 09/14/14 0826  . traZODone (DESYREL) tablet 100 mg  100 mg Oral QHS Earney Navy, NP   100 mg at 09/11/14 2210  . trihexyphenidyl (ARTANE) tablet 2 mg  2 mg Oral BID WC Craige Cotta, MD   2 mg at 09/13/14 2138    Lab Results: No results found for this or any previous visit (from the past 48 hour(s)).  Physical Findings: AIMS: Facial and Oral Movements Muscles of Facial Expression: None, normal Lips and Perioral Area: None, normal Jaw: None, normal Tongue: None, normal,Extremity Movements Upper (arms, wrists, hands, fingers): None, normal Lower (legs, knees, ankles, toes): None, normal, Trunk Movements Neck,  shoulders, hips: None, normal, Overall Severity Severity of abnormal movements (highest score from questions above): None, normal Incapacitation due to abnormal movements: None, normal Patient's awareness of abnormal movements (rate only patient's report): No Awareness, Dental Status Current problems with teeth and/or dentures?: No Does patient usually wear dentures?: No  CIWA:    COWS:     Treatment Plan Summary: Daily contact with patient to assess and evaluate symptoms and progress in treatment and Medication management  Medications: -Continue Abilify 10mg  qhs for psychosis - Continue Depakote 1000mg  qhs and discontinue 500 mg every morning as patient refuses to take any medication the morning. Patient's not willing to take 1500 mg at bedtime either -Continue Trazodone 100mg  qhs for insomnia -Continue Artane 2mg  bid for EPS prophylaxis (pt likely to be compliant now) -Continue Ativan 1mg  PO q8h prn severe agitation  Medical  Decision Making:  Established Problem, Stable/Improving (1), Review of Psycho-Social Stressors (1), Review or order clinical lab tests (1), Review of Medication Regimen & Side Effects (2) and Review of New Medication or Change in Dosage (2)   Velna Hatchet May Agustin, AGNP-BC 09/14/2014, 2:05 PM   Patient seen, evaluated by me, treatment plan formulated by me. Patient refuses to take a.m. dose of Depakote and is not willing to increase the p.m. dose to 1500 mg. Patient states that he will only take thousand milligrams at bedtime of Depakote. Patient reports that he sleeping better and said to continue the trazodone 100 mg at bedtime for sleep. Into the Abilify 10 mg at bedtime for psychosis. Nelly Rout, MD

## 2014-09-14 NOTE — [facility] Group Notes (Signed)
[facility] LCSW Aftercare Discharge Planning Group Note   09/14/2014 1:22 PM  Participation Quality:  Invited.  Chose to not attend    [place], Thereasa Distance B

## 2014-09-14 NOTE — [facility] Group Notes (Signed)
[facility] LCSW Group Therapy  09/14/2014  1:05 PM  Type of Therapy:  Group therapy  Participation Level:  Active  Participation Quality:  Attentive  Affect:  Flat  Cognitive:  Oriented  Insight:  Limited  Engagement in Therapy:  Limited  Modes of Intervention:  Discussion, Socialization  Summary of Progress/Problems:  Chaplain was here to lead a group on themes of hope and courage.  "Do you have any bibles with pictures of Jesus?  I want to know what he looks like.  Like the Koran, they have 25 different names for Jesus, and I need to memorize them all."  Mood good.  Disorganized.  Difficult to redirect.  He would hear something that triggered him, and start talking.  [name] B 09/14/2014 1:23 PM

## 2014-09-14 NOTE — Plan of Care (Signed)
Problem: Alteration in thought process Goal: LTG-Patient has not harmed self or others in at least 2 days Outcome: Progressing Client is safe on the unit, no harm to self AEB q86min safety checks, admitting he is not at harm to himself or anyone else, taking medications tonight.

## 2014-09-14 NOTE — [facility] Suicide Risk Assessment (Signed)
[facility] INPATIENT:  Family/Significant Other Suicide Prevention Education  Suicide Prevention Education:  Education Completed; No one has been identified by the patient as the family member/significant other with whom the patient will be residing, and identified as the person(s) who will aid the patient in the event of a mental health crisis (suicidal ideations/suicide attempt).  With written consent from the patient, the family member/significant other has been provided the following suicide prevention education, prior to the and/or following the discharge of the patient.  The suicide prevention education provided includes the following:  Suicide risk factors  Suicide prevention and interventions  National Suicide Hotline telephone number  [facility] assessment telephone number  [facility] Emergency Assistance 911  [facility] and/or Residential Mobile Crisis Unit telephone number  Request made of family/significant other to:  Remove weapons (e.g., guns, rifles, knives), all items previously/currently identified as safety concern.    Remove drugs/medications (over-the-counter, prescriptions, illicit drugs), all items previously/currently identified as a safety concern.  The family member/significant other verbalizes understanding of the suicide prevention education information provided.  The family member/significant other agrees to remove the items of safety concern listed above.  The patient did not endorse SI at the time of admission, nor did the patient c/o SI during the stay here.  SPE not required.   [name] B 09/14/2014, 4:02 PM

## 2014-09-14 NOTE — Progress Notes (Signed)
Patient ID: Gregory Meza, male   DOB: 08/01/1965, 49 y.o.   MRN: [id number] D: Client visible on the unit, watching TV in dayroom, reports "relaxing today" "I passed I did everything" Client denies AVH, but visibly preoccupied talking to self and making gestures. Client was also witnessed by staff coming down the hall with penis hanging out pants, reporting scrubs are too big. A: Writer provided emotional support encouraged medication. Staff will monitor q16min for safety. R:Client is on the unit, attended group.Client redirected and asked to pull up scrubs.

## 2014-09-14 NOTE — Progress Notes (Signed)
The focus of this group is to help patients review their daily goal of treatment and discuss progress on daily workbooks. Pt attended the evening group session but had difficulty responding on-topic to discussion prompts from the Writer. Pt required redirection several times to remain appropriate, sharing several times how he finds it difficult to listen to women if they arouse him sexually. "I just can't listen to any of these nurses around here with their pretty hair." Pt reported having no additional needs from Nursing Staff this evening. Pt appeared distracted through most of group, making hand gestures and singing to himself.

## 2014-09-15 MED ORDER — DIPHENHYDRAMINE HCL 50 MG PO CAPS
50.0000 mg | ORAL_CAPSULE | Freq: Once | ORAL | Status: AC
  Administered 2014-09-15: 50 mg via ORAL
  Filled 2014-09-15: qty 1

## 2014-09-15 MED ORDER — LORAZEPAM 2 MG/ML IJ SOLN
INTRAMUSCULAR | Status: AC
  Administered 2014-09-15: 1 mg via INTRAMUSCULAR
  Filled 2014-09-15: qty 1

## 2014-09-15 MED ORDER — OLANZAPINE 5 MG PO TBDP
ORAL_TABLET | ORAL | Status: AC
  Filled 2014-09-15: qty 1

## 2014-09-15 MED ORDER — ARIPIPRAZOLE 15 MG PO TABS
15.0000 mg | ORAL_TABLET | Freq: Every day | ORAL | Status: DC
  Administered 2014-09-15 – 2014-09-17 (×3): 15 mg via ORAL
  Filled 2014-09-15 (×4): qty 1

## 2014-09-15 MED ORDER — ZIPRASIDONE MESYLATE 20 MG IM SOLR
20.0000 mg | Freq: Two times a day (BID) | INTRAMUSCULAR | Status: DC | PRN
  Administered 2014-09-15: 20 mg via INTRAMUSCULAR

## 2014-09-15 MED ORDER — LORAZEPAM 1 MG PO TABS
1.0000 mg | ORAL_TABLET | ORAL | Status: AC | PRN
  Administered 2014-09-16: 1 mg via ORAL

## 2014-09-15 MED ORDER — DIPHENHYDRAMINE HCL 25 MG PO CAPS
ORAL_CAPSULE | ORAL | Status: AC
  Administered 2014-09-15: 50 mg via ORAL
  Filled 2014-09-15: qty 2

## 2014-09-15 MED ORDER — ZIPRASIDONE MESYLATE 20 MG IM SOLR
INTRAMUSCULAR | Status: AC
  Administered 2014-09-15: 20 mg via INTRAMUSCULAR
  Filled 2014-09-15: qty 20

## 2014-09-15 MED ORDER — OLANZAPINE 5 MG PO TBDP
5.0000 mg | ORAL_TABLET | Freq: Once | ORAL | Status: AC
  Administered 2014-09-15: 5 mg via ORAL

## 2014-09-15 MED ORDER — LORAZEPAM 2 MG/ML IJ SOLN
1.0000 mg | Freq: Once | INTRAMUSCULAR | Status: AC
  Administered 2014-09-15: 1 mg via INTRAMUSCULAR

## 2014-09-15 NOTE — [facility] Group Notes (Signed)
[facility] Group Notes:  (Clinical Social Work)  09/15/2014  11:15-12:00PM  Summary of Progress/Problems:   The main focus of today's process group was to discuss patients' feelings related to being hospitalized, as well as their safety, given the events on the unit with 6 police officers called in this morning.    The patient refused to express his primary feeling about being hospitalized, whether he feels the medications are helping him, and more.  He said there was another patient who had been yelling on the unit this morning, did not acknowledge his own outbursts or that the police were actually called due to his own behaviors.  He was subsequently focused on directing CSW as to who he wanted to be asked questions first, second, etc., in group.  He was agitated twhen the questions came to him as the last person, indicating angrily he did not think he was being asked the same things the others had been asked.  He made racial statements, seemed to feel his race may have led CSW to ask him different questions, although he calmed down when other patients pointed out that the questions had not changed.  He later insisted on leaving the room in anger.  Type of Therapy:  Group Therapy - Process  Participation Level:  Minimal  Participation Quality:  Intrusive, Inattentive and Resistant  Affect:  Irritable  Cognitive:  Disorganized and Confused  Insight:  None  Engagement in Therapy:  None  Modes of Intervention:  Exploration, Discussion  Ambrose Mantle, LCSW 09/15/2014, 12:13 PM
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
## 2014-09-15 NOTE — Progress Notes (Signed)
D: [place] is resting in his bed. He has shown no aggression towards staff this evening. Very talkative with word salad. Mumbles, incoherent speech and still with illogical thinking intermittently. He was able to tell me that he went to [place] here in [place] His mood is labile however, he has not been physically or verbally aggressive. Spoke with [place] about where he was from and he spoke enthusiastically about a little of his upbringing. His thoughts are random but he does have small moments of conversation appropriate to cirumstances. He is apologetic about his aggression earlier. Stating she(the nurse) was getting on his nerves and not treating him with respect.  A: [place], [place] for being apologetic. Encouraged him to let's move on from what happened today and to not focus on the negative past.  R: Still under CO with MHT. Will continue to monitor for safety per unit protocol.
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
## 2014-09-15 NOTE — Progress Notes (Signed)
[facility] MD Progress Note  09/15/2014 10:25 AM [place]  MRN:  [id number] Subjective:   You are not my doctor.  Leave me alone.    Objective Patient seen and chart reviewed.  Patient remains very irritable, angry, labile with pressured speech .  He became very upset when he see this Clinical research associate .  He replied that he is seeing every day and new doctor .  He was noticed talking to himself seems like responding to internal stimuli.  As per staff patient has been very agitated, loud with pressured speech and difficult to direct.  Though he is taking the medication but he continued to experience disorganized thinking.  He continues to talk about demons, evil spirits .  As per staff poor sleep and irritability .  Patient was given Zydis 5 mg to calm him down.  His Depakote level is still pending.  He is taking Abilify and reported no side effects.  Interval History 09/12/2014:  [place] is an 49 y.o. male initially presented to ED at the suggestion of his case manager Ms. Ander Slade. Per tele report, patient was having AVH. Pt acknowledges he has AVH, then shortly after denies AVH and reports he is really experiencing battles with demons and the devil. Pt appears to be a poor historian and appears to be influenced by delusions at present. Pt denies use of alcohol or drugs. UDS shows positive for cocaine.   Principal Problem: Schizoaffective disorder, bipolar type Diagnosis:   Patient Active Problem List   Diagnosis Date Noted  . Schizoaffective disorder, bipolar type [F25.0]   . 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 Schizoaffective disorder [F25.9] 03/24/2013  . Cocaine dependence [F14.20] 03/23/2013  . Cannabis dependence [F12.20] 03/23/2013  . Psychotic disorder [F29] 03/22/2013  . Schizophrenia [F20.9] 03/29/2012    Class: Chronic  . Substance abuse [F19.10] 03/29/2012    Class: Chronic  . Non-compliant patient [Z91.19] 03/29/2012    Class: Chronic   Total Time  spent with patient: 25 minutes  Past Medical History:  Past Medical History  Diagnosis Date  . Schizophrenia    History reviewed. No pertinent past surgical history. Family History: History reviewed. No pertinent family history. Social History:  History  Alcohol Use  . 1.2 oz/week  . 1 Glasses of wine, 1 Cans of beer per week     History  Drug Use Not on file    History   Social History  . Marital Status: Single    Spouse Name: N/A  . Number of Children: N/A  . Years of Education: N/A   Social History Main Topics  . Smoking status: Current Every Day Smoker -- 1.50 packs/day for 3 years    Types: Cigarettes  . Smokeless tobacco: Not on file  . Alcohol Use: 1.2 oz/week    1 Glasses of wine, 1 Cans of beer per week  . Drug Use: Not on file  . Sexual Activity: Not Currently   Other Topics Concern  . None   Social History Narrative   Additional History:    Sleep: Fair  Appetite:  Fair   Assessment: See above  Musculoskeletal: Strength & Muscle Tone: within normal limits Gait & Station: normal Patient leans: N/A   Psychiatric Specialty Exam: Physical Exam  Review of Systems  Constitutional: Negative.  Negative for fever, chills, weight loss and malaise/fatigue.  HENT: Negative.  Negative for congestion and sore throat.   Eyes: Negative.  Negative for blurred vision, double vision, discharge and  redness.  Respiratory: Negative.  Negative for cough, shortness of breath and wheezing.   Cardiovascular: Negative.  Negative for chest pain and palpitations.  Gastrointestinal: Negative.  Negative for heartburn, nausea, vomiting, abdominal pain, diarrhea and constipation.  Genitourinary: Negative.   Musculoskeletal: Negative.  Negative for myalgias and falls.  Skin: Negative.  Negative for rash.  Neurological: Negative.  Negative for dizziness, seizures, loss of consciousness and headaches.  Endo/Heme/Allergies: Negative.  Negative for environmental allergies.   Psychiatric/Behavioral: Positive for depression and hallucinations. Negative for suicidal ideas and memory loss. The patient is nervous/anxious. The patient does not have insomnia.   All other systems reviewed and are negative.   Blood pressure 110/77, pulse 82, temperature 99 F (37.2 C), temperature source Oral, resp. rate 18, height 5\' 11"  (1.803 m), weight 109.317 kg (241 lb).Body mass index is 33.63 kg/(m^2).  General Appearance: Bizarre and Disheveled  Eye Contact::  Fair  Speech:  Pressured  Volume:  Increased  Mood:  Anxious and Irritable  Affect:  Appropriate, Inappropriate and Labile  Thought Process:  Circumstantial and Goal Directed  Orientation:  Full (Time, Place, and Person)  Thought Content:  Delusions   Suicidal Thoughts:  No  Homicidal Thoughts:  No  Memory:  Immediate;   Fair Recent;   Fair Remote;   Fair  Judgement:  Fair  Insight:  Fair  Psychomotor Activity:  Increased and Restlessness  Concentration:  Poor  Recall:  Good  Fund of Knowledge:Fair  Language: Fair  Akathisia:  No  Handed:    AIMS (if indicated):     Assets:  Communication Skills Desire for Improvement Resilience Social Support  ADL's:  Intact  Cognition: WNL  Sleep:  Number of Hours: 5     Current Medications: Current Facility-Administered Medications  Medication Dose Route Frequency Provider Last Rate Last Dose  . ARIPiprazole (ABILIFY) tablet 10 mg  10 mg Oral QHS Beau Fanny, FNP   10 mg at 09/14/14 2149  . divalproex (DEPAKOTE) DR tablet 1,000 mg  1,000 mg Oral QHS Beau Fanny, FNP   1,000 mg at 09/14/14 2149  . LORazepam (ATIVAN) tablet 1 mg  1 mg Oral Q8H PRN Craige Cotta, MD   1 mg at 09/14/14 0826  . OLANZapine zydis (ZYPREXA) disintegrating tablet 5 mg  5 mg Oral Once Cleotis Nipper, MD      . traZODone (DESYREL) tablet 100 mg  100 mg Oral QHS Earney Navy, NP   100 mg at 09/14/14 2149  . trihexyphenidyl (ARTANE) tablet 2 mg  2 mg Oral BID WC Craige Cotta, MD   2 mg at 09/15/14 1610    Lab Results: No results found for this or any previous visit (from the past 48 hour(s)).  Physical Findings: AIMS: Facial and Oral Movements Muscles of Facial Expression: None, normal Lips and Perioral Area: None, normal Jaw: None, normal Tongue: None, normal,Extremity Movements Upper (arms, wrists, hands, fingers): None, normal Lower (legs, knees, ankles, toes): None, normal, Trunk Movements Neck, shoulders, hips: None, normal, Overall Severity Severity of abnormal movements (highest score from questions above): None, normal Incapacitation due to abnormal movements: None, normal Patient's awareness of abnormal movements (rate only patient's report): No Awareness, Dental Status Current problems with teeth and/or dentures?: No Does patient usually wear dentures?: No  CIWA:    COWS:     Treatment Plan Summary: Daily contact with patient to assess and evaluate symptoms and progress in treatment and Medication management  Medications: -Increase  Abilify 15 mg for psychosis,  - Continue Depakote  qhs, Depakote level pending  -Continue Trazodone  qhs for insomnia -Continue Artane  bid for EPS prophylaxis -Continue Ativan  PO q8h prn severe agitation If patient do not see improvement with Abilify before switches into psychotic medication to either Seroquel or Zyprexa.  Medical Decision Making:  Established Problem, Stable/Improving (1), Review of Psycho-Social Stressors (1), Review or order clinical lab tests (1), Established Problem, Worsening (2), Review of Medication Regimen & Side Effects (2) and Review of New Medication or Change in Dosage (2)   Gordon Carlson T., MD
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
## 2014-09-16 MED ORDER — OLANZAPINE 5 MG PO TBDP
ORAL_TABLET | ORAL | Status: AC
  Administered 2014-09-16: 5 mg via ORAL
  Filled 2014-09-16: qty 1

## 2014-09-16 MED ORDER — OLANZAPINE 5 MG PO TBDP
5.0000 mg | ORAL_TABLET | Freq: Once | ORAL | Status: AC
  Administered 2014-09-16: 5 mg via ORAL
  Filled 2014-09-16: qty 1

## 2014-09-16 MED ORDER — OLANZAPINE 5 MG PO TBDP
5.0000 mg | ORAL_TABLET | Freq: Three times a day (TID) | ORAL | Status: DC | PRN
  Filled 2014-09-16: qty 1

## 2014-09-16 MED ORDER — LORAZEPAM 1 MG PO TABS
2.0000 mg | ORAL_TABLET | Freq: Once | ORAL | Status: AC
  Administered 2014-09-16: 2 mg via ORAL

## 2014-09-16 NOTE — Progress Notes (Signed)
D: [place] is currently asleep in his bed.  A: [name] is being monitored per close observation protocol by MHT R: Staff will continue to monitor for safety.
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
## 2014-09-16 NOTE — [facility] Group Notes (Signed)
[facility] Group Notes:  (Nursing/MHT/Case Management/Adjunct)  Date:  09/16/2014  Time:  10:32 AM  Type of Therapy:  Psychoeducational Skills  Participation Level:  Active  Participation Quality:  Inattentive  Affect:  Resistant  Cognitive:  Disorganized  Insight:  Lacking  Engagement in Group:  Lacking  Modes of Intervention:  Problem-solving  Summary of Progress/Problems: Pt did attend self inventory group, pt reported that he was negative SI/HI, no AH/VH noted. Pt rated his depression as a 0, and his helplessness/hopelessness as a 0.     Pt reported no issues or concerns. Pt reported that his healthy support system was his grandson.    Jacquelyne Balint Shanta 09/16/2014, 10:32 AM

## 2014-09-16 NOTE — Progress Notes (Signed)
Adult Psychoeducational Group Note  Date:  09/16/2014 Time:  9:12 PM  Group Topic/Focus:  Wrap-Up Group:   The focus of this group is to help patients review their daily goal of treatment and discuss progress on daily workbooks.  Participation Level:  Minimal  Participation Quality:  Intrusive  Affect:  Anxious  Cognitive:  Disorganized  Insight: Lacking  Engagement in Group:  Engaged  Modes of Intervention:  Discussion  Additional Comments: The patient expressed that he had a good day.The patient also is intrusive and has to be redirected several times for behavior in group.  Octavio Manns 09/16/2014, 9:12 PM

## 2014-09-16 NOTE — Progress Notes (Addendum)
Patient ID: [place] Me[place], male   DOB: 1965-09-01, 49 y.o.   MRN: [id number] D  ---  Pt. Denies pain at this time.   He is irritable and suspicious of staff  But remains calm  And non-aggressive.  Pt. Requested and was given PRN for agitation this AM --  Se MAR.   Pt. Is easily irritated  And is monitored closely by staff.  He is not on 1:1 or close observations at this time.  Pt. Agrees to contract for safety and takes his scheduled medications .  Pr. Was reluctant to take  his Artaine this morning , but did after being encouraged to do so.    Pt. Sits in dayroom interacting with select peers.  He attempts to convince a male peer that he is her " baby daddy" .   This  causes confusion for the  Male pt. , which the pt. Appears to enjoy   --- A ---  Support and encouragement provided.  --- R --  Pt. Remain safe at this time

## 2014-09-16 NOTE — Progress Notes (Addendum)
D: Pt has labile affect and mood.  He was agitated at times.  He stated "put me back on CO,  I wanna teach y'all."  Pt has pressured speech and his speech is disorganized and difficult to understand.  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  Pt paced hallway at times, reporting that he was supposed to be getting visitors.  He stated "I volunteer for [employer]."  He was talking about "popping" someone and made hand movements like he was shooting a gun.  He has exhibited poor boundaries with male peer by giving her a hug in the hallway.  He left evening group.  He stated "I don't have no diseases, I ain't gonna give nobody nothing."  Pt reported he had a good visit with his brother and his brother's ex-girlfriend.  He denies SI, denies hallucinations, denies pain.   A: Pt was redirected for poor boundaries with peer.  Pt became more irritable when staff redirected him and he stated "that's my wife.  Don't you hug your wife and kids?"  Staff reinforced appropriate boundaries and although pt was irritable, he cooperated and did not try to touch peer again.  On-call provider was notified that pt was agitated and Ativan 2 mg POX1 and zyprexa zydis 5 mg POX1 was ordered and administered.  Medications administered per order.  Encouraged and supported pt.  Actively listened to pt.   R: Pt was compliant with medications.  He verbally contracted for safety.  Will continue to monitor and assess.

## 2014-09-16 NOTE — Progress Notes (Signed)
Patient ID: Gregory Meza, male   DOB: [birth year], 49 y.o.   MRN: [id number] D  ---  Pt. Began to escalate at aprox. 1400 hrs.  PRN med was given with no effect.   After reasonable time with no change, Dr. was notified and ordered another PRN ( see MAR ).  This med was more effective for the pt.

## 2014-09-16 NOTE — [facility] Group Notes (Signed)
[facility] Group Notes:  (Clinical Social Work)  09/16/2014  [facility] Group Notes:  (Clinical Social Work)  09/16/2014  11:00AM-12:00PM  Summary of Progress/Problems:  The main focus of today's process group was to listen to a variety of genres of music and to identify that different types of music provoke different responses.  The patient then was able to identify personally what was soothing for them, as well as energizing.  The patient participated in the entire group fully, but with bizarre behaviors both in his seat and out of his seat, was able to be redirected however when those behaviors became inappropriate such as grinding/twerking in his seat.  He seemed to respond to internal stimuli for the entirety of group, but also interacted with CSW in particular and some of the group members.  His speech was at times clear, but most of the time was garbled and like another language, completely unintelligible.  He also signed with his hands for most of group.  He repeatedly requested music that could then not be located, and he was able to appropriately handle that disappointment.  Type of Therapy:  Music Therapy   Participation Level:  Active  Participation Quality:  Attentive and Distracting  Affect:  Blunted   Cognitive:  Hallucinating and Disorganized and Delusional  Insight:  Limited  Engagement in Therapy:  None  Modes of Intervention:   Activity, Exploration  Ambrose Mantle, LCSW 09/16/2014

## 2014-09-16 NOTE — Progress Notes (Signed)
D: Ingvald is asleep in his bed being monitored under CO by [facility]. A: Will continue to monitor for safety R: Close observation continues.

## 2014-09-16 NOTE — Progress Notes (Signed)
Patient ID: Gregory Meza, male   DOB: December 19, 1965, 49 y.o.   MRN: [id number] D  ---  Pt. Refused to take his 1700 Hr. Dose of Artane, even with encouragement .   Pt. Stated that we [facility] )  Are trying to " dope me up with to many medicines ".

## 2014-09-17 MED ORDER — OLANZAPINE 5 MG PO TBDP
5.0000 mg | ORAL_TABLET | Freq: Two times a day (BID) | ORAL | Status: DC | PRN
  Administered 2014-09-17 – 2014-09-21 (×3): 5 mg via ORAL
  Filled 2014-09-17 (×3): qty 1

## 2014-09-17 MED ORDER — OLANZAPINE 5 MG PO TBDP
5.0000 mg | ORAL_TABLET | Freq: Every day | ORAL | Status: DC
  Administered 2014-09-17: 5 mg via ORAL
  Filled 2014-09-17 (×2): qty 1

## 2014-09-17 NOTE — [facility] Group Notes (Signed)
[facility] LCSW Aftercare Discharge Planning Group Note   09/17/2014 10:35 AM  Participation Quality:  Active participant  Mood/Affect:  Irritable   Depression Rating:  denies  Anxiety Rating:  denies  Thoughts of Suicide:  No Will you contract for safety?   NA  Current AVH:  No  Plan for Discharge/Comments:  Patient appeared to have a difficult time focusing during group- his comments and interactions were disorganized and scrambled; including comments about his sugars, meds and side conversations about a foreman that looks like Rod, LCSW, and "prozac makes a woman orgasm whenever she yawns". He was calm and engaged, but also became irritable easily when asked about the weekend. He downplayed the events over the weekend that involved [employer] and another patient.    Transportation Means:   Supports:  Liliana Cline
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
## 2014-09-17 NOTE — Plan of Care (Signed)
Problem: Ineffective individual coping Goal: STG: Patient will remain free from self harm Outcome: Progressing Patient remains free from self harm. 15 minute checks continued per protocol for patient safety.   Problem: Alteration in thought process Goal: STG-Patient is able to follow short directions Outcome: Progressing Patient continues to need frequent redirection but is receptive and is able to follow short directions today.

## 2014-09-17 NOTE — Progress Notes (Signed)
Patient ID: Gregory Meza, male   DOB: November 07, 1965, 49 y.o.   MRN: [id number]  D: Patient continued to have pressured speech and tangential in conversation. Has been verbally aggressive toward other patients tonight. Requiring staff redirection. Patient cursing in hallway after verbally altercation with other peer. Calmed down within five minutes of incident but intrusive with other peers.  A: Staff will continue to monitor on q 15 minute checks, follow treatment plan, and give medications as ordered. R: Took HS medication without issue tonight.

## 2014-09-17 NOTE — [facility] Group Notes (Signed)
[facility] LCSW Group Therapy  09/17/2014 3:10 PM  Type of Therapy:  Group Therapy  Participation Level:  Active  Participation Quality:  Attentive and Monopolizing  Affect:  Appropriate and Excited  Cognitive:  Disorganized  Insight:  Monopolizing, Off Topic and Poor  Engagement in Therapy:  Distracting, Engaged, Improving and Off Topic  Modes of Intervention:  Discussion, Education, Exploration and Orientation  Summary of Progress/Problems:  Patient reportedly was given Ativan shortly before group- CSW asked patient if he was feeling mellow to which he responded off topic about marijuana use and then went on a tangent about a variety of things. Patient has poor insight into his potential obstacles and was unable to redirected and remain focused. However, no as intrusive with the meds on board.    Today's Topic: Overcoming Obstacles. Patients identified one short term goal and potential obstacles in reaching this goal. Patients processed barriers involved in overcoming these obstacles. Patients identified steps necessary for overcoming these obstacles and explored motivation (internal and external) for facing these difficulties head on. Today's Topic: Overcoming Obstacles. Patients identified one short term goal and potential obstacles in reaching this goal. Patients processed barriers involved in overcoming these obstacles. Patients identified steps necessary for overcoming these obstacles and explored motivation (internal and external) for facing these difficulties head on.       Liliana Cline 09/17/2014, 3:10 PM

## 2014-09-17 NOTE — Plan of Care (Signed)
Problem: Ineffective individual coping Goal: STG: Patient will remain free from self harm Outcome: Progressing Pt has not harmed himself tonight.  He denies thoughts of self-harm/SI and he verbally contracts for safety.
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
## 2014-09-18 MED ORDER — OLANZAPINE 5 MG PO TBDP
15.0000 mg | ORAL_TABLET | Freq: Every day | ORAL | Status: DC
  Administered 2014-09-18 – 2014-09-19 (×2): 15 mg via ORAL
  Filled 2014-09-18 (×3): qty 1

## 2014-09-18 MED ORDER — ARIPIPRAZOLE 10 MG PO TABS
20.0000 mg | ORAL_TABLET | Freq: Every day | ORAL | Status: DC
  Filled 2014-09-18: qty 2

## 2014-09-18 NOTE — Progress Notes (Signed)
Patient ID: THUNDER BRIDGEWATER, male   DOB: 19-Dec-1965, 49 y.o.   MRN: [id number] D: Client in dayroom most of the shift interacts appropriately, flirtatious at times but can be redirected. Client tangential, moving arms and hands as he speaks. Client reports "doing healthy" "I haven't got a bipolar" A: Writer reviewed medications, administered as prescribed, encouraged group. Client encouraged to verbalized any concerns. Staff will monitor q34min for safety. R: Client is safe on the unit, attended group.

## 2014-09-18 NOTE — Plan of Care (Signed)
Problem: Ineffective individual coping Goal: STG: Patient will remain free from self harm Outcome: Progressing Patient remains free from self harm. 15 minute checks continued per protocol for patient safety.   Problem: Alteration in thought process Goal: LTG-Patient behavior demonstrates decreased signs psychosis (Patient behavior demonstrates decreased signs of psychosis to the point the patient is safe to return home and continue treatment in an outpatient setting.)  Outcome: Not Progressing Patient continues to express delusional statements to RN today including persecutory and often refers to the "devil" and "satan." Goal: STG-Patient is able to discuss thoughts with staff Outcome: Progressing Patient is able to discuss thoughts with staff today in an appropriate manner and has requested to speak with RN twice 1:1 and had a calm conversation.

## 2014-09-18 NOTE — Progress Notes (Signed)
Adult Psychoeducational Group Note  Date:  09/18/2014 Time: 09:15am  Group Topic/Focus:  Recovery Goals:   The focus of this group is to identify appropriate goals for recovery and establish a plan to achieve them.  Participation Level:  Active  Participation Quality:  Attentive, Monopolizing and Redirectable  Affect:  Blunted  Cognitive:  Alert and Disorganized  Insight: Lacking  Engagement in Group:  Distracting, Improving and Off Topic  Modes of Intervention:  Activity, Education, Orientation, Socialization and Support  Additional Comments:  Pt responses disorganized or sexually inappropriate. Pt is redirectable. Pt cooperative and actively participates in group.   Aurora Mask 09/18/2014, 10:13 AM

## 2014-09-18 NOTE — Progress Notes (Signed)
The focus of this group is to help patients review their daily goal of treatment and discuss progress on daily workbooks. Pt attended the evening group session but was unable to respond on-topic to discussion prompts from the Writer. Pt instead rambled across a variety of unrelated topics, often talking over his peers despite attempts at redirection from the Writer. Pt's only additional request from Nursing Staff this evening was for weed, a request he made several times despite staff telling him that would not happen here. Pt appeared animated in group.

## 2014-09-18 NOTE — Progress Notes (Signed)
[facility] MD Progress Note  09/18/2014 11:53 AM Gregory Meza  MRN:  [id number] Subjective:   I'm going to sue you, I need to get out of the hospital  Objective Patient is a 49 year old with schizoaffective disorder, bipolar type. Patient is extremely agitated, requires redirection and is also delusional. Patient states that he's going to sue everyone, he feels he should not have to take medications and should be discharged. Patient seems to be doing poorly this morning, much more agitated than last week and also more psychotic. Patient is willing to take Zyprexa Zydis.  Chart reviewed and patient seems to be worsening in his presentation in regards to his psychosis and agitation.   Principal Problem: Schizoaffective disorder, bipolar type Diagnosis:   Patient Active Problem List   Diagnosis Date Noted  . Schizoaffective disorder, bipolar type [F25.0]   . 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 Schizoaffective disorder [F25.9] 03/24/2013  . Cocaine dependence [F14.20] 03/23/2013  . Cannabis dependence [F12.20] 03/23/2013  . Psychotic disorder [F29] 03/22/2013  . Schizophrenia [F20.9] 03/29/2012    Class: Chronic  . Substance abuse [F19.10] 03/29/2012    Class: Chronic  . Non-compliant patient [Z91.19] 03/29/2012    Class: Chronic   Total Time spent with patient: 25 minutes  Past Medical History:  Past Medical History  Diagnosis Date  . Schizophrenia    History reviewed. No pertinent past surgical history. Family History: History reviewed. No pertinent family history. Social History:  History  Alcohol Use  . 1.2 oz/week  . 1 Glasses of wine, 1 Cans of beer per week     History  Drug Use Not on file    History   Social History  . Marital Status: Single    Spouse Name: N/A  . Number of Children: N/A  . Years of Education: N/A   Social History Main Topics  . Smoking status: Current Every Day Smoker -- 1.50 packs/day for 3 years    Types: Cigarettes   . Smokeless tobacco: Not on file  . Alcohol Use: 1.2 oz/week    1 Glasses of wine, 1 Cans of beer per week  . Drug Use: Not on file  . Sexual Activity: Not Currently   Other Topics Concern  . None   Social History Narrative   Additional History:    Sleep: Fair  Appetite:  Fair   Assessment: See above  Musculoskeletal: Strength & Muscle Tone: within normal limits Gait & Station: normal Patient leans: N/A   Psychiatric Specialty Exam: Physical Exam  Review of Systems  Constitutional: Negative.  Negative for fever, chills, weight loss and malaise/fatigue.  HENT: Negative.  Negative for congestion and sore throat.   Eyes: Negative.  Negative for blurred vision, double vision, discharge and redness.  Respiratory: Negative.  Negative for cough, shortness of breath and wheezing.   Cardiovascular: Negative.  Negative for chest pain and palpitations.  Gastrointestinal: Negative.  Negative for heartburn, nausea, vomiting, abdominal pain, diarrhea and constipation.  Genitourinary: Negative.   Musculoskeletal: Negative.  Negative for myalgias and falls.  Skin: Negative.  Negative for rash.  Neurological: Negative.  Negative for dizziness, seizures, loss of consciousness and headaches.  Endo/Heme/Allergies: Negative.  Negative for environmental allergies.  Psychiatric/Behavioral: Positive for hallucinations. Negative for depression, suicidal ideas and memory loss. The patient is not nervous/anxious and does not have insomnia.        Delusional and agitated  All other systems reviewed and are negative.   Blood pressure 110/77, pulse 82, temperature  99 F (37.2 C), temperature source Oral, resp. rate 18, height  (1.803 m), weight 109.317 kg (241 lb).Body mass index is 33.63 kg/(m^2).  General Appearance: Disheveled  Eye Solicitor::  Fair  Speech:  Garbled  Volume:  Increased  Mood:  Angry and Irritable  Affect:  Inappropriate and Labile  Thought Process:  Circumstantial and  Disorganized  Orientation:  Full (Time, Place, and Person)  Thought Content:  Delusions   Suicidal Thoughts:  No  Homicidal Thoughts:  No  Memory:  Immediate;   Fair Recent;   Fair Remote;   Fair  Judgement:  Fair  Insight:  Fair  Psychomotor Activity:  Restlessness  Concentration:  Poor  Recall:  Good  Fund of Knowledge:Fair  Language: Fair  Akathisia:  No  Handed:    AIMS (if indicated):     Assets:  Communication Skills Desire for Improvement Resilience Social Support  ADL's:  Intact  Cognition: WNL and Impaired,  Moderate  Sleep:  Number of Hours: 5     Current Medications: Current Facility-Administered Medications  Medication Dose Route Frequency Provider Last Rate Last Dose  . divalproex (DEPAKOTE) DR tablet 1,000 mg  1,000 mg Oral QHS Nelly Rout, MD   1,000 mg at 09/17/14 2150  . LORazepam (ATIVAN) tablet 1 mg  1 mg Oral Q8H PRN Craige Cotta, MD   1 mg at 09/17/14 1256  . OLANZapine zydis (ZYPREXA) disintegrating tablet 15 mg  15 mg Oral QHS Nelly Rout, MD      . OLANZapine zydis (ZYPREXA) disintegrating tablet 5 mg  5 mg Oral BID PRN Nelly Rout, MD   5 mg at 09/17/14 1849  . traZODone (DESYREL) tablet 100 mg  100 mg Oral QHS Earney Navy, NP   100 mg at 09/17/14 2151  . trihexyphenidyl (ARTANE) tablet 2 mg  2 mg Oral BID WC Craige Cotta, MD   2 mg at 09/18/14 0745  . ziprasidone (GEODON) injection 20 mg  20 mg Intramuscular Q12H PRN Cleotis Nipper, MD   20 mg at 09/15/14 1305    Lab Results: No results found for this or any previous visit (from the past 48 hour(s)).  Physical Findings: AIMS: Facial and Oral Movements Muscles of Facial Expression: None, normal Lips and Perioral Area: None, normal Jaw: None, normal Tongue: None, normal,Extremity Movements Upper (arms, wrists, hands, fingers): None, normal Lower (legs, knees, ankles, toes): None, normal, Trunk Movements Neck, shoulders, hips: None, normal, Overall Severity Severity of  abnormal movements (highest score from questions above): None, normal Incapacitation due to abnormal movements: None, normal Patient's awareness of abnormal movements (rate only patient's report): No Awareness, Dental Status Current problems with teeth and/or dentures?: No Does patient usually wear dentures?: No  CIWA:    COWS:     Treatment Plan Summary: Daily contact with patient to assess and evaluate symptoms and progress in treatment and Medication management  His Depakote level and comprehensive metabolic panel was not done as patient refused blood draw  Medications: - Discontinue Abilify 15 mg for psychosis as he seems to be no benefit. To start patient on Zyprexa Zydis 15 mg at bedtime. Discussed the risks and benefits with the patient any detectable with this plan. - Continue Depakote  qhs,  Depakote level could not be obtained as patient refuses blood draw -Continue Trazodone  qhs for insomnia -Continue Artane  bid for EPS prophylaxis -Patient is on agitation protocol medication .  We will continue Geodon 20 mg IM and  lorazepam 1 mg IM for severe agitation .  Also patient on Zyprexa Zydis 5 mg twice a day when necessary agitation  Medical Decision Making:  Review of Psycho-Social Stressors (1), Established Problem, Worsening (2), Review of Last Therapy Session (1), Review of Medication Regimen & Side Effects (2) and Review of New Medication or Change in Dosage (2)   Nelly Rout, MD

## 2014-09-18 NOTE — Progress Notes (Signed)
D: Patient is alert and oriented. Pt's mood and affect is anxious, suspicious, and irritable at times. Pt denies SI/HI and AVH. Pt's speech is disorganized, loud, argumentative, and tangential at times, intrusive at times. Pt refused all VS this morning. Pt expresses delusions to RN today including jealousy and at times pt confuses RN as his "lady." Pt rates his depression 1/10, hopelessness 7/10, and anxiety 0/10. Pt C/O agitation this afternoon, which decreased with PRN medication. Pt's agitation has decreased today as compared to yesterday. Pt is attending unit groups today. A: MD Lucianne Muss made aware of pt's refusal. Active listening by RN. Encouragement/Support provided to pt. Medication education reviewed with pt. PRN medication administered for agitation per providers orders (See MAR). Scheduled medications administered per providers orders (See MAR). 15 minute checks continued per protocol for patient safety.  R: Pt needs frequent redirection and is receptive. Pt remains safe.

## 2014-09-18 NOTE — [facility] Group Notes (Signed)
[facility] LCSW Group Therapy  09/18/2014 , 2:32 PM   Type of Therapy:  Group Therapy  Participation Level:  Active  Participation Quality:  Attentive  Affect:  Appropriate  Cognitive:  Alert  Insight:  Improving  Engagement in Therapy:  Engaged  Modes of Intervention:  Discussion, Exploration and Socialization  Summary of Progress/Problems: Today's group focused on the term Diagnosis.  Participants were asked to define the term, and then pronounce whether it is a negative, positive or neutral term.  Immediately started rambling on about needing to be discharged as it his 10th day here, and that's what the sheriff told him. I started by pointing out he has only been here 7 days, but he quickly corrected me by pointing out he was in [facility] ED for 3 days, and became more agitated. I gave my stock response of needing to see the Dr to determine day of d/c, which only served to infuriate him more.  To his credit, he decided this was too much for him to handle, and he decided to leave.  I locked the door behind him, and he tried to get back in several times to no avail until another pt let him in.  He sat quietly for the remaining 5 minutes.  [name] B 09/18/2014 , 2:32 PM

## 2014-09-19 NOTE — Progress Notes (Signed)
Patient ID: Gregory Meza, male   DOB: February 12, 1966, 49 y.o.   MRN: [id number] [facility] MD Progress Note  09/19/2014 1:15 PM Subjective:   I'm doing better, I need to leave  Objective Patient is a 49 year old with schizoaffective disorder, bipolar type. Patient reports that he feels he is doing better needs to leave. Patient then started talking about Donn Pierini and that he was like Donn Pierini. Patient was however redirectable, reports he slept well last night and is okay with his current medication. He has that he wants to help one of the new patients, discussed need for boundaries.  Chart reviewed and patient seems to be doing better today, still delusional, disorganized at times but redirectable. Patient not aggressive this morning  Principal Problem: Schizoaffective disorder, bipolar type Diagnosis:   Patient Active Problem List   Diagnosis Date Noted  . Schizoaffective disorder, bipolar type [F25.0]   . 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 Schizoaffective disorder [F25.9] 03/24/2013  . Cocaine dependence [F14.20] 03/23/2013  . Cannabis dependence [F12.20] 03/23/2013  . Psychotic disorder [F29] 03/22/2013  . Schizophrenia [F20.9] 03/29/2012    Class: Chronic  . Substance abuse [F19.10] 03/29/2012    Class: Chronic  . Non-compliant patient [Z91.19] 03/29/2012    Class: Chronic   Total Time spent with patient: 20 minutes  Past Medical History:  Past Medical History  Diagnosis Date  . Schizophrenia    History reviewed. No pertinent past surgical history. Family History: History reviewed. No pertinent family history. Social History:  History  Alcohol Use  . 1.2 oz/week  . 1 Glasses of wine, 1 Cans of beer per week     History  Drug Use Not on file    History   Social History  . Marital Status: Single    Spouse Name: N/A  . Number of Children: N/A  . Years of Education: N/A   Social History Main Topics  . Smoking status: Current Every  Day Smoker -- 1.50 packs/day for 3 years    Types: Cigarettes  . Smokeless tobacco: Not on file  . Alcohol Use: 1.2 oz/week    1 Glasses of wine, 1 Cans of beer per week  . Drug Use: Not on file  . Sexual Activity: Not Currently   Other Topics Concern  . None   Social History Narrative   Additional History:    Sleep: Fair  Appetite:  Fair   Assessment: See above  Musculoskeletal: Strength & Muscle Tone: within normal limits Gait & Station: normal Patient leans: N/A   Psychiatric Specialty Exam: Physical Exam  Review of Systems  Constitutional: Negative.  Negative for fever, chills, weight loss and malaise/fatigue.  HENT: Negative.  Negative for congestion and sore throat.   Eyes: Negative.  Negative for blurred vision, double vision, discharge and redness.  Respiratory: Negative.  Negative for cough, shortness of breath and wheezing.   Cardiovascular: Negative.  Negative for chest pain and palpitations.  Gastrointestinal: Negative.  Negative for heartburn, nausea, vomiting, abdominal pain, diarrhea and constipation.  Genitourinary: Negative.   Musculoskeletal: Negative.  Negative for myalgias and falls.  Skin: Negative.  Negative for rash.  Neurological: Negative.  Negative for dizziness, seizures, loss of consciousness and headaches.  Endo/Heme/Allergies: Negative.  Negative for environmental allergies.  Psychiatric/Behavioral: Negative for depression, suicidal ideas, hallucinations and memory loss. The patient is not nervous/anxious and does not have insomnia.        Delusional   All other systems reviewed and are negative.  Blood pressure 110/77, pulse 82, temperature 99 F (37.2 C), temperature source Oral, resp. rate 18, height  (1.803 m), weight 109.317 kg (241 lb).Body mass index is 33.63 kg/(m^2).  General Appearance: Disheveled  Eye Solicitor::  Fair  Speech:  Garbled  Volume:  Increased  Mood:  Angry and Irritable  Affect:  Inappropriate and Labile   Thought Process:  Circumstantial and Coherent  Orientation:  Full (Time, Place, and Person)  Thought Content:  Delusions   Suicidal Thoughts:  No  Homicidal Thoughts:  No  Memory:  Immediate;   Fair Recent;   Fair Remote;   Fair  Judgement:  Fair  Insight:  Fair  Psychomotor Activity:  Restlessness  Concentration:  Poor  Recall:  Good  Fund of Knowledge:Fair  Language: Fair  Akathisia:  No  Handed:    AIMS (if indicated):     Assets:  Communication Skills Desire for Improvement Resilience Social Support  ADL's:  Intact  Cognition: WNL and Impaired,  Moderate  Sleep:  Number of Hours: 3.5     Current Medications: Current Facility-Administered Medications  Medication Dose Route Frequency Provider Last Rate Last Dose  . divalproex (DEPAKOTE) DR tablet 1,000 mg  1,000 mg Oral QHS Nelly Rout, MD   1,000 mg at 09/18/14 2124  . LORazepam (ATIVAN) tablet 1 mg  1 mg Oral Q8H PRN Craige Cotta, MD   1 mg at 09/19/14 0754  . OLANZapine zydis (ZYPREXA) disintegrating tablet 15 mg  15 mg Oral QHS Nelly Rout, MD   15 mg at 09/18/14 2124  . OLANZapine zydis (ZYPREXA) disintegrating tablet 5 mg  5 mg Oral BID PRN Nelly Rout, MD   5 mg at 09/17/14 1849  . traZODone (DESYREL) tablet 100 mg  100 mg Oral QHS Earney Navy, NP   100 mg at 09/18/14 2124  . trihexyphenidyl (ARTANE) tablet 2 mg  2 mg Oral BID WC Craige Cotta, MD   2 mg at 09/19/14 0753  . ziprasidone (GEODON) injection 20 mg  20 mg Intramuscular Q12H PRN Cleotis Nipper, MD   20 mg at 09/15/14 1305    Lab Results: No results found for this or any previous visit (from the past 48 hour(s)).  Physical Findings: AIMS: Facial and Oral Movements Muscles of Facial Expression: None, normal Lips and Perioral Area: None, normal Jaw: None, normal Tongue: None, normal,Extremity Movements Upper (arms, wrists, hands, fingers): None, normal Lower (legs, knees, ankles, toes): None, normal, Trunk Movements Neck,  shoulders, hips: None, normal, Overall Severity Severity of abnormal movements (highest score from questions above): None, normal Incapacitation due to abnormal movements: None, normal Patient's awareness of abnormal movements (rate only patient's report): No Awareness, Dental Status Current problems with teeth and/or dentures?: No Does patient usually wear dentures?: No  CIWA:    COWS:     Treatment Plan Summary: Daily contact with patient to assess and evaluate symptoms and progress in treatment and Medication management  Patient's Depakote level and comprehensive metabolic panel was done this morning, results pending Patient to participate in groups and therapeutic milieu  Medications: - Continue Zyprexa Zydis 15 mg at bedtime for psychosis - Continue Depakote  qhs for mood stabilization -Continue Trazodone  qhs for insomnia -Continue Artane  bid for EPS prophylaxis -Patient is continued on agitation protocol  Medical Decision Making:  Established Problem, Stable/Improving (1), Review of Psycho-Social Stressors (1), Review of Last Therapy Session (1) and Review of Medication Regimen & Side Effects (2)  Nelly Rout, MD

## 2014-09-19 NOTE — Progress Notes (Signed)
Patient ID: Gregory Meza, male   DOB: 11-24-1965, 49 y.o.   MRN: [id number]  DAR: Pt. Denies SI/HI and A/V Hallucinations. He continues to refuse staff to take vital signs. And when asked about him getting his labs drawn he stated, "I don't get my labs drawn it's against my religion." However, it is of note that his records display labs that have been drawn in the past. Patient does not report any pain or discomfort at this time. Support and encouragement provided to the patient. Scheduled medications as well as PRN Ativan was administered to patient without difficulty. Patient remains somewhat tangential but appears more calm than writer has observed previously in the week. Patient does remain flirtatious but can be redirected. He is seen in the milieu and is attending some groups. Q15 minute checks are maintained for safety.

## 2014-09-19 NOTE — [facility] Group Notes (Signed)
[facility] Group Therapy  09/19/2014 , 1:48 PM    Type of Therapy:  [employer] Presentation  Participation Level:  Active  Participation Quality:  Attentive  Affect:  Blunted  Cognitive:  Oriented  Insight:  Limited  Engagement in Therapy:  Engaged  Modes of Intervention:  Discussion, Education and Socialization  Summary of Progress/Problems:  Onalee Hua from [employer] came to present his recovery story and play the guitar.  Wandered in and out several times, staying only briefly each time.  Told the presenter he would come back when he started playing guitar.  True to his word, he paid attention from outside of the room, and came back in for the music portion.  [name] B 09/19/2014 , 1:48 PM

## 2014-09-19 NOTE — [facility] Group Notes (Signed)
[facility] LCSW Aftercare Discharge Planning Group Note   09/19/2014 10:43 AM  Participation Quality:  Improving   Mood/Affect:  Appropriate and more calm and cooperative  Depression Rating:    Anxiety Rating:    Thoughts of Suicide:  No Will you contract for safety?   NA  Current AVH:  No   Plan for Discharge/Comments:  Home with follow up outpatient. Cotey appeared more calm and pleasant during group. He wanted to talk about "self awareness" and did ramble a bit about "Donn Pierini" and "I just wanna be me".     Transportation Means: Yes   Supports: formal  Liliana Cline

## 2014-09-19 NOTE — Plan of Care (Signed)
Problem: Alteration in thought process Goal: LTG-Patient is able to perceive the environment accurately Outcome: Progressing Patient appears more grounded in reality however is still progressing towards this goal.

## 2014-09-19 NOTE — Progress Notes (Signed)
D: Gregory Meza has been extremely talkative, restless and anxious today. He has not been verbally or physically aggressive although he has inappropriate with some of his conversation in regards to being sexually explicit and using explicatives. He is observed talking to himself on numerous occasions. Denies AVH or SI/HI.  A: Encouraged Gregory Meza to go to his room and attempt to get some rest and focus on relaxation. R: Patient medicated per orders. Will continue to monitor for safety.

## 2014-09-20 MED ORDER — OLANZAPINE 10 MG PO TBDP
20.0000 mg | ORAL_TABLET | Freq: Every day | ORAL | Status: DC
  Administered 2014-09-20 – 2014-09-24 (×5): 20 mg via ORAL
  Filled 2014-09-20: qty 2
  Filled 2014-09-20: qty 6
  Filled 2014-09-20 (×6): qty 2

## 2014-09-20 NOTE — Tx Team (Signed)
Interdisciplinary Treatment Plan Update (Adult)  Date:  09/20/2014   Time Reviewed:  10:20 AM   Progress in Treatment: Attending groups: Yes. Participating in groups:  Yes. Taking medication as prescribed:  Yes.  Tolerating medication:  Yes. Family/Significant othe contact made:  Yes Patient understands diagnosis:  No  Limited insight Discussing patient identified problems/goals with staff:  Yes, see initial care plan. Medical problems stabilized or resolved:  Yes. Denies suicidal/homicidal ideation: Yes. Issues/concerns per patient self-inventory:  No. Other:  New problem(s) identified:  Discharge Plan or Barriers:  Return home, follow up outpt  Reason for Continuation of Hospitalization: Delusions  Hallucinations Medication stabilization  Mood Lability  Comments: " I apologize for my behavior. I want to discharge today. I have to go to the grocery store."  Yesterday patient became very aggressive and threatening to this Probation officer. He came with aggressive posture and about to hit this Probation officer and staff intervened . He was given medication and placed on close observation. Today patient is more calm and cooperative. He apologizes for his behavior. He is taking his medication however he remains very delusional, psychotic, grandiose and disorganized. His his speech remains very pressured and fast. He continues to believe that he is previously white person and reborn as a black person. He believes he is Jesus. He continues to talk about demons and able to spirits. He does not believe he has any psychiatric illness and he requested to be discharged so he can go to the grocery store. However he was able to follow instruction from the staff. Later he apologizes for his behavior and promised that he will take the medication. His Depakote level is pending. His Abilify was increased 15 mg. Patient is reported no side effects including any tremors or shakes. Patient was seen by another  psychiatrist yesterday to get second opinion if patient needs forced medication due to noncompliance with medication. However so far patient is taking his medication. Abilify, Depakote trial     Geodon and Ativan ordered IM for agitation  09/20/2014 Earlier this week patient changed from Abilify to scheduled Zyprexa, with some success.  While he continues to be pressured, tangential and disorganized, he is able to flash glimpses of goal directed conversation, and his mood is not fluctuating as much.  Asks daily for discharge  Estimated length of stay: 4-5 days  New goal(s):  Review of initial/current patient goals per problem list:   Review of initial/current patient goals per problem list:  1. Goal(s): Patient will participate in aftercare plan   Met: Yes   Target date: 3-5 days post admission date   As evidenced by: Patient will participate within aftercare plan AEB aftercare provider and housing plan at discharge being identified.  09/12/14:  Pt will return home and follow up outpt      5. Goal(s): Patient will demonstrate decreased signs of psychosis  * Met: No  * Target date: 3-5 days post admission date  * As evidenced by: Patient will demonstrate decreased frequency of AVH or return to baseline function  09/12/14: Pt is currently peasantly psychotic and delusional.  Likely has not been taking medication for some time.  Likely substance abuse as well based on past history. 09/17/14: Pt continues disorganized and delusional.  He has also displayed more mood lability, including threatening a Dr and nurse over the weekend, resulting in forced Medication order.   09/20/2014  Continues with disorganization and delusions.  Mood is better, more stable with no anger outbursts.  Attendees: Patient:  09/20/2014 10:20 AM   Family:   09/20/2014 10:20 AM   Physician:  Gregory Abbot, MD 09/20/2014 10:20 AM   Nursing:   Gregory Dawes, RN 09/20/2014 10:20 AM   CSW:    Gregory Lias, LCSW    09/20/2014 10:20 AM   Other:  09/20/2014 10:20 AM   Other:   09/20/2014 10:20 AM   Other:  Gregory Meza, Nurse CM 09/20/2014 10:20 AM   Other:  Gregory Meza, [facility] 09/20/2014 10:20 AM   Other:  Gregory Meza, [place]  09/20/2014 10:20 AM   Other:  09/20/2014 10:20 AM   Other:  09/20/2014 10:20 AM   Other:  09/20/2014 10:20 AM   Other:  09/20/2014 10:20 AM   Other:  09/20/2014 10:20 AM   Other:   09/20/2014 10:20 AM    Scribe for Treatment Team:   Gregory Meza, 09/20/2014 10:20 AM

## 2014-09-20 NOTE — Progress Notes (Signed)
D: Gregory Meza has been cooperative, intrusive and with disorganized thoughts. He attended group this evening for karaoke. Affect remains flat. He continues to have flight of thoughts and poor boundaries with staff and peers. Gregory Meza is able to states one goal for me today "To take all my meds so I can get better". Praised Engineer, structural for stating his goal and meeting it for the day.  A: Gregory Meza took his pm medications as prescribed. We will continue to encourage Gregory Meza through redirection and focusing on goal setting.  R: Continue to monitor patient for his safety and efficacy of medications.

## 2014-09-20 NOTE — [facility] Group Notes (Signed)
[facility] Group Notes:  (Counselor/Nursing/MHT/Case Management/Adjunct)  09/20/2014 1:15PM  Type of Therapy:  Group Therapy  Participation Level:  Active  Participation Quality:  Appropriate  Affect:  Flat  Cognitive:  Oriented  Insight:  Improving  Engagement in Group:  Limited  Engagement in Therapy:  Limited  Modes of Intervention:  Discussion, Exploration and Socialization  Summary of Progress/Problems: The topic for group was balance in life.  Pt participated in the discussion about when their life was in balance and out of balance and how this feels.  Pt discussed ways to get back in balance and short term goals they can work on to get where they want to be. "I'm [name].  I've come a full 180 degrees in my chemical imbalance.  I'm [name], but you can call me Gregory Meza, or Gregory Meza."  And this is only a small of example of the entertainment we had with [name] today.  He and another patient often get into a back and forth that is very animated with raised voices, but stops short of an angry interchange.   [name] B 09/20/2014 4:03 PM

## 2014-09-20 NOTE — Progress Notes (Cosign Needed)
D) Pt has been labile in mood. Affect remains flat. Pt continues to respond, loudly at times, to internal stimuli. Pt can be sexually inappropriate and has poor boundaries with staff and peers. Gregory Meza became upset with MHT for redirecting pt on his boundaries. Pt became very agitated and threatening towards MHT and peers. Pt continues to refuse the Artane. A) Level  obs for safety. Redirection and limit setting. Prompts as needed. Ativan given. R) Labile. Pt was able to calm down after Ativan given.

## 2014-09-20 NOTE — Progress Notes (Signed)
Patient ID: Gregory Meza, male   DOB:[birth year], 49 y.o.   MRN: [id number] [facility] MD Progress Note  09/20/2014 4:13 PM Subjective:   I'm trying to get people to not come to close to me.  Objective Patient is a 49 year old with schizoaffective disorder, bipolar type. Patient reports that he is doing better, adds that he wants to know what to say to people when they get too close to him. Discussed in length personal space with patient, the need to use that when someone comes to close to him and makes him uncomfortable. Patient still continues to have some pressured speech, is difficult to understand at times but is redirectable and seems calmer  Chart reviewed and patient seems to be doing better today, still disorganized at times but redirectable.   Principal Problem: Schizoaffective disorder, bipolar type Diagnosis:   Patient Active Problem List   Diagnosis Date Noted  . Schizoaffective disorder, bipolar type [F25.0]   . 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 Schizoaffective disorder [F25.9] 03/24/2013  . Cocaine dependence [F14.20] 03/23/2013  . Cannabis dependence [F12.20] 03/23/2013  . Psychotic disorder [F29] 03/22/2013  . Schizophrenia [F20.9] 03/29/2012    Class: Chronic  . Substance abuse [F19.10] 03/29/2012    Class: Chronic  . Non-compliant patient [Z91.19] 03/29/2012    Class: Chronic   Total Time spent with patient: 20 minutes  Past Medical History:  Past Medical History  Diagnosis Date  . Schizophrenia    History reviewed. No pertinent past surgical history. Family History: History reviewed. No pertinent family history. Social History:  History  Alcohol Use  . 1.2 oz/week  . 1 Glasses of wine, 1 Cans of beer per week     History  Drug Use Not on file    History   Social History  . Marital Status: Single    Spouse Name: N/A  . Number of Children: N/A  . Years of Education: N/A   Social History Main Topics  . Smoking status: Current  Every Day Smoker -- 1.50 packs/day for 3 years    Types: Cigarettes  . Smokeless tobacco: Not on file  . Alcohol Use: 1.2 oz/week    1 Glasses of wine, 1 Cans of beer per week  . Drug Use: Not on file  . Sexual Activity: Not Currently   Other Topics Concern  . None   Social History Narrative   Additional History:    Sleep: Fair  Appetite:  Fair   Assessment:Patient seems to be slowly improving but continues to be disorganized at times with pressured speech and would benefit from his Zyprexa being increased to 20 mg at bedtime. Discussed  again in length with patient the need for labs and patient refuses   Musculoskeletal: Strength & Muscle Tone: within normal limits Gait & Station: normal Patient leans: N/A   Psychiatric Specialty Exam: Physical Exam  Review of Systems  Constitutional: Negative.  Negative for fever, chills, weight loss and malaise/fatigue.  HENT: Negative.  Negative for congestion and sore throat.   Eyes: Negative.  Negative for blurred vision, double vision, discharge and redness.  Respiratory: Negative.  Negative for cough, shortness of breath and wheezing.   Cardiovascular: Negative.  Negative for chest pain and palpitations.  Gastrointestinal: Negative.  Negative for heartburn, nausea, vomiting, abdominal pain, diarrhea and constipation.  Genitourinary: Negative.  Negative for dysuria.  Musculoskeletal: Negative.  Negative for myalgias and falls.  Skin: Negative.  Negative for rash.  Neurological: Negative.  Negative for dizziness, seizures,  loss of consciousness and headaches.  Endo/Heme/Allergies: Negative.  Negative for environmental allergies.  Psychiatric/Behavioral: Negative for depression, suicidal ideas, hallucinations and memory loss. The patient is not nervous/anxious and does not have insomnia.        Delusional   All other systems reviewed and are negative.   Blood pressure 110/77, pulse 82, temperature 99 F (37.2 C), temperature source  Oral, resp. rate 18, height 5\' 11"  (1.803 m), weight 109.317 kg (241 lb).Body mass index is 33.63 kg/(m^2).  General Appearance: Disheveled  Eye Solicitor::  Fair  Speech:  Garbled  Volume:  Increased  Mood:  Angry and Irritable  Affect:  Inappropriate and Labile  Thought Process:  Coherent, Linear and Logical  Orientation:  Full (Time, Place, and Person)  Thought Content:  Delusions and Rumination   Suicidal Thoughts:  No  Homicidal Thoughts:  No  Memory:  Immediate;   Fair Recent;   Fair Remote;   Fair  Judgement:  Fair  Insight:  Fair  Psychomotor Activity:  Restlessness  Concentration:  Poor  Recall:  Good  Fund of Knowledge:Fair  Language: Fair  Akathisia:  No  Handed:    AIMS (if indicated):     Assets:  Communication Skills Desire for Improvement Resilience Social Support  ADL's:  Intact  Cognition: WNL and Impaired,  Moderate  Sleep:  Number of Hours: 4     Current Medications: Current Facility-Administered Medications  Medication Dose Route Frequency Provider Last Rate Last Dose  . divalproex (DEPAKOTE) DR tablet 1,000 mg  1,000 mg Oral QHS Nelly Rout, MD   1,000 mg at 09/19/14 2133  . LORazepam (ATIVAN) tablet 1 mg  1 mg Oral Q8H PRN Craige Cotta, MD   1 mg at 09/20/14 0747  . OLANZapine zydis (ZYPREXA) disintegrating tablet 20 mg  20 mg Oral QHS Nelly Rout, MD      . OLANZapine zydis (ZYPREXA) disintegrating tablet 5 mg  5 mg Oral BID PRN Nelly Rout, MD   5 mg at 09/17/14 1849  . traZODone (DESYREL) tablet 100 mg  100 mg Oral QHS Earney Navy, NP   100 mg at 09/19/14 2133  . trihexyphenidyl (ARTANE) tablet 2 mg  2 mg Oral BID WC Craige Cotta, MD   2 mg at 09/19/14 1656  . ziprasidone (GEODON) injection 20 mg  20 mg Intramuscular Q12H PRN Cleotis Nipper, MD   20 mg at 09/15/14 1305    Lab Results: No results found for this or any previous visit (from the past 48 hour(s)).  Physical Findings: AIMS: Facial and Oral Movements Muscles of  Facial Expression: None, normal Lips and Perioral Area: None, normal Jaw: None, normal Tongue: None, normal,Extremity Movements Upper (arms, wrists, hands, fingers): None, normal Lower (legs, knees, ankles, toes): None, normal, Trunk Movements Neck, shoulders, hips: None, normal, Overall Severity Severity of abnormal movements (highest score from questions above): None, normal Incapacitation due to abnormal movements: None, normal Patient's awareness of abnormal movements (rate only patient's report): No Awareness, Dental Status Current problems with teeth and/or dentures?: No Does patient usually wear dentures?: No  CIWA:    COWS:     Treatment Plan Summary: Daily contact with patient to assess and evaluate symptoms and progress in treatment and Medication management  -Patient to continue participate in therapeutic milieu -Patient to continue to attend groups  Medications: - Increase Zyprexa Zydis to 20 mg at bedtime for psychosis - Continue Depakote 1000mg  qhs for mood stabilization -Continue Trazodone 100mg  qhs  for insomnia -Continue Artane 2mg  bid for EPS prophylaxis -Patient is continued on agitation protocol     Nelly Rout, MD

## 2014-09-21 MED ORDER — ADULT MULTIVITAMIN W/MINERALS CH
1.0000 | ORAL_TABLET | Freq: Every day | ORAL | Status: DC
  Administered 2014-09-21 – 2014-09-25 (×5): 1 via ORAL
  Filled 2014-09-21 (×5): qty 1
  Filled 2014-09-21: qty 3
  Filled 2014-09-21 (×2): qty 1

## 2014-09-21 NOTE — Progress Notes (Signed)
Pt presents with disorganized speech, labile mood. Amarien stated to Clinical research associate, '' I need my ativan, nerve pill '' He was pleasant while speaking with Clinical research associate stating '' sung guns and roses, but you know that song we will we will clock you! '' His speech is tangential and garbled and difficult to follow at times, but he denies any AH/VH. He states '' that officer brought me that paper but it's time for me to go, i'm willing to take the chance to go out and relapse, it's time for me to go. '' He denies any acute physical concerns. Pt reports eating and sleeping well. Denies any physical complaints. Pt accepted prn medications for agitation from writer this am, (zyprexa and ativan see eMAR) and noted effective results at this time. Will continue to monitor q 15 minutes for safety.

## 2014-09-21 NOTE — [facility] Group Notes (Signed)
[facility] LCSW Group Therapy  09/21/2014  1:05 PM  Type of Therapy:  Group therapy  Participation Level:  Active  Participation Quality:  Attentive  Affect:  Flat  Cognitive:  Oriented  Insight:  Limited  Engagement in Therapy:  Limited  Modes of Intervention:  Discussion, Socialization  Summary of Progress/Problems:  Chaplain was here to lead a group on themes of hope and courage.  Continues to blurt out periodically.  "You have claustrophobia."  "My step son Aneta Mins is a good role model.  I'm hopeful for me and my family."  Ida Rogue 09/21/2014 1:35 PM

## 2014-09-21 NOTE — [facility] Group Notes (Signed)
[facility] LCSW Aftercare Discharge Planning Group Note   09/21/2014 11:46 AM  Participation Quality:  Engaged in a psychotic kind of way  Mood/Affect:  Excited  Depression Rating:  denies  Anxiety Rating:  denies  Thoughts of Suicide:  No Will you contract for safety?   NA  Current AVH:  denies  Plan for Discharge/Comments:  "I have a dog Buster and a dog Bullet.  I want a Suzzanne Cloud and that is a man's name, too."   Just a sampling of the run on thoughts and speech of Gregory Meza, who continues to be pleasantly psychotic.  Began speaking in "tongues" at one point, and can be difficult to redirect, but when ignored he eventually winds down.  Did not demand d/c today.  Transportation Means:   Supports:  [name] B

## 2014-09-21 NOTE — Progress Notes (Signed)
Patient ID: Gregory Meza, male   DOB: 04-16-1965, 49 y.o.   MRN: [id number] [facility] MD Progress Note  09/21/2014 3:56 PM Subjective:   "I'm ready to go soon"  Objective Patient is a 49 year old with schizoaffective disorder, bipolar type. Patient reports that he is doing better, and has been calmer on unit. Interactive with peers, and more friendly with staff. Patient still continues to have some pressured speech, is difficult to understand at times but is redirectable. Sleep/appetite good. Tolerating meds well.   Chart reviewed and patient seems to be doing better today, still disorganized at times but redirectable.   Principal Problem: Schizoaffective disorder, bipolar type Diagnosis:   Patient Active Problem List   Diagnosis Date Noted  . Schizoaffective disorder, bipolar type [F25.0]   . 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 Schizoaffective disorder [F25.9] 03/24/2013  . Cocaine dependence [F14.20] 03/23/2013  . Cannabis dependence [F12.20] 03/23/2013  . Psychotic disorder [F29] 03/22/2013  . Schizophrenia [F20.9] 03/29/2012    Class: Chronic  . Substance abuse [F19.10] 03/29/2012    Class: Chronic  . Non-compliant patient [Z91.19] 03/29/2012    Class: Chronic   Total Time spent with patient: 20 minutes  Past Medical History:  Past Medical History  Diagnosis Date  . Schizophrenia    History reviewed. No pertinent past surgical history. Family History: History reviewed. No pertinent family history. Social History:  History  Alcohol Use  . 1.2 oz/week  . 1 Glasses of wine, 1 Cans of beer per week     History  Drug Use Not on file    History   Social History  . Marital Status: Single    Spouse Name: N/A  . Number of Children: N/A  . Years of Education: N/A   Social History Main Topics  . Smoking status: Current Every Day Smoker -- 1.50 packs/day for 3 years    Types: Cigarettes  . Smokeless tobacco: Not on file  . Alcohol Use: 1.2 oz/week     1 Glasses of wine, 1 Cans of beer per week  . Drug Use: Not on file  . Sexual Activity: Not Currently   Other Topics Concern  . None   Social History Narrative   Additional History:    Sleep: Fair  Appetite:  Fair   Assessment:Patient seems to be slowly improving but continues to be disorganized at times with pressured speech. Discussed again in length with patient the need for labs and patient refuses.   Musculoskeletal: Strength & Muscle Tone: within normal limits Gait & Station: normal Patient leans: N/A   Psychiatric Specialty Exam: Physical Exam  Review of Systems  Constitutional: Negative.  Negative for fever, chills, weight loss and malaise/fatigue.  HENT: Negative.  Negative for congestion and sore throat.   Eyes: Negative.  Negative for blurred vision, double vision, discharge and redness.  Respiratory: Negative.  Negative for cough, shortness of breath and wheezing.   Cardiovascular: Negative.  Negative for chest pain and palpitations.  Gastrointestinal: Negative.  Negative for heartburn, nausea, vomiting, abdominal pain, diarrhea and constipation.  Genitourinary: Negative.  Negative for dysuria.  Musculoskeletal: Negative.  Negative for myalgias and falls.  Skin: Negative.  Negative for rash.  Neurological: Negative.  Negative for dizziness, seizures, loss of consciousness and headaches.  Endo/Heme/Allergies: Negative.  Negative for environmental allergies.  Psychiatric/Behavioral: Negative for depression, suicidal ideas, hallucinations and memory loss. The patient is not nervous/anxious and does not have insomnia.        Delusional   All  other systems reviewed and are negative.   Blood pressure 110/77, pulse 82, temperature 99 F (37.2 C), temperature source Oral, resp. rate 18, height  (1.803 m), weight 109.317 kg (241 lb).Body mass index is 33.63 kg/(m^2).  General Appearance: Disheveled  Eye Contact::  Fair  Speech:  Garbled  Volume:  Increased   Mood:  Euphoric  Affect:  Labile  Thought Process:  Tangential  Orientation:  Full (Time, Place, and Person)  Thought Content:  Rumination   Suicidal Thoughts:  No  Homicidal Thoughts:  No  Memory:  Immediate;   Fair Recent;   Fair Remote;   Fair  Judgement:  Fair  Insight:  Fair  Psychomotor Activity:  Restlessness  Concentration:  Poor  Recall:  Good  Fund of Knowledge:Fair  Language: Fair  Akathisia:  No  Handed:    AIMS (if indicated):     Assets:  Communication Skills Desire for Improvement Resilience Social Support  ADL's:  Intact  Cognition: WNL and Impaired,  Moderate  Sleep:  Number of Hours: 5.5     Current Medications: Current Facility-Administered Medications  Medication Dose Route Frequency Provider Last Rate Last Dose  . divalproex (DEPAKOTE) DR tablet 1,000 mg  1,000 mg Oral QHS Nelly Rout, MD   1,000 mg at 09/20/14 2156  . LORazepam (ATIVAN) tablet 1 mg  1 mg Oral Q8H PRN Craige Cotta, MD   1 mg at 09/21/14 1556  . OLANZapine zydis (ZYPREXA) disintegrating tablet 20 mg  20 mg Oral QHS Nelly Rout, MD   20 mg at 09/20/14 2156  . OLANZapine zydis (ZYPREXA) disintegrating tablet 5 mg  5 mg Oral BID PRN Nelly Rout, MD   5 mg at 09/21/14 0728  . traZODone (DESYREL) tablet 100 mg  100 mg Oral QHS Earney Navy, NP   100 mg at 09/20/14 2157  . trihexyphenidyl (ARTANE) tablet 2 mg  2 mg Oral BID WC Craige Cotta, MD   2 mg at 09/21/14 0729  . ziprasidone (GEODON) injection 20 mg  20 mg Intramuscular Q12H PRN Cleotis Nipper, MD   20 mg at 09/15/14 1305    Lab Results: No results found for this or any previous visit (from the past 48 hour(s)).  Physical Findings: AIMS: Facial and Oral Movements Muscles of Facial Expression: None, normal Lips and Perioral Area: None, normal Jaw: None, normal Tongue: None, normal,Extremity Movements Upper (arms, wrists, hands, fingers): None, normal Lower (legs, knees, ankles, toes): None, normal, Trunk  Movements Neck, shoulders, hips: None, normal, Overall Severity Severity of abnormal movements (highest score from questions above): None, normal Incapacitation due to abnormal movements: None, normal Patient's awareness of abnormal movements (rate only patient's report): No Awareness, Dental Status Current problems with teeth and/or dentures?: No Does patient usually wear dentures?: No  CIWA:    COWS:     Treatment Plan Summary: Daily contact with patient to assess and evaluate symptoms and progress in treatment and Medication management  -Patient to continue participate in therapeutic milieu -Patient to continue to attend groups  Medications: - Continue Zyprexa Zydis to 20 mg at bedtime for psychosis, since tolerating and effective - Continue Depakote  qhs for mood stabilization -Continue Trazodone  qhs for insomnia -Continue Artane  bid for EPS prophylaxis -Patient is continued on agitation protocol Consider discharge early next week, if pt's mood/psychosis continue to improve.     Ancil Linsey, MD

## 2014-09-21 NOTE — Progress Notes (Addendum)
Patient did not attend group tonight. He remained in bed awake.

## 2014-09-21 NOTE — Progress Notes (Signed)
Pt agitated, pacing halls. He is becoming more aggressive verbally , threatening to beat up another patient '' that is messing with my blonde lady '' the other RN on the hall. Prn ativan was given earlier with no effect. Pt offered prn zyprexa from another RN and pt refused, although he did accept from Clinical research associate with good encouragement. He stated ''I'm gonna protect cassie and shoot a mother down like the holy ghost, ''

## 2014-09-22 MED ORDER — MENTHOL 3 MG MT LOZG: 1.0000 | LOZENGE | OROMUCOSAL | Status: DC | PRN

## 2014-09-22 NOTE — Progress Notes (Signed)
D: Patient presents with disorganized thought process. He denies SI/HI. He denies pain. Pt presents with paranoia. Patient compliant with medication regimen this morning. Knowledgable of prescribed medication name and action. Grandiose thought content. Intrusive, difficult to redirect. Disruptive during nursing group on the unit. Speech pressured and garbled, easily angered.  A:Special checks q 15 mins in place for safety. Medication administered per MD order(see eMAR). Encouragement, support, and redirection provided.   R:Safety maintained . Will continue to monitor.

## 2014-09-22 NOTE — Progress Notes (Signed)
D: Pt who at this time continues to be confused is also delusional; he accuses different staffs of doing things to him years ago. Pt could also be intrusive; standing close to the nurse during conversation with another Pts. Pt also appeared to be a little restless-can be seen pacing back and forth between hallway, room and dayroom. Pt however continues to deny Pain/SI/HI, depression and anxiety. Pt however continues to be nonviolent.   A: Medications administered as prescribed.  Support, encouragement, and safe environment provided.  15-minute safety checks continue. R: Pt was med compliant.  Safety checks continue.

## 2014-09-22 NOTE — [facility] Group Notes (Addendum)
[facility] LCSW Group Therapy    Description of Group:   Learn how to identify obstacles, self-sabotaging and enabling behaviors, what are they, why do we do them and what needs do these behaviors meet? Discuss unhealthy relationships and how to have positive healthy boundaries with those that sabotage and enable. Explore aspects of self-sabotage and enabling in yourself and how to limit these self-destructive behaviors in everyday life.  Type of Therapy:  Group Therapy: Avoiding Self-Sabotaging and Enabling Behaviors  Participation Level:  Active  Participation Quality:  Intrusive and Monopolizing  Affect:  Excited     Therapeutic Goals: 1. Patient will identify one obstacle that relates to self-sabotage and enabling behaviors 2. Patient will identify one personal self-sabotaging or enabling behavior they did prior to admission 3. Patient able to establish a plan to change the above identified behavior they did prior to admission:  4. Patient will demonstrate ability to communicate their needs through discussion and/or role plays.   Summary of Patient Progress:  Patient very verbal during group w comments that tangentially related to topic under discussion, identified issues w letting other be destructive in his house as an example of self-sabotage, often had comments on topic under discussion and was redirected with some difficulty when appropriate.     Therapeutic Modalities:   Cognitive Behavioral Therapy Person-Centered Therapy Motivational Interviewing  [place], LCSW Clinical Social Worker

## 2014-09-22 NOTE — [facility] Group Notes (Signed)
[facility] Group Notes:  (Nursing/MHT/Case Management/Adjunct)  Date:  09/22/2014  Time:  0930 Type of Therapy:  Nurse Education  Participation Level:  Active  Participation Quality:  Intrusive  Affect:  Excited  Cognitive:  Alert and Disorganized  Insight:  Limited  Engagement in Group:  Distracting and Limited  Modes of Intervention:  Discussion and Socialization  Summary of Progress/Problems:  Dara Hoyer 09/22/2014, 2:31 PM

## 2014-09-22 NOTE — Progress Notes (Signed)
Patient ID: Gregory Meza, male   DOB: 1965-08-04, 49 y.o.   MRN: [id number] [facility] MD Progress Note  09/22/2014 11:28 AM Subjective:   "I hope I can go home today or tomorrow."  Objective Patient is a 49 year old with schizoaffective disorder, bipolar type. Pt seen and chart reviewed. Pt reports that he feels he is stable to go home and has been for awhile. Pt known to this provider x 10 years from many times when he was incarcerated and good rapport has been established. Pt denies suicidal/homicidal ideation and psychosis. However, he does have intermittently pressured speech. Pt has had this for the entire 10 years I have been familiar with him. He is also intrusive and interrupting others. He appears to be at or near baseline which I have observed in various settings and he presents as more stable than the many times I have encountered him. Given his current presentation and history, he may not improve much beyond this point due to chronic history of severe crack-cocaine abuse.   Principal Problem: Schizoaffective disorder, bipolar type Diagnosis:   Patient Active Problem List   Diagnosis Date Noted  . Schizoaffective disorder, bipolar type [F25.0]   . 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 Schizoaffective disorder [F25.9] 03/24/2013  . Cocaine dependence [F14.20] 03/23/2013  . Cannabis dependence [F12.20] 03/23/2013  . Psychotic disorder [F29] 03/22/2013  . Schizophrenia [F20.9] 03/29/2012    Class: Chronic  . Substance abuse [F19.10] 03/29/2012    Class: Chronic  . Non-compliant patient [Z91.19] 03/29/2012    Class: Chronic   Total Time spent with patient: 15 minutes  Past Medical History:  Past Medical History  Diagnosis Date  . Schizophrenia    History reviewed. No pertinent past surgical history. Family History: History reviewed. No pertinent family history. Social History:  History  Alcohol Use  . 1.2 oz/week  . 1 Glasses of wine, 1 Cans of beer per  week     History  Drug Use Not on file    History   Social History  . Marital Status: Single    Spouse Name: N/A  . Number of Children: N/A  . Years of Education: N/A   Social History Main Topics  . Smoking status: Current Every Day Smoker -- 1.50 packs/day for 3 years    Types: Cigarettes  . Smokeless tobacco: Not on file  . Alcohol Use: 1.2 oz/week    1 Glasses of wine, 1 Cans of beer per week  . Drug Use: Not on file  . Sexual Activity: Not Currently   Other Topics Concern  . None   Social History Narrative   Additional History:    Sleep: Good  Appetite:  Good  Assessment: See above  Musculoskeletal: Strength & Muscle Tone: within normal limits Gait & Station: normal Patient leans: N/A  Psychiatric Specialty Exam: Physical Exam  Review of Systems  Constitutional: Negative.  Negative for fever, chills, weight loss and malaise/fatigue.  HENT: Negative.  Negative for congestion and sore throat.   Eyes: Negative.  Negative for blurred vision, double vision, discharge and redness.  Respiratory: Negative.  Negative for cough, shortness of breath and wheezing.   Cardiovascular: Negative.  Negative for chest pain and palpitations.  Gastrointestinal: Negative.  Negative for heartburn, nausea, vomiting, abdominal pain, diarrhea and constipation.  Genitourinary: Negative.  Negative for dysuria.  Musculoskeletal: Negative.  Negative for myalgias and falls.  Skin: Negative.  Negative for rash.  Neurological: Negative.  Negative for dizziness, seizures, loss of consciousness  and headaches.  Endo/Heme/Allergies: Negative.  Negative for environmental allergies.  Psychiatric/Behavioral: Negative for depression, suicidal ideas, hallucinations and memory loss. The patient is not nervous/anxious and does not have insomnia.        Delusional   All other systems reviewed and are negative.   Blood pressure 110/77, pulse 82, temperature 99 F (37.2 C), temperature source Oral,  resp. rate 18, height 5\' 11"  (1.803 m), weight 109.317 kg (241 lb).Body mass index is 33.63 kg/(m^2).  General Appearance: Disheveled  Eye Contact::  Fair  Speech:  Garbled  Volume:  Increased  Mood:  Euphoric  Affect:  Labile  Thought Process:  Tangential  Orientation:  Full (Time, Place, and Person)  Thought Content:  Rumination   Suicidal Thoughts:  No  Homicidal Thoughts:  No  Memory:  Immediate;   Fair Recent;   Fair Remote;   Fair  Judgement:  Fair  Insight:  Fair  Psychomotor Activity:  Restlessness  Concentration:  Poor  Recall:  Good  Fund of Knowledge:Fair  Language: Fair  Akathisia:  No  Handed:    AIMS (if indicated):     Assets:  Communication Skills Desire for Improvement Resilience Social Support  ADL's:  Intact  Cognition: WNL and Impaired,  Moderate  Sleep:  Number of Hours: 5.5     Current Medications: Current Facility-Administered Medications  Medication Dose Route Frequency Provider Last Rate Last Dose  . divalproex (DEPAKOTE) DR tablet 1,000 mg  1,000 mg Oral QHS Nelly Rout, MD   1,000 mg at 09/21/14 2139  . LORazepam (ATIVAN) tablet 1 mg  1 mg Oral Q8H PRN Craige Cotta, MD   1 mg at 09/22/14 0803  . menthol-cetylpyridinium (CEPACOL) lozenge 3 mg  1 lozenge Oral PRN Beau Fanny, FNP      . multivitamin with minerals tablet 1 tablet  1 tablet Oral Daily Caprice Kluver, MD   1 tablet at 09/22/14 0802  . OLANZapine zydis (ZYPREXA) disintegrating tablet 20 mg  20 mg Oral QHS Nelly Rout, MD   20 mg at 09/21/14 2138  . OLANZapine zydis (ZYPREXA) disintegrating tablet 5 mg  5 mg Oral BID PRN Nelly Rout, MD   5 mg at 09/21/14 1701  . traZODone (DESYREL) tablet 100 mg  100 mg Oral QHS Earney Navy, NP   100 mg at 09/21/14 2139  . trihexyphenidyl (ARTANE) tablet 2 mg  2 mg Oral BID WC Craige Cotta, MD   2 mg at 09/22/14 0802  . ziprasidone (GEODON) injection 20 mg  20 mg Intramuscular Q12H PRN Cleotis Nipper, MD   20 mg at 09/15/14  1305    Lab Results: No results found for this or any previous visit (from the past 48 hour(s)).  Physical Findings: AIMS: Facial and Oral Movements Muscles of Facial Expression: None, normal Lips and Perioral Area: None, normal Jaw: None, normal Tongue: None, normal,Extremity Movements Upper (arms, wrists, hands, fingers): None, normal Lower (legs, knees, ankles, toes): None, normal, Trunk Movements Neck, shoulders, hips: None, normal, Overall Severity Severity of abnormal movements (highest score from questions above): None, normal Incapacitation due to abnormal movements: None, normal Patient's awareness of abnormal movements (rate only patient's report): No Awareness, Dental Status Current problems with teeth and/or dentures?: No Does patient usually wear dentures?: No  CIWA:    COWS:     Treatment Plan Summary: Daily contact with patient to assess and evaluate symptoms and progress in treatment and Medication management  -Patient to continue  participate in therapeutic milieu -Patient to continue to attend groups  Medications: - Continue Zyprexa Zydis to 20 mg at bedtime for psychosis, since tolerating and effective - Continue Depakote 1000mg  qhs for mood stabilization -Continue Trazodone 100mg  qhs for insomnia -Continue Artane 2mg  bid for EPS prophylaxis -Patient is continued on agitation protocol Consider discharge early next week, if pt's mood/psychosis continue to improve.  Beau Fanny, [place] 09/22/2014     11:28 AM   Patient seen face-to-face for psychiatric evaluation, chart reviewed and case discussed with the physician extender and developed treatment plan. Reviewed the information documented and agree with the treatment plan. Thedore Mins, MD

## 2014-09-22 NOTE — Progress Notes (Addendum)
D: Pt who is at this time is somewhat confused with some paranoia-he looked at him medications for a while and asked if a new medication was added to his existing ones. Pt could also be intrusive; standing close to the nurse during conversation with another. Pt also appeared to be a little restless-going from room to dayroom and back to room a few times. Pt however denies Pain/SI/HI, denies depression and anxiety. Pt continue to be nonviolent.  A: Medications administered as prescribed.  Support, encouragement, and safe environment provided.  15-minute safety checks continues. R: Pt was med compliant.  Safety checks continues.

## 2014-09-23 MED ORDER — IBUPROFEN 600 MG PO TABS
600.0000 mg | ORAL_TABLET | Freq: Four times a day (QID) | ORAL | Status: DC | PRN
  Administered 2014-09-23: 600 mg via ORAL

## 2014-09-23 MED ORDER — IBUPROFEN 600 MG PO TABS
ORAL_TABLET | ORAL | Status: AC
  Filled 2014-09-23: qty 1

## 2014-09-23 NOTE — Progress Notes (Signed)
D: Pt presents anxious on approach. Pt continues to be paranoid and suspicious. Due to pt increased paranoia, Pt refuses to allow staff to assess v/s and refuses to fill out self inventory sheet. Stating, "that information you can used against me". Pt denies suicidal/homicidal thoughts. Pt denies AVH. Pt speech is tangential and garbled. Pt thoughts disorganized at times throughout the morning. Pt intrusive and easily agitated at times. Pt sexually inappropriate towards staff. Pt requires redirecting by staff throughout the morning. Pt compliant with taking meds this morning. A: Medications administered as ordered per MD. Verbal support given. Pt encouraged to attend groups. 15 minute checks performed for safety. R: Pt inconsistent with following tx plan.

## 2014-09-23 NOTE — [facility] Group Notes (Signed)
[facility] LCSW Group Therapy  09/23/2014   11:00 AM   Type of Therapy:  Group Therapy  Participation Level:  Active  Participation Quality:  Monopolizing  Affect:  Appropriate  Cognitive:  Alert and Appropriate  Insight:  Developing/Improving and Engaged  Engagement in Therapy:  Developing/Improving and Engaged  Modes of Intervention:  Clarification, Confrontation, Discussion, Education, Exploration, Limit-setting, Orientation, Problem-solving, Rapport Building, Dance movement psychotherapist, Socialization and Support  Summary of Progress/Problems: Today's group topic was avoiding self sabotage and enabling behaviors. Group members were asked to define self sabotage and enabling and provide examples. Group members were then asked to discuss unhealthy relationships and how to have positive healthy boundaries with those that enable. Group members were asked to process how communicating needs and establishing a plan to change the above identified behavior.  Pt monopolized group, talking about off top discussion, interrupting a peer when she was talking.  Peer shared, respectfully, that this offended her, why it was offensive and that she was going to leave group to avoid conflict.  Pt handled this conflict well in not engaging.    Reyes Ivan, LCSW 09/23/2014 12:47 PM

## 2014-09-23 NOTE — Progress Notes (Signed)
Patient ID: Gregory Meza, male   DOB: Oct 16, 1965, 49 y.o.   MRN: [id number] [facility] MD Progress Note  09/23/2014 5:09 PM Subjective:   "I am doing really well. They said I just have to keep taking my medicine."   Objective:  Patient is a 49 year old with schizoaffective disorder, bipolar type. Pt seen and chart reviewed. Pt continues to report that he is ready to discharge home. Pt appears to be at or better than his typical baseline from knowing him for 10 years. He denies suicidal/homicidal ideation and psychosis. However, his baseline is mild pressured speech, tangentiality, and mannerisms. He does not appear to be responding to internal stimuli and is able to have a linear conversation when redirected to do so. Pt may be stable to discharge soon. Given his current presentation and history, he may not improve much beyond this point due to chronic history of severe crack-cocaine abuse.   Principal Problem: Schizoaffective disorder, bipolar type  Diagnosis:   Patient Active Problem List   Diagnosis Date Noted  . Schizoaffective disorder, bipolar type [F25.0]   . 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 Schizoaffective disorder [F25.9] 03/24/2013  . Cocaine dependence [F14.20] 03/23/2013  . Cannabis dependence [F12.20] 03/23/2013  . Psychotic disorder [F29] 03/22/2013  . Schizophrenia [F20.9] 03/29/2012    Class: Chronic  . Substance abuse [F19.10] 03/29/2012    Class: Chronic  . Non-compliant patient [Z91.19] 03/29/2012    Class: Chronic   Total Time spent with patient: 15 minutes  Past Medical History:  Past Medical History  Diagnosis Date  . Schizophrenia    History reviewed. No pertinent past surgical history. Family History: History reviewed. No pertinent family history. Social History:  History  Alcohol Use  . 1.2 oz/week  . 1 Glasses of wine, 1 Cans of beer per week     History  Drug Use Not on file    History   Social History  . Marital Status:  Single    Spouse Name: N/A  . Number of Children: N/A  . Years of Education: N/A   Social History Main Topics  . Smoking status: Current Every Day Smoker -- 1.50 packs/day for 3 years    Types: Cigarettes  . Smokeless tobacco: Not on file  . Alcohol Use: 1.2 oz/week    1 Glasses of wine, 1 Cans of beer per week  . Drug Use: Not on file  . Sexual Activity: Not Currently   Other Topics Concern  . None   Social History Narrative   Additional History:    Sleep: Good  Appetite:  Good  Assessment: See above  Musculoskeletal: Strength & Muscle Tone: within normal limits Gait & Station: normal Patient leans: N/A  Psychiatric Specialty Exam: Physical Exam  Review of Systems  Constitutional: Negative.  Negative for fever, chills, weight loss and malaise/fatigue.  HENT: Negative.  Negative for congestion and sore throat.   Eyes: Negative.  Negative for blurred vision, double vision, discharge and redness.  Respiratory: Negative.  Negative for cough, shortness of breath and wheezing.   Cardiovascular: Negative.  Negative for chest pain and palpitations.  Gastrointestinal: Negative.  Negative for heartburn, nausea, vomiting, abdominal pain, diarrhea and constipation.  Genitourinary: Negative.  Negative for dysuria.  Musculoskeletal: Negative.  Negative for myalgias and falls.  Skin: Negative.  Negative for rash.  Neurological: Negative.  Negative for dizziness, seizures, loss of consciousness and headaches.  Endo/Heme/Allergies: Negative.  Negative for environmental allergies.  Psychiatric/Behavioral: Negative for depression, suicidal ideas, hallucinations  and memory loss. The patient is not nervous/anxious and does not have insomnia.        Delusional   All other systems reviewed and are negative.   Blood pressure 110/77, pulse 82, temperature 99 F (37.2 C), temperature source Oral, resp. rate 18, height  (1.803 m), weight 109.317 kg (241 lb).Body mass index is 33.63  kg/(m^2).  General Appearance: Disheveled  Eye Contact::  Fair  Speech:  Garbled  Volume:  Increased  Mood:  Euphoric  Affect:  Labile  Thought Process:  Tangential  Orientation:  Full (Time, Place, and Person)  Thought Content:  Rumination   Suicidal Thoughts:  No  Homicidal Thoughts:  No  Memory:  Immediate;   Fair Recent;   Fair Remote;   Fair  Judgement:  Fair  Insight:  Fair  Psychomotor Activity:  Restlessness  Concentration:  Poor  Recall:  Good  Fund of Knowledge:Fair  Language: Fair  Akathisia:  No  Handed:    AIMS (if indicated):     Assets:  Communication Skills Desire for Improvement Resilience Social Support  ADL's:  Intact  Cognition: WNL and Impaired,  Moderate  Sleep:  Number of Hours: 5.5     Current Medications: Current Facility-Administered Medications  Medication Dose Route Frequency Provider Last Rate Last Dose  . divalproex (DEPAKOTE) DR tablet 1,000 mg  1,000 mg Oral QHS Nelly Rout, MD   1,000 mg at 09/22/14 2111  . LORazepam (ATIVAN) tablet 1 mg  1 mg Oral Q8H PRN Craige Cotta, MD   1 mg at 09/23/14 1643  . menthol-cetylpyridinium (CEPACOL) lozenge 3 mg  1 lozenge Oral PRN Beau Fanny, FNP      . multivitamin with minerals tablet 1 tablet  1 tablet Oral Daily Caprice Kluver, MD   1 tablet at 09/23/14 0759  . OLANZapine zydis (ZYPREXA) disintegrating tablet 20 mg  20 mg Oral QHS Nelly Rout, MD   20 mg at 09/22/14 2111  . OLANZapine zydis (ZYPREXA) disintegrating tablet 5 mg  5 mg Oral BID PRN Nelly Rout, MD   5 mg at 09/21/14 1701  . traZODone (DESYREL) tablet 100 mg  100 mg Oral QHS Earney Navy, NP   100 mg at 09/22/14 2111  . trihexyphenidyl (ARTANE) tablet 2 mg  2 mg Oral BID WC Craige Cotta, MD   2 mg at 09/23/14 1643  . ziprasidone (GEODON) injection 20 mg  20 mg Intramuscular Q12H PRN Cleotis Nipper, MD   20 mg at 09/15/14 1305    Lab Results: No results found for this or any previous visit (from the past 48  hour(s)).  Physical Findings: AIMS: Facial and Oral Movements Muscles of Facial Expression: None, normal Lips and Perioral Area: None, normal Jaw: None, normal Tongue: None, normal,Extremity Movements Upper (arms, wrists, hands, fingers): None, normal Lower (legs, knees, ankles, toes): None, normal, Trunk Movements Neck, shoulders, hips: None, normal, Overall Severity Severity of abnormal movements (highest score from questions above): None, normal Incapacitation due to abnormal movements: None, normal Patient's awareness of abnormal movements (rate only patient's report): No Awareness, Dental Status Current problems with teeth and/or dentures?: No Does patient usually wear dentures?: No  CIWA:    COWS:     *I have reviewed treatment plan on 09/23/14 and will continue as follows:   Treatment Plan Summary: Daily contact with patient to assess and evaluate symptoms and progress in treatment and Medication management  -Patient to continue participate in therapeutic milieu  -  Patient to continue to attend groups   Medications: - Continue Zyprexa Zydis to 20 mg at bedtime for psychosis, since tolerating and effective  - Continue Depakote 1000mg  qhs for mood stabilization  -Continue Trazodone 100mg  qhs for insomnia  -Continue Artane 2mg  bid for EPS prophylaxis  -Patient is continued on agitation protocol  Consider discharge early next week, if pt's mood/psychosis continue to improve   Beau Fanny, FNP 09/23/2014    10:15 AM   Patient seen face-to-face for psychiatric evaluation, chart reviewed and case discussed with the physician extender and developed treatment plan. Reviewed the information documented and agree with the treatment plan. Thedore Mins, MD

## 2014-09-24 MED ORDER — TRIHEXYPHENIDYL HCL 2 MG PO TABS
2.0000 mg | ORAL_TABLET | Freq: Every day | ORAL | Status: DC
  Administered 2014-09-24: 2 mg via ORAL
  Filled 2014-09-24: qty 1
  Filled 2014-09-24: qty 3
  Filled 2014-09-24: qty 1

## 2014-09-24 NOTE — Progress Notes (Signed)
D: Pt presents anxious on approach. Pt thoughts are less disorganized today, pt speech tangential and some loose association during assessment Pt remains paranoid and continues to refuse v/s and refuses to fill-out self inventory sheet. Pt anxious and is often pacing the hallway. Pt intrusive and labile. No aggressive behaviors noted this morning. Pt denies suicidal thoughts. Pt compliant with taking meds and attending groups. A: Medications administered as ordered per MD. Verbal support given. Pt encouraged to attend groups. 15 minute checks performed for safety.  R: Pt safety maintained.

## 2014-09-24 NOTE — Progress Notes (Addendum)
Patient ID: Gregory Meza, male   DOB: [birth year], 49 y.o.   MRN: [id number] [facility] MD Progress Note  09/24/2014 3:00 PM Subjective:   "I am doing well, I do not want any blood test since it is against my religious beliefs.'    Objective:  Patient is a 49 year old with schizoaffective disorder, bipolar type. Pt seen and chart reviewed.  Pt continues to report that he is ready to discharge home.  Pt today seems to be improving , pt is less disorganized , less paranoid , has more stable mood . Pt does reports some dizziness , but refuses blood tests as well as VS . Pt reports that they are against his religious beliefs. Pt to be monitored on the unit .   Principal Problem: Schizoaffective disorder, bipolar type  Diagnosis:   Patient Active Problem List   Diagnosis Date Noted  . Schizoaffective disorder, bipolar type [F25.0]   . 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 Schizoaffective disorder [F25.9] 03/24/2013  . Cocaine dependence [F14.20] 03/23/2013  . Cannabis dependence [F12.20] 03/23/2013  . Psychotic disorder [F29] 03/22/2013  . Schizophrenia [F20.9] 03/29/2012    Class: Chronic  . Substance abuse [F19.10] 03/29/2012    Class: Chronic  . Non-compliant patient [Z91.19] 03/29/2012    Class: Chronic   Total Time spent with patient: 25 minutes  Past Medical History:  Past Medical History  Diagnosis Date  . Schizophrenia    History reviewed. No pertinent past surgical history. Family History: Pt did not express any family hx. Social History:  History  Alcohol Use  . 1.2 oz/week  . 1 Glasses of wine, 1 Cans of beer per week     History  Drug Use Not on file    History   Social History  . Marital Status: Single    Spouse Name: N/A  . Number of Children: N/A  . Years of Education: N/A   Social History Main Topics  . Smoking status: Current Every Day Smoker -- 1.50 packs/day for 3 years    Types: Cigarettes  . Smokeless tobacco: Not on file  .  Alcohol Use: 1.2 oz/week    1 Glasses of wine, 1 Cans of beer per week  . Drug Use: Not on file  . Sexual Activity: Not Currently   Other Topics Concern  . None   Social History Narrative   Additional History:    Sleep: Good  Appetite:  Good  Assessment: See above  Musculoskeletal: Strength & Muscle Tone: within normal limits Gait & Station: normal Patient leans: N/A  Psychiatric Specialty Exam: Physical Exam  Review of Systems  Constitutional: Negative.  Negative for fever, chills, weight loss and malaise/fatigue.  HENT: Negative.  Negative for congestion and sore throat.   Eyes: Negative.  Negative for blurred vision, double vision, discharge and redness.  Respiratory: Negative.  Negative for cough, shortness of breath and wheezing.   Cardiovascular: Negative.  Negative for chest pain and palpitations.  Gastrointestinal: Negative.  Negative for heartburn, nausea, vomiting, abdominal pain, diarrhea and constipation.  Genitourinary: Negative.  Negative for dysuria.  Musculoskeletal: Negative.  Negative for myalgias and falls.  Skin: Negative.  Negative for rash.  Neurological: Negative.  Negative for dizziness, seizures, loss of consciousness and headaches.  Endo/Heme/Allergies: Negative.  Negative for environmental allergies.  Psychiatric/Behavioral: Negative for depression, suicidal ideas, hallucinations and memory loss. The patient is not nervous/anxious and does not have insomnia.        Delusional   All other systems  reviewed and are negative.   Blood pressure 110/77, pulse 82, temperature 99 F (37.2 C), temperature source Oral, resp. rate 18, height  (1.803 m), weight 109.317 kg (241 lb).Body mass index is 33.63 kg/(m^2).  General Appearance: Fairly Groomed  Patent attorney::  Fair  Speech:  Normal Rate  Volume:  Normal  Mood:  anxious - improving  Affect:  Congruent  Thought Process:  Goal Directed  Orientation:  Full (Time, Place, and Person)  Thought  Content:  Rumination   Suicidal Thoughts:  No  Homicidal Thoughts:  No  Memory:  Immediate;   Fair Recent;   Fair Remote;   Fair  Judgement:  Fair  Insight:  Fair  Psychomotor Activity:  Restlessness and improving  Concentration:  Poor  Recall:  Good  Fund of Knowledge:Fair  Language: Fair  Akathisia:  No  Handed:    AIMS (if indicated):     Assets:  Communication Skills Desire for Improvement Resilience Social Support  ADL's:  Intact  Cognition: WNL  Sleep:  Number of Hours: 4.75     Current Medications: Current Facility-Administered Medications  Medication Dose Route Frequency Provider Last Rate Last Dose  . divalproex (DEPAKOTE) DR tablet 1,000 mg  1,000 mg Oral QHS Nelly Rout, MD   1,000 mg at 09/23/14 2116  . ibuprofen (ADVIL,MOTRIN) tablet 600 mg  600 mg Oral Q6H PRN Beau Fanny, FNP   600 mg at 09/23/14 1726  . LORazepam (ATIVAN) tablet 1 mg  1 mg Oral Q8H PRN Craige Cotta, MD   1 mg at 09/24/14 0748  . menthol-cetylpyridinium (CEPACOL) lozenge 3 mg  1 lozenge Oral PRN Beau Fanny, FNP      . multivitamin with minerals tablet 1 tablet  1 tablet Oral Daily Caprice Kluver, MD   1 tablet at 09/24/14 0748  . OLANZapine zydis (ZYPREXA) disintegrating tablet 20 mg  20 mg Oral QHS Nelly Rout, MD   20 mg at 09/23/14 2116  . OLANZapine zydis (ZYPREXA) disintegrating tablet 5 mg  5 mg Oral BID PRN Nelly Rout, MD   5 mg at 09/21/14 1701  . traZODone (DESYREL) tablet 100 mg  100 mg Oral QHS Earney Navy, NP   100 mg at 09/23/14 2116  . trihexyphenidyl (ARTANE) tablet 2 mg  2 mg Oral QHS Yasseen Salls, MD      . ziprasidone (GEODON) injection 20 mg  20 mg Intramuscular Q12H PRN Cleotis Nipper, MD   20 mg at 09/15/14 1305    Lab Results: No results found for this or any previous visit (from the past 48 hour(s)).  Physical Findings: AIMS: Facial and Oral Movements Muscles of Facial Expression: None, normal Lips and Perioral Area: None, normal Jaw:  None, normal Tongue: None, normal,Extremity Movements Upper (arms, wrists, hands, fingers): None, normal Lower (legs, knees, ankles, toes): None, normal, Trunk Movements Neck, shoulders, hips: None, normal, Overall Severity Severity of abnormal movements (highest score from questions above): None, normal Incapacitation due to abnormal movements: None, normal Patient's awareness of abnormal movements (rate only patient's report): No Awareness, Dental Status Current problems with teeth and/or dentures?: No Does patient usually wear dentures?: No  CIWA:    COWS:      Treatment Plan Summary: Patient today seems to be making progress, mood is more stable , denies AH/VH/SI/HI. Will continue treatment. Daily contact with patient to assess and evaluate symptoms and progress in treatment and Medication management  -Patient to continue participate in therapeutic milieu  -  Patient to continue to attend groups   Medications: - Continue Zyprexa Zydis to 20 mg at bedtime for psychosis, since tolerating and effective  - Continue Depakote 1000mg  qhs for mood stabilization . Will get Depakote level tomorrow - pt refuses labs . -Continue Trazodone 100mg  qhs for insomnia  -Continue Artane 2mg  , reduce to po qhs for EPS prophylaxis  -Patient is continued on agitation protocol   I certify that the services received since the previous certification/recertification were and continue to be medically necessary as the treatment provided can be reasonably expected to improve the patient's condition; the medical record documents that the services furnished were intensive treatment services or their equivalent services, and this patient continues to need, on a daily basis, active treatment furnished directly by or requiring the supervision of inpatient psychiatric personnel.   CEappen,Nalin Mazzocco, MD 09/23/2014  3:00  PM

## 2014-09-24 NOTE — Progress Notes (Signed)
Patient seen pacing the hall way. Continues to be intrusive and was verbally redirected on several occassions. Denies pain, SI, AH/VH at this time. Patient rated depression and anxiety 5/10 and 7/10 respectively. Complaint with medications. No new complaint. Will continue to monitor patient for safety and stability.
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
## 2014-09-25 DIAGNOSIS — F1220 Cannabis dependence, uncomplicated: Secondary | ICD-10-CM | POA: Diagnosis present

## 2014-09-25 DIAGNOSIS — F1420 Cocaine dependence, uncomplicated: Secondary | ICD-10-CM | POA: Diagnosis present

## 2014-09-25 MED ORDER — DIVALPROEX SODIUM 500 MG PO DR TAB: 1000.0000 mg | DELAYED_RELEASE_TABLET | Freq: Every day | ORAL | Status: DC

## 2014-09-25 MED ORDER — TRAZODONE HCL 100 MG PO TABS: 100.0000 mg | ORAL_TABLET | Freq: Every day | ORAL | Status: DC

## 2014-09-25 MED ORDER — ADULT MULTIVITAMIN W/MINERALS CH: 1.0000 | ORAL_TABLET | Freq: Every day | ORAL | Status: DC

## 2014-09-25 MED ORDER — OLANZAPINE 20 MG PO TBDP: 20.0000 mg | ORAL_TABLET | Freq: Every day | ORAL | Status: DC

## 2014-09-25 MED ORDER — TRIHEXYPHENIDYL HCL 2 MG PO TABS: 2.0000 mg | ORAL_TABLET | Freq: Every day | ORAL | Status: DC

## 2014-09-25 NOTE — [facility] Group Notes (Signed)
The focus of this group is to educate the patient on the purpose and policies of crisis stabilization and provide a format to answer questions about their admission.  The group details unit policies and expectations of patients while admitted.  Patient attended 0900 nurse education orientation group this morning.  Patient did not participate, blank affect.  Patient was alert, but no insight and no engagement.

## 2014-09-25 NOTE — Progress Notes (Signed)
  [facility] Adult Case Management Discharge Plan :  Will you be returning to the same living situation after discharge:  Yes,  home At discharge, do you have transportation home?: Yes,  guardain Do you have the ability to pay for your medications: Yes,  MCD  Release of information consent forms completed and in the chart;  Patient's signature needed at discharge.  Patient to Follow up at: Follow-up Information    Follow up with [facility] On 09/26/2014.   Why:  Your ride will pick you up tomorrow morning to take you back to the [facility] program   Contact information:   [address]  [place]  [phone]      Follow up with [facility] On 10/05/2014.   Why:  Friday at 3:00 with Dr Elvera Bicker information:   [address]  [place]  [phone]      Patient denies SI/HI: Yes,  yes    Aeronautical engineer and Suicide Prevention discussed: Yes,  yes  Have you used any form of tobacco in the last 30 days? (Cigarettes, Smokeless Tobacco, Cigars, and/or Pipes): Yes  Has patient been referred to the Quitline?: Patient refused referral  [name] B 09/25/2014, 10:00 AM

## 2014-09-25 NOTE — [facility] Suicide Risk Assessment (Signed)
[facility] Discharge Suicide Risk Assessment   Demographic Factors:  Male  Total Time spent with patient: 30 minutes  Musculoskeletal: Strength & Muscle Tone: within normal limits Gait & Station: normal Patient leans: N/A  Psychiatric Specialty Exam: Physical Exam  Review of Systems  Psychiatric/Behavioral: Positive for substance abuse.  All other systems reviewed and are negative.   Blood pressure 110/77, pulse 82, temperature 99 F (37.2 C), temperature source Oral, resp. rate 18, height 5\' 11"  (1.803 m), weight 109.317 kg (241 lb).Body mass index is 33.63 kg/(m^2).  General Appearance: Fairly Groomed  Patent attorney::  Good  Speech:  Clear and Coherent409  Volume:  Normal  Mood:  Euthymic  Affect:  Appropriate  Thought Process:  Coherent  Orientation:  Full (Time, Place, and Person)  Thought Content:  WDL  Suicidal Thoughts:  No  Homicidal Thoughts:  No  Memory:  Immediate;   Fair Recent;   Fair Remote;   Fair  Judgement:  Fair  Insight:  Fair  Psychomotor Activity:  Normal  Concentration:  Fair  Recall:  [employer] of Knowledge:Fair  Language: Fair  Akathisia:  No  Handed:  Right  AIMS (if indicated):     Assets:  Communication Skills Desire for Improvement  Sleep:  Number of Hours: 4.25  Cognition: WNL  ADL's:  Intact   Have you used any form of tobacco in the last 30 days? (Cigarettes, Smokeless Tobacco, Cigars, and/or Pipes): Yes  Has this patient used any form of tobacco in the last 30 days? (Cigarettes, Smokeless Tobacco, Cigars, and/or Pipes) Yes, A prescription for an FDA-approved tobacco cessation medication was offered at discharge and the patient refused  Mental Status Per Nursing Assessment::   On Admission:  NA  Current Mental Status by Physician: patient denies SI/HI/AH/VH  Loss Factors: NA  Historical Factors: Impulsivity  Risk Reduction Factors:   Positive social support  Continued Clinical Symptoms:  Alcohol/Substance  Abuse/Dependencies  Cognitive Features That Contribute To Risk:  Polarized thinking    Suicide Risk:  Minimal: No identifiable suicidal ideation.  Patients presenting with no risk factors but with morbid ruminations; may be classified as minimal risk based on the severity of the depressive symptoms  Principal Problem: Schizoaffective disorder, bipolar type -ACUTE PHASE RESOLVED Discharge Diagnoses:  Patient Active Problem List   Diagnosis Date Noted  . Cocaine use disorder, moderate, dependence [F14.20] 09/25/2014  . Cannabis use disorder, moderate, dependence [F12.20] 09/25/2014  . Schizoaffective disorder, bipolar type [F25.0]   . Substance abuse [F19.10] 03/29/2012    Class: Chronic  . Non-compliant patient [Z91.19] 03/29/2012    Class: Chronic    Follow-up Information    Follow up with [facility].   Why:  Resume going to the [facility] program the day after discharge    Contact information:   [address]  [place]  [336] [phone]      Follow up with Dr Omelia Blackwater.   Contact information:   [address] [facility]  [336] [phone]      Plan Of Care/Follow-up recommendations:  Activity:  No restrictions Diet:  regular Tests:  Patient adviced to follow up CBC,LFT ,Depakote level with out patient provider since he is on depakote therapy - but patient reports that it is against his religious beliefs. Other:  follow up with after care  Is patient on multiple antipsychotic therapies at discharge:  No   Has Patient had three or more failed trials of antipsychotic monotherapy by history:  No  Recommended Plan for Multiple Antipsychotic Therapies: NA    Akilah Cureton MD 09/25/2014, 9:11 AM

## 2014-09-25 NOTE — Discharge Summary (Signed)
Physician Discharge Summary Note  Patient:  STEPFON Meza is an 49 y.o., male MRN:  [id number] DOB:  1965/04/11 Patient phone:  [phone] (home)  Patient address:   [address] [place] [address],  Total Time spent with patient: 20 minutes  Date of Admission:  09/11/2014 Date of Discharge: 09/25/2014  Reason for Admission:  Acute psychosis   Principal Problem: Schizoaffective disorder, bipolar type Discharge Diagnoses: Patient Active Problem List   Diagnosis Date Noted  . Cocaine use disorder, moderate, dependence [F14.20] 09/25/2014  . Cannabis use disorder, moderate, dependence [F12.20] 09/25/2014  . Schizoaffective disorder, bipolar type [F25.0]   . Substance abuse [F19.10] 03/29/2012    Class: Chronic  . Non-compliant patient [Z91.19] 03/29/2012    Class: Chronic    Musculoskeletal: Strength & Muscle Tone: within normal limits Gait & Station: normal Patient leans: N/A  Psychiatric Specialty Exam: Physical Exam  Psychiatric: He has a normal mood and affect. His speech is normal and behavior is normal. Judgment and thought content normal. Cognition and memory are normal.    Review of Systems  Constitutional: Negative.   HENT: Negative.   Eyes: Negative.   Respiratory: Negative.   Cardiovascular: Negative.   Gastrointestinal: Negative.   Genitourinary: Negative.   Musculoskeletal: Negative.   Skin: Negative.   Neurological: Negative.   Endo/Heme/Allergies: Negative.   Psychiatric/Behavioral: Positive for hallucinations (Stabilized ). Negative for depression, suicidal ideas, memory loss and substance abuse. The patient is not nervous/anxious and does not have insomnia.     Blood pressure 110/77, pulse 82, temperature 99 F (37.2 C), temperature source Oral, resp. rate 18, height 5\' 11"  (1.803 m), weight 109.317 kg (241 lb).Body mass index is 33.63 kg/(m^2).  See Physician SRA     Have you used any form of tobacco in the last 30 days? (Cigarettes,  Smokeless Tobacco, Cigars, and/or Pipes): Yes  Has this patient used any form of tobacco in the last 30 days? (Cigarettes, Smokeless Tobacco, Cigars, and/or Pipes) Yes, A prescription for an FDA-approved tobacco cessation medication was offered at discharge and the patient refused  Past Medical History:  Past Medical History  Diagnosis Date  . Schizophrenia    History reviewed. No pertinent past surgical history. Family History: History reviewed. No pertinent family history. Social History:  History  Alcohol Use  . 1.2 oz/week  . 1 Glasses of wine, 1 Cans of beer per week     History  Drug Use Not on file    History   Social History  . Marital Status: Single    Spouse Name: N/A  . Number of Children: N/A  . Years of Education: N/A   Social History Main Topics  . Smoking status: Current Every Day Smoker -- 1.50 packs/day for 3 years    Types: Cigarettes  . Smokeless tobacco: Not on file  . Alcohol Use: 1.2 oz/week    1 Glasses of wine, 1 Cans of beer per week  . Drug Use: Not on file  . Sexual Activity: Not Currently   Other Topics Concern  . None   Social History Narrative    Risk to Self: Is patient at risk for suicide?: No Risk to Others:   Prior Inpatient Therapy:   Prior Outpatient Therapy:    Level of Care:  OP  Hospital Course:    Gregory Meza is an 49 y.o. male presenting to ED at the suggestion of his case manager Ms. Ander Slade, per pt report. At the time of assessment pt  is alert and oriented to person, place, and time. Pt acknowledges he has AVH, then shortly after denies AVH and reports he is really experiencing battles with demons and the devil. Pt appears to be a poor historian and appears to be influenced by delusions at present. Pt denies SI, stating "I love myself, I told them I was suicidal but, I am not, I love myself." Pt reports he may have HI once in his life but not at present. He denies self harm and SA. Pt reports he is stressed worrying about  his grandson and great grandson, and also about being falsely accused of kicking someone. Pt is afraid this person will press charges on him and he will end up in jail. Pt sts he wants to be [facility] to [facility]. Pt is very guarded about his delusions reporting that if he speaks about them he will be putting himself and others in danger. Pt reported he was very depressed and then said he was not depressed, but rather was feeling like his medication was not working correctly. Speech is slightly pressured and pt seems to be experiencing some thought blocking. Pt denies hx of panic attacks, denies hx of of abuse or neglect. He reports he worries a lot but feels that is his job as a parent and grandparent. Pt denies use of alcohol or drugs, UDS and BAL are pending. Pt reports he refuses to give BAL because it is against his religion. Pt denies family hx of MH, SA, or SI concerns. Pt is able to contract for safety, and reports he has no thoughts of harming himself or others, However, he clearly appears to be influenced by current delusions. Per ED staff he has been throwing things at them, and aggressive to staff.          Gregory Meza was admitted to the adult 500 unit. He was evaluated and his symptoms were identified. Medication management was discussed and initiated. The patient was continued on Depakote DR 1,000 mg at bedtime for improved mood stability. He was initially started on Abilify for psychosis but this was found to be ineffective. The patient was then started on Zyprexa at bedtime.  He was oriented to the unit and encouraged to participate in unit programming. Medical problems were identified and treated appropriately. Home medication was restarted as needed.        The patient was evaluated each day by a clinical provider to ascertain the patient's response to treatment. The patient was difficult to understand at times due to pressured speech. He was also noted to be very delusional and disorganized in  his speech. The patient had several episodes of verbal aggression. On one occasion during admission he became hostile and threatening towards staff. The patient was able to calm down with IM medications. He often appeared delusional during conversations such as that he was similar to [employer] His aggression improved as his medications were adjusted. There were not further physical threats made towards staff. Improvement was noted by the patient's report of decreasing symptoms, improved sleep and appetite, affect, medication tolerance, behavior, and participation in unit programming.  He was asked each day to complete a self inventory noting mood, mental status, pain, new symptoms, anxiety and concerns.         He responded well to medication and being in a therapeutic and supportive environment. Positive and appropriate behavior was noted and the patient was motivated for recovery.  The patient worked closely with the treatment team  and case manager to develop a discharge plan with appropriate goals. Coping skills, problem solving as well as relaxation therapies were also part of the unit programming.         By the day of discharge he was in much improved condition than upon admission.  Symptoms were reported as significantly decreased or resolved completely. The patient denied SI/HI and voiced no AVH. He was motivated to continue taking medication with a goal of continued improvement in mental health.  Gregory Meza was discharged home with a plan to follow up as noted below. The patient was provided with three day supply of sample medications and prescriptions at time of discharge. He left [facility] in stable condition with all belongings returned to him.   Consults:  None  Significant Diagnostic Studies:  Chemistry profile, CBC, UDS positive for cocaine   Discharge Vitals:   Blood pressure 110/77, pulse 82, temperature 99 F (37.2 C), temperature source Oral, resp. rate 18, height  (1.803 m),  weight 109.317 kg (241 lb). Body mass index is 33.63 kg/(m^2). Lab Results:   No results found for this or any previous visit (from the past 72 hour(s)).  Physical Findings: AIMS: Facial and Oral Movements Muscles of Facial Expression: None, normal Lips and Perioral Area: None, normal Jaw: None, normal Tongue: None, normal,Extremity Movements Upper (arms, wrists, hands, fingers): None, normal Lower (legs, knees, ankles, toes): None, normal, Trunk Movements Neck, shoulders, hips: None, normal, Overall Severity Severity of abnormal movements (highest score from questions above): None, normal Incapacitation due to abnormal movements: None, normal Patient's awareness of abnormal movements (rate only patient's report): No Awareness, Dental Status Current problems with teeth and/or dentures?: No Does patient usually wear dentures?: No  CIWA:    COWS:      See Psychiatric Specialty Exam and Suicide Risk Assessment completed by Attending Physician prior to discharge.  Discharge destination:  Home  Is patient on multiple antipsychotic therapies at discharge:  No   Has Patient had three or more failed trials of antipsychotic monotherapy by history:  No   Recommended Plan for Multiple Antipsychotic Therapies: NA     Medication List    STOP taking these medications        cetirizine 10 MG tablet  Commonly known as:  ZYRTEC      TAKE these medications      Indication   divalproex 500 MG DR tablet  Commonly known as:  DEPAKOTE  Take 2 tablets (1,000 mg total) by mouth at bedtime.   Indication:  Schizoaffective Disorder     multivitamin with minerals Tabs tablet  Take 1 tablet by mouth daily.   Indication:  Vitamin Supplementation     OLANZapine zydis 20 MG disintegrating tablet  Commonly known as:  ZYPREXA  Take 1 tablet (20 mg total) by mouth at bedtime.   Indication:  Schizoaffective Disorder     traZODone 100 MG tablet  Commonly known as:  DESYREL  Take 1 tablet  (100 mg total) by mouth at bedtime.   Indication:  Aggressive Behavior, Trouble Sleeping     trihexyphenidyl 2 MG tablet  Commonly known as:  ARTANE  Take 1 tablet (2 mg total) by mouth at bedtime.   Indication:  Extrapyramidal Reaction caused by Medications       Follow-up Information    Follow up with [facility] On 09/26/2014.   Why:  Your ride will pick you up tomorrow morning to take you back to the [facility] program  Contact information:   [address]  [place]  [phone]      Follow up with [facility] On 10/05/2014.   Why:  Friday at 3:00 with Dr Elvera Bicker information:   [address]  [place]  [phone]      Follow-up recommendations:    Activity: No restrictions Diet: regular Tests: Patient adviced to follow up CBC,LFT ,Depakote level with out patient provider since he is on depakote therapy - but patient reports that it is against his religious beliefs. Other: follow up with after care   Comments:   Take all your medications as prescribed by your mental healthcare provider.  Report any adverse effects and or reactions from your medicines to your outpatient provider promptly.  Patient is instructed and cautioned to not engage in alcohol and or illegal drug use while on prescription medicines.  In the event of worsening symptoms, patient is instructed to call the crisis hotline, 911 and or go to the nearest ED for appropriate evaluation and treatment of symptoms.  Follow-up with your primary care provider for your other medical issues, concerns and or health care needs.   Total Discharge Time: Greater than 30 minutes   Signed: Tiyonna Sardinha NP-C 09/25/2014, 6:41 PM

## 2014-09-25 NOTE — Tx Team (Signed)
Interdisciplinary Treatment Plan Update (Adult)  Date:  09/25/2014   Time Reviewed:  10:24 AM   Progress in Treatment: Attending groups: Yes. Participating in groups:  Yes. Taking medication as prescribed:  Yes.  Tolerating medication:  Yes. Family/Significant othe contact made:  Yes Patient understands diagnosis:  No  Limited insight Discussing patient identified problems/goals with staff:  Yes, see initial care plan. Medical problems stabilized or resolved:  Yes. Denies suicidal/homicidal ideation: Yes. Issues/concerns per patient self-inventory:  No. Other:  New problem(s) identified:  Discharge Plan or Barriers:  Return home, follow up outpt  Reason for Continuation of Hospitalization:   Comments: " I apologize for my behavior. I want to discharge today. I have to go to the grocery store."  Yesterday patient became very aggressive and threatening to this Probation officer. He came with aggressive posture and about to hit this Probation officer and staff intervened . He was given medication and placed on close observation. Today patient is more calm and cooperative. He apologizes for his behavior. He is taking his medication however he remains very delusional, psychotic, grandiose and disorganized. His his speech remains very pressured and fast. He continues to believe that he is previously white person and reborn as a black person. He believes he is Jesus. He continues to talk about demons and able to spirits. He does not believe he has any psychiatric illness and he requested to be discharged so he can go to the grocery store. However he was able to follow instruction from the staff. Later he apologizes for his behavior and promised that he will take the medication. His Depakote level is pending. His Abilify was increased 15 mg. Patient is reported no side effects including any tremors or shakes. Patient was seen by another psychiatrist yesterday to get second opinion if patient needs forced  medication due to noncompliance with medication. However so far patient is taking his medication. Abilify, Depakote trial     Geodon and Ativan ordered IM for agitation  8/4/16Earlier this week patient changed from Abilify to scheduled Zyprexa, with some success.  While he continues to be pressured, tangential and disorganized, he is able to flash glimpses of goal directed conversation, and his mood is not fluctuating as much.  Asks daily for discharge  Estimated length of stay: D/C today  New goal(s):  Review of initial/current patient goals per problem list:   Review of initial/current patient goals per problem list:  1. Goal(s): Patient will participate in aftercare plan   Met: Yes   Target date: 3-5 days post admission date   As evidenced by: Patient will participate within aftercare plan AEB aftercare provider and housing plan at discharge being identified.  09/12/14:  Pt will return home and follow up outpt      5. Goal(s): Patient will demonstrate decreased signs of psychosis  * Met: Yes  * Target date: 3-5 days post admission date  * As evidenced by: Patient will demonstrate decreased frequency of AVH or return to baseline function  09/12/14: Pt is currently peasantly psychotic and delusional.  Likely has not been taking medication for some time.  Likely substance abuse as well based on past history. 09/17/14: Pt continues disorganized and delusional.  He has also displayed more mood lability, including threatening a Dr and nurse over the weekend, resulting in forced Medication order.   09/20/14  Continues with disorganization and delusions.  Mood is better, more stable with no anger outbursts. 09/25/2014 Pt at baseline.      [place]  Attendees: Patient:  09/25/2014 10:24 AM   Family:   09/25/2014 10:24 AM   Physician:  Ursula Alert, MD 09/25/2014 10:24 AM   Nursing:   Darrol Angel, RN 09/25/2014 10:24 AM   CSW:    Roque Lias, LCSW   09/25/2014 10:24 AM   Other:  09/25/2014  10:24 AM   Other:   09/25/2014 10:24 AM   Other:  Lars Pinks, Nurse CM 09/25/2014 10:24 AM   Other:  Lucinda Dell, [facility] 09/25/2014 10:24 AM   Other:  Norberto Sorenson, [place]  09/25/2014 10:24 AM   Other:  09/25/2014 10:24 AM   Other:  09/25/2014 10:24 AM   Other:  09/25/2014 10:24 AM   Other:  09/25/2014 10:24 AM   Other:  09/25/2014 10:24 AM   Other:   09/25/2014 10:24 AM    Scribe for Treatment Team:   Trish Mage, 09/25/2014 10:24 AM

## 2014-09-25 NOTE — Progress Notes (Signed)
Discharge note: Pt discharge to DSS guardian. Both received verbal and written discharge instructions. Pt agreed to f/u appt and med regimen. Both verbalized discharge instructions. Consent forms signed by DSS guardian. Pt received belongings from room and locker. Prescriptions and sample meds given to DSS guardian. Pt safely left [facility].

## 2015-01-29 ENCOUNTER — Emergency Department (HOSPITAL_COMMUNITY)
Admission: EM | Admit: 2015-01-29 | Discharge: 2015-01-29 | Disposition: A | Discharge status: Home / Self Care | Payer: Medicaid Other

## 2015-01-29 NOTE — ED Notes (Signed)
Pt did not answer once name was called

## 2015-02-01 ENCOUNTER — Emergency Department (HOSPITAL_COMMUNITY)
Admission: EM | Admit: 2015-02-01 | Discharge: 2015-02-08 | Disposition: A | Discharge status: Other Acute Inpatient Hospital | Payer: Medicaid Other | Attending: Emergency Medicine | Admitting: Emergency Medicine

## 2015-02-01 ENCOUNTER — Encounter (HOSPITAL_COMMUNITY): Payer: Self-pay | Admitting: Emergency Medicine

## 2015-02-01 DIAGNOSIS — F17210 Nicotine dependence, cigarettes, uncomplicated: Secondary | ICD-10-CM | POA: Diagnosis not present

## 2015-02-01 DIAGNOSIS — Z9119 Patient's noncompliance with other medical treatment and regimen: Secondary | ICD-10-CM | POA: Diagnosis not present

## 2015-02-01 DIAGNOSIS — F1420 Cocaine dependence, uncomplicated: Secondary | ICD-10-CM | POA: Diagnosis present

## 2015-02-01 DIAGNOSIS — F29 Unspecified psychosis not due to a substance or known physiological condition: Secondary | ICD-10-CM

## 2015-02-01 DIAGNOSIS — F250 Schizoaffective disorder, bipolar type: Secondary | ICD-10-CM | POA: Diagnosis not present

## 2015-02-01 DIAGNOSIS — F23 Brief psychotic disorder: Secondary | ICD-10-CM | POA: Diagnosis not present

## 2015-02-01 DIAGNOSIS — Z9114 Patient's other noncompliance with medication regimen: Secondary | ICD-10-CM

## 2015-02-01 DIAGNOSIS — Z79899 Other long term (current) drug therapy: Secondary | ICD-10-CM | POA: Insufficient documentation

## 2015-02-01 DIAGNOSIS — R4182 Altered mental status, unspecified: Secondary | ICD-10-CM | POA: Diagnosis not present

## 2015-02-01 DIAGNOSIS — Z046 Encounter for general psychiatric examination, requested by authority: Secondary | ICD-10-CM | POA: Diagnosis present

## 2015-02-01 LAB — URINALYSIS, ROUTINE W REFLEX MICROSCOPIC
BILIRUBIN URINE: NEGATIVE
Glucose, UA: NEGATIVE mg/dL
HGB URINE DIPSTICK: NEGATIVE
KETONES UR: NEGATIVE mg/dL
LEUKOCYTES UA: NEGATIVE
NITRITE: NEGATIVE
PROTEIN: NEGATIVE mg/dL
SPECIFIC GRAVITY, URINE: 1.016 (ref 1.005–1.030)
pH: 6 (ref 5.0–8.0)

## 2015-02-01 LAB — COMPREHENSIVE METABOLIC PANEL
ALT: 35 U/L (ref 17–63)
AST: 44 U/L — ABNORMAL HIGH (ref 15–41)
Albumin: 4.4 g/dL (ref 3.5–5.0)
Alkaline Phosphatase: 74 U/L (ref 38–126)
Anion gap: 7 (ref 5–15)
BUN: 8 mg/dL (ref 6–20)
CO2: 25 mmol/L (ref 22–32)
CREATININE: 0.7 mg/dL (ref 0.61–1.24)
Calcium: 8.8 mg/dL — ABNORMAL LOW (ref 8.9–10.3)
Chloride: 103 mmol/L (ref 101–111)
Glucose, Bld: 95 mg/dL (ref 65–99)
Potassium: 3.7 mmol/L (ref 3.5–5.1)
Sodium: 135 mmol/L (ref 135–145)
Total Bilirubin: 0.5 mg/dL (ref 0.3–1.2)
Total Protein: 7.5 g/dL (ref 6.5–8.1)

## 2015-02-01 LAB — CBC WITH DIFFERENTIAL/PLATELET
Basophils Absolute: 0 10*3/uL (ref 0.0–0.1)
Basophils Relative: 0 %
EOS ABS: 0.4 10*3/uL (ref 0.0–0.7)
Eosinophils Relative: 5 %
HCT: 39.5 % (ref 39.0–52.0)
Hemoglobin: 13.7 g/dL (ref 13.0–17.0)
Lymphocytes Relative: 35 %
Lymphs Abs: 2.7 10*3/uL (ref 0.7–4.0)
MCH: 30.1 pg (ref 26.0–34.0)
MCHC: 34.7 g/dL (ref 30.0–36.0)
MCV: 86.8 fL (ref 78.0–100.0)
MONO ABS: 0.7 10*3/uL (ref 0.1–1.0)
Monocytes Relative: 9 %
Neutro Abs: 3.9 10*3/uL (ref 1.7–7.7)
Neutrophils Relative %: 51 %
PLATELETS: 259 10*3/uL (ref 150–400)
RBC: 4.55 MIL/uL (ref 4.22–5.81)
RDW: 13.9 % (ref 11.5–15.5)
WBC: 7.6 10*3/uL (ref 4.0–10.5)

## 2015-02-01 LAB — RAPID URINE DRUG SCREEN, HOSP PERFORMED
AMPHETAMINES: NOT DETECTED
Barbiturates: NOT DETECTED
Benzodiazepines: NOT DETECTED
Cocaine: NOT DETECTED
OPIATES: NOT DETECTED
Tetrahydrocannabinol: NOT DETECTED

## 2015-02-01 LAB — ACETAMINOPHEN LEVEL

## 2015-02-01 LAB — ETHANOL: Alcohol, Ethyl (B): <5 mg/dL (ref <5)

## 2015-02-01 LAB — VALPROIC ACID LEVEL: Valproic Acid Lvl: <10 ug/mL — ABNORMAL LOW (ref 50.0–100.0)

## 2015-02-01 LAB — SALICYLATE LEVEL

## 2015-02-01 MED ORDER — STERILE WATER FOR INJECTION IJ SOLN
INTRAMUSCULAR | Status: AC
  Administered 2015-02-01: 19:00:00
  Filled 2015-02-01: qty 10

## 2015-02-01 MED ORDER — IBUPROFEN 200 MG PO TABS: 600.0000 mg | ORAL_TABLET | Freq: Three times a day (TID) | ORAL | Status: DC | PRN

## 2015-02-01 MED ORDER — NICOTINE 21 MG/24HR TD PT24
21.0000 mg | MEDICATED_PATCH | Freq: Every day | TRANSDERMAL | Status: DC | PRN
  Filled 2015-02-01: qty 1

## 2015-02-01 MED ORDER — ACETAMINOPHEN 325 MG PO TABS: 650.0000 mg | ORAL_TABLET | ORAL | Status: DC | PRN

## 2015-02-01 MED ORDER — ZIPRASIDONE MESYLATE 20 MG IM SOLR
20.0000 mg | Freq: Once | INTRAMUSCULAR | Status: AC
  Administered 2015-02-04: 20 mg via INTRAMUSCULAR
  Filled 2015-02-01: qty 20

## 2015-02-01 MED ORDER — ZIPRASIDONE MESYLATE 20 MG IM SOLR
INTRAMUSCULAR | Status: AC
  Administered 2015-02-01: 19:00:00
  Filled 2015-02-01: qty 20

## 2015-02-01 MED ORDER — ONDANSETRON HCL 4 MG PO TABS: 4.0000 mg | ORAL_TABLET | Freq: Three times a day (TID) | ORAL | Status: DC | PRN

## 2015-02-01 MED ORDER — ALUM & MAG HYDROXIDE-SIMETH 200-200-20 MG/5ML PO SUSP: 30.0000 mL | ORAL | Status: DC | PRN

## 2015-02-01 MED ORDER — LORAZEPAM 2 MG/ML IJ SOLN
2.0000 mg | Freq: Once | INTRAMUSCULAR | Status: AC
  Administered 2015-02-01: 2 mg via INTRAMUSCULAR
  Filled 2015-02-01: qty 1

## 2015-02-01 MED ORDER — ZOLPIDEM TARTRATE 5 MG PO TABS: 5.0000 mg | ORAL_TABLET | Freq: Every evening | ORAL | Status: DC | PRN

## 2015-02-01 NOTE — ED Notes (Signed)
Pt. Noted sleeping in room. No complaints or concerns voiced. No distress or abnormal behavior noted. Will continue to monitor with security cameras. Q 15 minute rounds continue. 

## 2015-02-01 NOTE — ED Notes (Signed)
[id number] Expected date:  Expected time:  Means of arrival:  Comments: Hold for triage 6

## 2015-02-01 NOTE — ED Provider Notes (Signed)
CSN: [id number]     Arrival date & time 02/01/15  1449 History   First MD Initiated Contact with Patient 02/01/15 1530     Chief Complaint  Patient presents with  . IVC      (Consider location/radiation/quality/duration/timing/severity/associated sxs/prior Treatment) HPI   Gregory Meza is a 49 y.o. male  who presents for evaluation of bizarre and aggressive behavior. His daycare program, apparently alleges that he was being disruptive, aggressive, and threatening. The patient was taken to a local psychiatric facility, where an involuntary commitment was taken out any first opinion support paperwork work was signed. He was transferred here by police officers, for evaluation. He has previously been admitted to a [facility] psychiatric hospital. The patient is apparently not taking his medications. The patient is not cooperative for history or physical examination.  Level V caveat- altered mental status  Past Medical History  Diagnosis Date  . Schizophrenia (HCC)    History reviewed. No pertinent past surgical history. No family history on file. Social History  Substance Use Topics  . Smoking status: Current Every Day Smoker -- 1.50 packs/day for 3 years    Types: Cigarettes  . Smokeless tobacco: None  . Alcohol Use: 1.2 oz/week    1 Glasses of wine, 1 Cans of beer per week    Review of Systems  Unable to perform ROS: Psychiatric disorder      Allergies  Haldol and Risperidone and related  Home Medications   Prior to Admission medications   Medication Sig Start Date End Date Taking? Authorizing Provider  divalproex (DEPAKOTE) 500 MG DR tablet Take 2 tablets (1,000 mg total) by mouth at bedtime. 09/25/14   Thermon Leyland, NP  Multiple Vitamin (MULTIVITAMIN WITH MINERALS) TABS tablet Take 1 tablet by mouth daily. 09/25/14   Thermon Leyland, NP  OLANZapine zydis (ZYPREXA) 20 MG disintegrating tablet Take 1 tablet (20 mg total) by mouth at bedtime. 09/25/14   Thermon Leyland, NP   traZODone (DESYREL) 100 MG tablet Take 1 tablet (100 mg total) by mouth at bedtime. 09/25/14   Thermon Leyland, NP  trihexyphenidyl (ARTANE) 2 MG tablet Take 1 tablet (2 mg total) by mouth at bedtime. 09/25/14   Thermon Leyland, NP   BP 162/86 mmHg  Pulse 96  Temp(Src) 98.1 F (36.7 C) (Oral)  SpO2 98% Physical Exam  Constitutional: He appears well-developed and well-nourished. He appears distressed.  The patient asked to shake my hand, and violently squeezed it, requiring me to remove it from his grip.  HENT:  Head: Normocephalic and atraumatic.  Right Ear: External ear normal.  Left Ear: External ear normal.  Eyes: Conjunctivae and EOM are normal. Pupils are equal, round, and reactive to light.  Neck: Normal range of motion and phonation normal. Neck supple.  Cardiovascular: Normal rate.   Pulmonary/Chest: Effort normal. He exhibits no bony tenderness.  Musculoskeletal: Normal range of motion.  Neurological: He is alert. No cranial nerve deficit or sensory deficit. He exhibits normal muscle tone. Coordination normal.  No dysarthria.  Skin: Skin is warm, dry and intact.  Psychiatric:  Disorder thought process. Patient confabulating, pointing to a necklace that he is wearing, and discussing various attributes of it, that are completely meaningless. He does not answer questions, regarding his mental status or physical well-being.  Nursing note and vitals reviewed.   ED Course  Procedures (including critical care time)  Medications  ziprasidone (GEODON) injection 20 mg (not administered)  LORazepam (ATIVAN) injection 2  mg (not administered)    Patient Vitals for the past 24 hrs:  BP Temp Temp src Pulse SpO2  02/01/15 1458 162/86 mmHg 98.1 F (36.7 C) Oral 96 98 %    Patient presented with valid IVC, and first opinion paperwork, from [facility].  Consult TTS   Labs Review Labs Reviewed  COMPREHENSIVE METABOLIC PANEL - Abnormal; Notable for the following:    Calcium 8.8 (*)     AST 44 (*)    All other components within normal limits  ACETAMINOPHEN LEVEL - Abnormal; Notable for the following:    Acetaminophen (Tylenol), Serum <10 (*)    All other components within normal limits  ETHANOL  CBC WITH DIFFERENTIAL/PLATELET  URINE RAPID DRUG SCREEN, HOSP PERFORMED  URINALYSIS, ROUTINE W REFLEX MICROSCOPIC (NOT AT [facility])  SALICYLATE LEVEL  VALPROIC ACID LEVEL    Imaging Review No results found. I have personally reviewed and evaluated these images and lab results as part of my medical decision-making.   EKG Interpretation None      MDM   Final diagnoses:  Psychosis, unspecified psychosis type  Noncompliance with medication regimen    Acute psychosis with medication noncompliance.  Nursing Notes Reviewed/ Care Coordinated, and agree without changes. Applicable Imaging Reviewed.  Interpretation of Laboratory Data incorporated into ED treatment   Plan: Admit to psychiatric facility.    Mancel BaleElliott Kassidie Hendriks, MD 02/01/15 [phone]

## 2015-02-01 NOTE — ED Notes (Signed)
[facility]. Pt. Alert and oriented in no distress denies SI, HI, AVH and pain.  Pt. Pacing in hall. Pt. Instructed to come to me with problems or concerns.Will continue to monitor for safety via security cameras and Q 15 minute checks.

## 2015-02-01 NOTE — Progress Notes (Signed)
Patient listed as having Medicaid [place] access without a pcp.  Pcp listed on patient's Medicaid card is [facility] at [facility] [address] [phone] or [address] [phone], this is patient's pcp.  If patient decides he would like to change his pcp he may do so through the DSS [phone].  No further EDCM needs at this time.

## 2015-02-01 NOTE — ED Notes (Addendum)
Patient arrived to room 35, very loud and demanding food.  When assessing patient he was speaking very rapid and incoherent.  He appears to be responding to internal stimuli,  He is hyperactive and pacing around room.  He is hyper religious.   Instructed pt. To remain in either day room or his room.  15 minute checks and video monitoring in place.  Patient is also going into clean rooms and standing at nurses station picking his nose and licking his fingers.

## 2015-02-01 NOTE — BH Assessment (Signed)
Assessment Note  Gregory Meza is an 49 y.o. male who presents to [facility] via [facility] under IVC from Dr. Marguerita BeardsKenneth J. Headen. IVC States:  Patient suffers from chronic schizophrenia. He is using cocaine on a daily basis and will not take oral medications. He is acutely psychotic and has threatened staff of day program and shown hostility toward his workers. He has been removed by the police twice in the past week. He cannot conduct himself safely in the community in his current mental state. He needs hospitalization for stabilization and proper disposition"  Patient speaking very rapidly and making clicking sounds with his mouth during the assessment. Patient states that his name is Gregory Meza. Patient states that he lives alone and does not have a psychiatrist. Patient states that he is here because he just graduated from [employer] [facility] [employer] with "two masters in [employer] management and psychology and I got a hundred in both of 'em so they sent me here to save lives, white lives, black lives matter, all lives." Patient had flight of ideas and stated the following, "sometimes I got bipolar disorder, it gives me a headache.: "Close your eyes and everything will stay in place except me (closes eyes) you watching, look, I'm moving." When asked if he wanted to kill himself patient states "If you got a question mark, I ain't got shit to tell you." Patient asked this writer "do you know what a punctuation mark is? What it look like? What it mean?" Patient starts to refer to this writer as "Gregory Meza" and states that he remembers Gregory Meza. He states "Gregory Meza, my fiancee, she's half possum and half dragon, call her, if she cheating on me then who her husband?" "somebody perfect, somebody else worth it." Patient states that "I'll write you a check, money come around and you get a ching-ching." Patient was asked if he had any further questions. 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 Patient UDS Clear and ETOH <5.   Consulted with Claudette Headonrad Withrow, DNP who recommends inpatient at this time.    Diagnosis: Schizophrenia  Past Medical History:  Past Medical History  Diagnosis Date  . Schizophrenia (HCC)     History reviewed. No pertinent past surgical history.  Family History: No family history on file.  Social History:  reports that he has been smoking Cigarettes.  He has a 4.5 pack-year smoking history. He does not have any smokeless tobacco history on file. He reports that he drinks about 1.2 oz of alcohol per week. He reports that he uses illicit drugs ("Crack" cocaine and Cocaine) about 5 times per week.  Additional Social History:  Alcohol / Drug Use Pain Medications: See PTA Prescriptions: See PTA Over the Counter: See PTA History of alcohol / drug use?:  (UTA)  CIWA: CIWA-Ar BP: 162/86 mmHg Pulse Rate: 96 COWS:    Allergies:  Allergies  Allergen Reactions  . Haldol [Haloperidol]     Pt reports he is allergic to injectable and PO haldol  . Risperidone And Related     Pt reports he is allergic to injectable and PO     Home Medications:  (Not in a hospital admission)  OB/GYN Status:  No LMP for male patient.  General Assessment Data [facility] of Assessment: [facility] T[facility] Assessment: In system Is this a Tele or Face-to-Face  Assessment?: Face-to-Face Is this an Initial Assessment or a Re-assessment for this encounter?: Initial Assessment Marital status:  (UTA) Is patient pregnant?: No Pregnancy Status: No Living Arrangements:  (UTA) Can pt return to current living arrangement?:  (UTA) Admission Status: Involuntary Is patient capable of signing voluntary admission?: No Referral Source: Other (Dr. Omelia Blackwater) Insurance type: Medicaid     Crisis Care Plan Living Arrangements:  (UTA) Name of Psychiatrist: Dr. Omelia Blackwater  [phone]) Name of Therapist: UTA  Education Status Is patient currently in school?:  (UTA) Current Grade:  (UTA) Highest grade of school patient has completed:  (UTA) Name of school:  (UTA) Contact person:  (UTA)  Risk to self with the past 6 months Suicidal Ideation:  (UTA) Has patient been a risk to self within the past 6 months prior to admission? :  (UTA) Suicidal Intent:  (UTA) Has patient had any suicidal intent within the past 6 months prior to admission? :  (UTA) Is patient at risk for suicide?:  (UTA) Suicidal Plan?:  (UTA) Has patient had any suicidal plan within the past 6 months prior to admission? :  (UTA) Access to Means:  (UTA) What has been your use of drugs/alcohol within the last 12 months?:  (UTA) Previous Attempts/Gestures:  (UTA) How many times?:  (UTA) Other Self Harm Risks:  (UTA) Triggers for Past Attempts:  (UTA) Intentional Self Injurious Behavior:  (UTA) Family Suicide History:  (UTA) Recent stressful life event(s):  (UTA) Persecutory voices/beliefs?:  (UTA) Depression:  (UTA) Depression Symptoms:  (UTA) Substance abuse history and/or treatment for substance abuse?: Yes Suicide prevention information given to non-admitted patients: Not applicable  Risk to Others within the past 6 months Homicidal Ideation:  (UTA) Does patient have any lifetime risk of violence toward others beyond the six months prior to admission? :  (UTA) Thoughts of Harm to Others:  (UTA) Current Homicidal Intent:  (UTA) Current Homicidal Plan:  (UTA) Access to Homicidal Means:  (UTA) Identified Victim:  (UTA) History of harm to others?:  (UTA) Assessment of Violence:  (UTA) Violent Behavior Description:  (UTA) Does patient have access to weapons?:  (UTA) Criminal Charges Pending?:  (UTA) Does patient have a court date:  (UTA) Is patient on probation?:  (UTA)  Psychosis Hallucinations:  (UTA) Delusions:  (UTA)  Mental Status Report Appearance/Hygiene: In  scrubs Eye Contact: Fair Motor Activity: Hyperactivity, Gestures Speech: Rapid, Pressured, Word salad Level of Consciousness: Alert Mood: Preoccupied Affect: Preoccupied Anxiety Level: Minimal Thought Processes: Flight of Ideas Judgement: Impaired Orientation: Not oriented Obsessive Compulsive Thoughts/Behaviors: Unable to Assess  Cognitive Functioning Concentration: Unable to Assess Memory: Unable to Assess IQ: Average Insight: Unable to Assess Impulse Control: Unable to Assess Appetite:  (UTA) Sleep: Unable to Assess Vegetative Symptoms: Unable to Assess  ADLScreening [facility] Assessment Services) Patient's cognitive ability adequate to safely complete daily activities?: Yes Patient able to express need for assistance with ADLs?: Yes Independently performs ADLs?: Yes (appropriate for developmental age)  Prior Inpatient Therapy Prior Inpatient Therapy:  (UTA) Prior Therapy Dates:  [facility]ining) Prior Therapy Facilty/Provider(s):  (UTA) Reason for Treatment:  (UTA)  Prior Outpatient Therapy Prior Outpatient Therapy:  (UTA) Prior Therapy Dates:  (UTA) Prior Therapy Facilty/Provider(s):  (UTA) Reason for Treatment:  (UTA) Does patient have an ACCT team?:  (UTA) Does patient have Intensive In-House Services?  :  (UTA) Does patient have [facility] services? :  (UTA) Does patient have P4CC services?:  (UTA)  ADL Screening (condition at time of admission) Patient's cognitive ability adequate to safely  complete daily activities?: Yes Is the patient deaf or have difficulty hearing?: No Does the patient have difficulty seeing, even when wearing glasses/contacts?: No Does the patient have difficulty concentrating, remembering, or making decisions?: No Patient able to express need for assistance with ADLs?: Yes Does the patient have difficulty dressing or bathing?: No Independently performs ADLs?: Yes (appropriate for developmental age) Does the patient have difficulty walking or climbing  stairs?: No Weakness of Legs: None Weakness of Arms/Hands: None  Home Assistive Devices/Equipment Home Assistive Devices/Equipment: None  Therapy Consults (therapy consults require a physician order) PT Evaluation Needed: No OT Evalulation Needed: No SLP Evaluation Needed: No Abuse/Neglect Assessment (Assessment to be complete while patient is alone) Physical Abuse:  (UTA) Verbal Abuse:  (UTA) Sexual Abuse:  (UTA) Exploitation of patient/patient's resources:  (UTA) Self-Neglect:  (UTA) Values / Beliefs Cultural Requests During Hospitalization:  (UTA) Spiritual Requests During Hospitalization:  (UTA)   Advance Directives (For Healthcare) Does patient have an advance directive?: No Would patient like information on creating an advanced directive?: No - patient declined information    Additional Information 1:1 In Past 12 Months?:  (UTA) CIRT Risk:  (UTA) Elopement Risk:  (UTA) Does patient have medical clearance?: Yes     Disposition:  Disposition Initial Assessment Completed for this Encounter: Yes Disposition of Patient: Inpatient treatment program (per Claudette Head, DNP) Type of inpatient treatment program: Adult (500)  On Site Evaluation by:   Reviewed with Physician:    Twanna Resh 02/01/2015 6:51 PM

## 2015-02-01 NOTE — ED Notes (Signed)
Per IVC papers, patient is using cocaine and not taking psyche meds-threatening staff at his day program

## 2015-02-02 DIAGNOSIS — F250 Schizoaffective disorder, bipolar type: Secondary | ICD-10-CM

## 2015-02-02 MED ORDER — NICOTINE 21 MG/24HR TD PT24
21.0000 mg | MEDICATED_PATCH | Freq: Every day | TRANSDERMAL | Status: DC
  Administered 2015-02-02: 21 mg via TRANSDERMAL

## 2015-02-02 MED ORDER — CLONAZEPAM 0.5 MG PO TABS
0.5000 mg | ORAL_TABLET | Freq: Two times a day (BID) | ORAL | Status: DC
  Administered 2015-02-02 – 2015-02-03 (×3): 0.5 mg via ORAL
  Filled 2015-02-02 (×3): qty 1

## 2015-02-02 MED ORDER — OLANZAPINE 10 MG PO TBDP
10.0000 mg | ORAL_TABLET | Freq: Three times a day (TID) | ORAL | Status: DC | PRN
  Administered 2015-02-03 – 2015-02-08 (×6): 10 mg via ORAL
  Filled 2015-02-02 (×6): qty 1

## 2015-02-02 MED ORDER — OLANZAPINE 10 MG PO TABS
10.0000 mg | ORAL_TABLET | Freq: Two times a day (BID) | ORAL | Status: DC
  Filled 2015-02-02: qty 1

## 2015-02-02 MED ORDER — OLANZAPINE 10 MG PO TABS
10.0000 mg | ORAL_TABLET | Freq: Two times a day (BID) | ORAL | Status: DC
  Administered 2015-02-02 – 2015-02-03 (×3): 10 mg via ORAL
  Filled 2015-02-02 (×2): qty 1

## 2015-02-02 NOTE — Consult Note (Signed)
[place] Psychiatry Consult   Reason for Consult:  Manic  Referring Physician:  EDP Patient Identification: Gregory Meza MRN:  [id number] Principal Diagnosis: Schizoaffective disorder, bipolar type Gregory Meza) Diagnosis:   Patient Active Problem List   Diagnosis Date Noted  . Schizoaffective disorder, bipolar type ([place]) [F25.0]     Priority: High  . Cocaine use disorder, moderate, dependence ([place]) [F14.20] 09/25/2014  . Cannabis use disorder, moderate, dependence ([place]) [F12.20] 09/25/2014  . Substance abuse [F19.10] 03/29/2012    Class: Chronic  . Non-compliant patient [Z91.19] 03/29/2012    Class: Chronic    Total Time spent with patient: 45 minutes  Subjective:   Gregory Meza is a 49 y.o. male patient admitted with manic behaviors.  HPI:  On admission: 49 y.o. male who presents [facility] via [facility] under IVC from Gregory Meza. IVC States:  Patient suffers from chronic schizophrenia. He is using cocaine on a daily basis and will not take oral medications. He is acutely psychotic and has threatened staff of day program and shown hostility toward his workers. He has been removed by the police twice in the past week. He cannot conduct himself safely in the community in his current mental state. He needs hospitalization for stabilization and proper disposition"  Patient speaking very rapidly and making clicking sounds with his mouth during the assessment. Patient states that his name is Gregory Meza. Patient states that he lives alone and does not have a psychiatrist. Patient states that he is here because he just graduated from [facility] with "two masters in [employer] and psychology and I got a 69 in both of 'em so they sent me here to save lives, white lives, black lives matter, all lives." Patient had flight of ideas and stated the following, "sometimes I got bipolar disorder, it gives me a headache.: "Close your eyes and everything will stay in place except me (closes  eyes) you watching, look, I'm moving." When asked if he wanted to kill himself patient states "If you got a question mark, I ain't got shit to tell you." Patient asked this writer "do you know what a punctuation mark is? What it look like? What it mean?" Patient starts to refer to this writer as "Gregory Meza" and states that he remembers [place]. He states "Gregory Meza, my fiancee, she's half possum and half dragon, call her, if she cheating on me then who her husband?" "somebody perfect, somebody else worth it." Patient states that "I'll write you a check, money come around and you get a ching-ching." Patient was asked if he had any further questions. 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 Patient UDS Clear and ETOH <5.   Today:  Patient remains manic, tangential, pressured speech.  He does not want "shots just pills".  Pleasantly manic.    Past Psychiatric History:  Schizoaffective disorder, bipolar disorder; cocaine abuse  Risk to Self: Suicidal Ideation:  (UTA) Suicidal Intent:  (UTA) Is patient at risk for suicide?:  (UTA) Suicidal Meza?:  (UTA) Access to Means:  (UTA) What has been your use of drugs/alcohol within the last 12 months?:  (UTA) How many times?:  (UTA) Other Self Harm Risks:  (UTA) Triggers for Past Attempts:  (UTA) Intentional Self Injurious Behavior:  (UTA) Risk to Others: Homicidal Ideation:  (UTA) Thoughts of Harm  to Others:  (UTA) Current Homicidal Intent:  (UTA) Current Homicidal Meza:  (UTA) Access to Homicidal Means:  (UTA) Identified Victim:  (UTA) History of harm to others?:  (UTA) Assessment of Violence:  (UTA) Violent Behavior Description:  (UTA) Does patient have access to weapons?:  (UTA) Criminal Charges Pending?:  (UTA) Does patient have a court date:  Special educational needs teacher) Prior Inpatient  Therapy: Prior Inpatient Therapy:  (UTA) Prior Therapy Dates:  (UTA) Prior Therapy Facilty/Provider(s):  (UTA) Reason for Treatment:  (UTA) Prior Outpatient Therapy: Prior Outpatient Therapy:  (UTA) Prior Therapy Dates:  (UTA) Prior Therapy Facilty/Provider(s):  (UTA) Reason for Treatment:  (UTA) Does patient have an ACCT team?:  (UTA) Does patient have Intensive In-House Services?  :  (UTA) Does patient have [facility] services? :  (UTA) Does patient have P4CC services?:  (UTA)  Past Medical History:  Past Medical History  Diagnosis Date  . Schizophrenia ([place])    History reviewed. No pertinent past surgical history. Family History: No family history on file. Family Psychiatric  History: Unknown Social History:  History  Alcohol Use  . 1.2 oz/week  . 1 Glasses of wine, 1 Cans of beer per week     History  Drug Use  . 5.00 per week  . Special: "Crack" cocaine, Cocaine    Social History   Social History  . Marital Status: Single    Spouse Name: N/A  . Number of Children: N/A  . Years of Education: N/A   Social History Main Topics  . Smoking status: Current Every Day Smoker -- 1.50 packs/day for 3 years    Types: Cigarettes  . Smokeless tobacco: None  . Alcohol Use: 1.2 oz/week    1 Glasses of wine, 1 Cans of beer per week  . Drug Use: 5.00 per week    Special: "Crack" cocaine, Cocaine  . Sexual Activity: Not Currently   Other Topics Concern  . None   Social History Narrative   Additional Social History:    Pain Medications: See PTA Prescriptions: See PTA Over the Counter: See PTA History of alcohol / drug use?:  (UTA)                     Allergies:   Allergies  Allergen Reactions  . Haldol [Haloperidol]     Pt reports he is allergic to injectable and PO haldol  . Risperidone And Related     Pt reports he is allergic to injectable and PO     Labs:  Results for orders placed or performed during the Meza encounter of 02/01/15 (from the past  48 hour(s))  Comprehensive metabolic panel     Status: Abnormal   Collection Time: 02/01/15  3:08 PM  Result Value Ref Range   Sodium 135 135 - 145 mmol/L   Potassium 3.7 3.5 - 5.1 mmol/L   Chloride 103 101 - 111 mmol/L   CO2 25 22 - 32 mmol/L   Glucose, Bld 95 65 - 99 mg/dL   BUN 8 6 - 20 mg/dL   Creatinine, Ser 0.70 0.61 - 1.24 mg/dL   Calcium 8.8 (L) 8.9 - 10.3 mg/dL   Total Protein 7.5 6.5 - 8.1 g/dL   Albumin 4.4 3.5 - 5.0 g/dL   AST 44 (H) 15 - 41 U/L   ALT 35 17 - 63 U/L   Alkaline Phosphatase 74 38 - 126 U/L   Total Bilirubin 0.5 0.3 - 1.2 mg/dL   GFR calc non Af Amer >60 >  60 mL/min   GFR calc Af Amer >60 >60 mL/min    Comment: (NOTE) The eGFR has been calculated using the CKD EPI equation. This calculation has not been validated in all clinical situations. eGFR's persistently <60 mL/min signify possible Chronic Kidney Disease.    Anion gap 7 5 - 15  CBC with Diff     Status: None   Collection Time: 02/01/15  3:08 PM  Result Value Ref Range   WBC 7.6 4.0 - 10.5 K/uL   RBC 4.55 4.22 - 5.81 MIL/uL   Hemoglobin 13.7 13.0 - 17.0 g/dL   HCT 39.5 39.0 - 52.0 %   MCV 86.8 78.0 - 100.0 fL   MCH 30.1 26.0 - 34.0 pg   MCHC 34.7 30.0 - 36.0 g/dL   RDW 13.9 11.5 - 15.5 %   Platelets 259 150 - 400 K/uL   Neutrophils Relative % 51 %   Neutro Abs 3.9 1.7 - 7.7 K/uL   Lymphocytes Relative 35 %   Lymphs Abs 2.7 0.7 - 4.0 K/uL   Monocytes Relative 9 %   Monocytes Absolute 0.7 0.1 - 1.0 K/uL   Eosinophils Relative 5 %   Eosinophils Absolute 0.4 0.0 - 0.7 K/uL   Basophils Relative 0 %   Basophils Absolute 0.0 0.0 - 0.1 K/uL  Ethanol     Status: None   Collection Time: 02/01/15  3:10 PM  Result Value Ref Range   Alcohol, Ethyl (B) <5 <5 mg/dL    Comment:        LOWEST DETECTABLE LIMIT FOR SERUM ALCOHOL IS 5 mg/dL FOR MEDICAL PURPOSES ONLY   Salicylate level     Status: None   Collection Time: 02/01/15  3:10 PM  Result Value Ref Range   Salicylate Lvl <7.1 2.8 - 30.0  mg/dL  Acetaminophen level     Status: Abnormal   Collection Time: 02/01/15  3:10 PM  Result Value Ref Range   Acetaminophen (Tylenol), Serum <10 (L) 10 - 30 ug/mL    Comment:        THERAPEUTIC CONCENTRATIONS VARY SIGNIFICANTLY. A RANGE OF 10-30 ug/mL MAY BE AN EFFECTIVE CONCENTRATION FOR MANY PATIENTS. HOWEVER, SOME ARE BEST TREATED AT CONCENTRATIONS OUTSIDE THIS RANGE. ACETAMINOPHEN CONCENTRATIONS >150 ug/mL AT 4 HOURS AFTER INGESTION AND >50 ug/mL AT 12 HOURS AFTER INGESTION ARE OFTEN ASSOCIATED WITH TOXIC REACTIONS.   Valproic acid level     Status: Abnormal   Collection Time: 02/01/15  3:10 PM  Result Value Ref Range   Valproic Acid Lvl <10 (L) 50.0 - 100.0 ug/mL    Comment: RESULTS CONFIRMED BY MANUAL DILUTION  Urine rapid drug screen (hosp performed)not at [facility]     Status: None   Collection Time: 02/01/15  3:30 PM  Result Value Ref Range   Opiates NONE DETECTED NONE DETECTED   Cocaine NONE DETECTED NONE DETECTED   Benzodiazepines NONE DETECTED NONE DETECTED   Amphetamines NONE DETECTED NONE DETECTED   Tetrahydrocannabinol NONE DETECTED NONE DETECTED   Barbiturates NONE DETECTED NONE DETECTED    Comment:        DRUG SCREEN FOR MEDICAL PURPOSES ONLY.  IF CONFIRMATION IS NEEDED FOR ANY PURPOSE, NOTIFY LAB WITHIN 5 DAYS.        LOWEST DETECTABLE LIMITS FOR URINE DRUG SCREEN Drug Class       Cutoff (ng/mL) Amphetamine      1000 Barbiturate      200 Benzodiazepine   696 Tricyclics       789 Opiates  300 Cocaine          300 THC              50   Urinalysis, Routine w reflex microscopic (not at [facility])     Status: None   Collection Time: 02/01/15  3:31 PM  Result Value Ref Range   Color, Urine YELLOW YELLOW   APPearance CLEAR CLEAR   Specific Gravity, Urine 1.016 1.005 - 1.030   pH 6.0 5.0 - 8.0   Glucose, UA NEGATIVE NEGATIVE mg/dL   Hgb urine dipstick NEGATIVE NEGATIVE   Bilirubin Urine NEGATIVE NEGATIVE   Ketones, ur NEGATIVE NEGATIVE mg/dL    Protein, ur NEGATIVE NEGATIVE mg/dL   Nitrite NEGATIVE NEGATIVE   Leukocytes, UA NEGATIVE NEGATIVE    Comment: MICROSCOPIC NOT DONE ON URINES WITH NEGATIVE PROTEIN, BLOOD, LEUKOCYTES, NITRITE, OR GLUCOSE <1000 mg/dL.    Current Facility-Administered Medications  Medication Dose Route Frequency Provider Last Rate Last Dose  . acetaminophen (TYLENOL) tablet 650 mg  650 mg Oral Q4H PRN Daleen Bo, MD      . alum & mag hydroxide-simeth (MAALOX/MYLANTA) 200-200-20 MG/5ML suspension 30 mL  30 mL Oral PRN Daleen Bo, MD      . clonazePAM Bobbye [place]) tablet 0.5 mg  0.5 mg Oral BID Retina Bernardy      . ibuprofen (ADVIL,MOTRIN) tablet 600 mg  600 mg Oral Q8H PRN Daleen Bo, MD      . nicotine (NICODERM CQ - dosed in mg/24 hours) patch 21 mg  21 mg Transdermal Daily PRN Daleen Bo, MD      . OLANZapine (ZYPREXA) tablet 10 mg  10 mg Oral BID PC Vaishnavi Dalby      . OLANZapine zydis (ZYPREXA) disintegrating tablet 10 mg  10 mg Oral Q8H PRN Tamaj Jurgens      . ondansetron (ZOFRAN) tablet 4 mg  4 mg Oral Q8H PRN Daleen Bo, MD      . ziprasidone (GEODON) injection 20 mg  20 mg Intramuscular Once Daleen Bo, MD   20 mg at 02/01/15 [id number]   Current Outpatient Prescriptions  Medication Sig Dispense Refill  . divalproex (DEPAKOTE) 500 MG DR tablet Take 2 tablets (1,000 mg total) by mouth at bedtime. 60 tablet 0  . Multiple Vitamin (MULTIVITAMIN WITH MINERALS) TABS tablet Take 1 tablet by mouth daily.    Marland Kitchen OLANZapine zydis (ZYPREXA) 20 MG disintegrating tablet Take 1 tablet (20 mg total) by mouth at bedtime. 30 tablet 0  . traZODone (DESYREL) 100 MG tablet Take 1 tablet (100 mg total) by mouth at bedtime. 30 tablet 0  . trihexyphenidyl (ARTANE) 2 MG tablet Take 1 tablet (2 mg total) by mouth at bedtime. 30 tablet 0    Musculoskeletal: Strength & Muscle Tone: within normal limits Gait & Station: normal Patient leans: N/A  Psychiatric Specialty Exam: Review of Systems   Constitutional: Negative.   HENT: Negative.   Eyes: Negative.   Respiratory: Negative.   Cardiovascular: Negative.   Gastrointestinal: Negative.   Genitourinary: Negative.   Musculoskeletal: Negative.   Skin: Negative.   Neurological: Negative.   Endo/Heme/Allergies: Negative.   Psychiatric/Behavioral: The patient is nervous/anxious.        Manic symptoms    Blood pressure 124/88, pulse 101, temperature 97.8 F (36.6 C), temperature source Oral, resp. rate 18, SpO2 97 %.There is no weight on file to calculate BMI.  General Appearance: Disheveled  Eye Contact::  Fair  Speech:  Pressured  Volume:  Increased at times  Mood:  Anxious and Euphoric  Affect:  Blunt  Thought Process:  Irrelevant and Tangential  Orientation:  Full (Time, Place, and Person)  Thought Content:  Delusions, grandiose  Suicidal Thoughts:  No  Homicidal Thoughts:  No  Memory:  Immediate;   Fair Recent;   Fair Remote;   Fair  Judgement:  Impaired  Insight:  Lacking  Psychomotor Activity:  Increased  Concentration:  Poor  Recall:  [place] of Knowledge:Fair  Language: Good  Akathisia:  No  Handed:  Right  AIMS (if indicated):     Assets:  Leisure Time Physical Health Resilience Social Support  ADL's:  Intact  Cognition: Impaired,  Mild  Sleep:      Treatment Meza Summary: Daily contact with patient to assess and evaluate symptoms and progress in treatment, Medication management and Meza schizoaffective disorder, bipolar type: -Crisis stabilization -Medication management:  Klonopin 0.5 mg BID for anxiety and Zyprexa 10 mg BID for mania started; Zyprexa 10 mg every 8 hours PRN mania -Individual counseling  Disposition: Recommend psychiatric Inpatient admission when medically cleared.  Waylan Boga, [place] 02/02/2015 10:17 AM Patient seen face-to-face for psychiatric evaluation, chart reviewed and case discussed with the physician extender and developed treatment Meza. Reviewed the  information documented and agree with the treatment Meza. Corena Pilgrim, MD

## 2015-02-02 NOTE — ED Notes (Signed)
Up in hall appears to be trying to catch something on the floor and talking to someone unseen

## 2015-02-02 NOTE — ED Notes (Signed)
Pt. Noted sleeping in room. No complaints or concerns voiced. No distress or abnormal behavior noted. Will continue to monitor with security cameras. Q 15 minute rounds continue. 

## 2015-02-02 NOTE — Progress Notes (Signed)
10:39amNonnie Done. Dante Woodard, DSS guardian [phone]), called to check in on patient. DSS has been guardian since 2015 after pt had a series of [facility] hospitalizations. She reports that he has become increasingly manic, which she believes was triggered by him getting some money in the last few days. She says that whatever money he has he spends on drugs, and then stops taking his psychiatric medication. Yesterday, he was talking rapidly, wearing jewelry, was not clean shaven (normally he is) and was making incoherent statements and clicking sounds--all signs for pt that he is off baseline.  Pt was moved into an apartment in June/July 2016 by the state in-reach program. Prior to that he lived in a group home. He was doing well at first in the apartment, but Clydene PughWoodard reports that he "fell back in with his old crowd" and started doing drugs again, and has decompensated over the past two months. He attends [facility]. His psychiatrist is Dr. Omelia BlackwaterHeaden. He does not have an ACT team, since he attends a PSR program and a client cannot have both an ACT team and attend PSR. At his PSR this week, he has threatened staff twice. In addition, he has threatened his landlord, who is evicting him effective January 1st.   DSS would like to place him back into a group home. They are asking for assistance at least with an FL2 form, as they are unable to get pt to go to the doctor at this time. They have not identified any specific homes--they just know he is [facility] due to past history with that facility. DSS advocating for pt to receive inpatient tx for stabilization.  York SpanielAlexandra Raeanne [facility]WA Clinical Social Worker [facility] phone: [phone]

## 2015-02-02 NOTE — ED Notes (Addendum)
In the dayroom watching tv, talking and using hand gestures

## 2015-02-02 NOTE — ED Notes (Signed)
Up walking in the hall, speech rapid, presurred and difficult to understand what he is saying but talk seems to have a sexual  Content.  Pt back to his room

## 2015-02-02 NOTE — ED Notes (Signed)
In the hall talking w/ other pt

## 2015-02-02 NOTE — ED Notes (Signed)
Eating supper.

## 2015-02-02 NOTE — ED Notes (Signed)
In bathroom

## 2015-02-02 NOTE — ED Notes (Signed)
Pt. Noted in room. No complaints or concerns voiced. No acute distress noted. Will continue to monitor with security cameras. Q 15 minute rounds continue. 

## 2015-02-02 NOTE — ED Notes (Signed)
Pharmacy tech into see 

## 2015-02-02 NOTE — ED Notes (Signed)
Dr A and Jamison DNP into see 

## 2015-02-02 NOTE — ED Notes (Signed)
On the phone 

## 2015-02-02 NOTE — ED Notes (Signed)
Pt. Repeatedly pulling code alarm. Redirection attempted.

## 2015-02-02 NOTE — ED Notes (Addendum)
Mitchel [name] supervisor DSS called- reports that DSS is his guardian and that he has been escalating for sometime.  She reports that yesterday his worker found him dancing outside in the cold w/ thermals and shorts on.  She reports that he is in the reach program and is in the transition program.  She reports that this is the second time he has tried to transition and has not been able to, and is being evicted because of his behaviors, uses cocaine and pot.

## 2015-02-02 NOTE — Progress Notes (Addendum)
11:14am. CSW updated guardian [phone]) on psychiatric disposition--inpatient treatment for stabilization. Ms. Clydene PughWoodard in agreement. In addition, Ms. Woordard and [facility] pharmacy tech informed CSW that pt was due for invega shot yesterday. Ms. Clydene PughWoodard also informed CSW that when pt is discharged/stablized, she has located a group home provider for him: Mr. Remo [facility]vin [facility] [place], [phone].   11:48am. Pharmacy tech informed CSW that Dr. Lilia ProHeaden's office is closed for the weekend, and we are unable to confirm his invega dose. Pt's pharmacy also closed and guardian unsure of exact invega dose. Dr. Omelia BlackwaterHeaden can be contacted on Monday at [phone] to confirm dose.  York SpanielAlexandra Kymoni Monday [facility]WA Clinical Social Worker [facility] phone: [phone]

## 2015-02-02 NOTE — ED Notes (Signed)
Pt. Noted in hall responding to internal stimuli. No complaints or concerns voiced. No acute distress noted. Will continue to monitor with security cameras. Q 15 minute rounds continue.

## 2015-02-02 NOTE — ED Notes (Signed)
In dayroom talking/watching tv/hand gestures

## 2015-02-02 NOTE — ED Notes (Signed)
Pt. Noted in hall. No complaints or concerns voiced. No acute distress noted. Will continue to monitor with security cameras. Q 15 minute rounds continue. 

## 2015-02-02 NOTE — ED Notes (Signed)
Talking w/. [employer] officer 

## 2015-02-02 NOTE — ED Notes (Signed)
Up to the bathroom 

## 2015-02-02 NOTE — ED Notes (Signed)
Pt. Up walking around unit, talking loudly, demanding food and a shower. Pt. Redirected with minimal effect.

## 2015-02-02 NOTE — ED Notes (Signed)
Report received from Gregory Rambo Gregory Meza. Pt. Alert in no acute distress denies SI, HI, AVH and pain.  Pt. Instructed to come to me with problems or concerns.Will continue to monitor for safety via security cameras and Q 15 minute checks.  

## 2015-02-02 NOTE — ED Notes (Addendum)
May repeat zyprexa in 4 hours if needed VORB Dr AMervyn Skeeters

## 2015-02-02 NOTE — ED Notes (Signed)
Pt. Noted in rest room. No complaints or concerns voiced. No distress or abnormal behavior noted. Will continue to monitor with security cameras. Q 15 minute rounds continue.  

## 2015-02-03 DIAGNOSIS — F250 Schizoaffective disorder, bipolar type: Secondary | ICD-10-CM | POA: Diagnosis not present

## 2015-02-03 MED ORDER — ASENAPINE MALEATE 5 MG SL SUBL
10.0000 mg | SUBLINGUAL_TABLET | Freq: Once | SUBLINGUAL | Status: AC
  Administered 2015-02-03: 10 mg via SUBLINGUAL
  Filled 2015-02-03: qty 2

## 2015-02-03 MED ORDER — OLANZAPINE 5 MG PO TABS
15.0000 mg | ORAL_TABLET | Freq: Two times a day (BID) | ORAL | Status: DC
  Administered 2015-02-03 – 2015-02-08 (×10): 15 mg via ORAL
  Filled 2015-02-03 (×10): qty 1

## 2015-02-03 MED ORDER — CLONAZEPAM 1 MG PO TABS
1.0000 mg | ORAL_TABLET | Freq: Two times a day (BID) | ORAL | Status: DC
  Administered 2015-02-03 – 2015-02-08 (×5): 1 mg via ORAL
  Filled 2015-02-03 (×5): qty 1

## 2015-02-03 MED ORDER — OLANZAPINE 5 MG PO TABS: 15.0000 mg | ORAL_TABLET | Freq: Two times a day (BID) | ORAL | Status: DC

## 2015-02-03 NOTE — ED Notes (Signed)
Pt. Noted sleeping in room. No complaints or concerns voiced. No distress or abnormal behavior noted. Will continue to monitor with security cameras. Q 15 minute rounds continue. 

## 2015-02-03 NOTE — ED Notes (Signed)
Pt. Becomes agitated and cursing loudly when asked to go back to his room since it's still early.

## 2015-02-03 NOTE — ED Notes (Signed)
[facility] prior to presenting patient for admisssion.  Patient declined EKG.

## 2015-02-03 NOTE — Consult Note (Signed)
[facility] Consult   Reason for Consult:  Manic  Referring Physician:  EDP Patient Identification: Gregory Meza MRN:  [id number] Principal Diagnosis: Schizoaffective disorder, bipolar type [facility]) Diagnosis:   Patient Active Problem List   Diagnosis Date Noted  . Schizoaffective disorder, bipolar type ([place]) [F25.0]     Priority: High  . Cocaine use disorder, moderate, dependence ([place]) [F14.20] 09/25/2014  . Cannabis use disorder, moderate, dependence ([place]) [F12.20] 09/25/2014  . Substance abuse [F19.10] 03/29/2012    Class: Chronic  . Non-compliant patient [Z91.19] 03/29/2012    Class: Chronic    Total Time spent with patient: 30 minutes  Subjective:   Gregory Meza is a 49 y.o. male patient admitted with manic behaviors.  HPI:  On admission: 49 y.o. male who [facility] to [facility] via [employer] under IVC from Dr. Alexander Bergeron. IVC States:  Patient suffers from chronic schizophrenia. He is using cocaine on a daily basis and will not take oral medications. He is acutely psychotic and has threatened staff of day program and shown hostility toward his workers. He has been removed by the police twice in the past week. He cannot conduct himself safely in the community in his current mental state. He needs hospitalization for stabilization and proper disposition"  Patient speaking very rapidly and making clicking sounds with his mouth during the assessment. Patient states that his name is Gregory Meza. Patient states that he lives alone and does not have a psychiatrist. Patient states that he is here because he just graduated from [employer] with "two masters in [employer] and psychology and I got a 55 in both of 'em so they sent me here to save lives, white lives, black lives matter, all lives." Patient had flight of ideas and stated the following, "sometimes I got bipolar disorder, it gives me a headache.: "Close your eyes and everything will stay in place except me  (closes eyes) you watching, look, I'm moving." When asked if he wanted to kill himself patient states "If you got a question mark, I ain't got shit to tell you." Patient asked this writer "do you know what a punctuation mark is? What it look like? What it mean?" Patient starts to refer to this writer as "Floreen Comber" and states that he remembers [place]. He states "[name], my fiancee, she's half possum and half dragon, call her, if she cheating on me then who her husband?" "somebody perfect, somebody else worth it." Patient states that "I'll write you a check, money come around and you get a ching-ching." Patient was asked if he had any further questions. 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 Patient UDS Clear and ETOH <5.   Today:  Patient remains manic, tangential, pressured speech.  Irritable, lability of mood, easily upset, hyperverbal.  Medications adjusted.  Past Psychiatric History:  Schizoaffective disorder, bipolar disorder; cocaine abuse  Risk to Self: Suicidal Ideation:  (UTA) Suicidal Intent:  (UTA) Is patient at risk for suicide?:  (UTA) Suicidal Meza?:  (UTA) Access to Means:  (UTA) What has been your use of drugs/alcohol within the last 12 months?:  (UTA) How many times?:  (UTA) Other Self Harm Risks:  (UTA) Triggers for Past Attempts:  (UTA) Intentional Self Injurious Behavior:  (UTA) Risk to Others: Homicidal Ideation:  (UTA) Thoughts of Harm to Others:  (  UTA) Current Homicidal Intent:  (UTA) Current Homicidal Meza:  (UTA) Access to Homicidal Means:  (UTA) Identified Victim:  (UTA) History of harm to others?:  (UTA) Assessment of Violence:  (UTA) Violent Behavior Description:  (UTA) Does patient have access to weapons?:  (UTA) Criminal Charges Pending?:  (UTA) Does patient have a court  date:  Special educational needs teacher) Prior Inpatient Therapy: Prior Inpatient Therapy:  (UTA) Prior Therapy Dates:  (UTA) Prior Therapy Facilty/Provider(s):  (UTA) Reason for Treatment:  (UTA) Prior Outpatient Therapy: Prior Outpatient Therapy:  (UTA) Prior Therapy Dates:  (UTA) Prior Therapy Facilty/Provider(s):  (UTA) Reason for Treatment:  (UTA) Does patient have an ACCT team?:  (UTA) Does patient have Intensive In-House Services?  :  (UTA) Does patient have [facility] services? :  (UTA) Does patient have P4CC services?:  (UTA)  Past Medical History:  Past Medical History  Diagnosis Date  . Schizophrenia ([place])    History reviewed. No pertinent past surgical history. Family History: No family history on file. Family Psychiatric  History: Unknown Social History:  History  Alcohol Use  . 1.2 oz/week  . 1 Glasses of wine, 1 Cans of beer per week     History  Drug Use  . 5.00 per week  . Special: "Crack" cocaine, Cocaine    Social History   Social History  . Marital Status: Single    Spouse Name: N/A  . Number of Children: N/A  . Years of Education: N/A   Social History Main Topics  . Smoking status: Current Every Day Smoker -- 1.50 packs/day for 3 years    Types: Cigarettes  . Smokeless tobacco: None  . Alcohol Use: 1.2 oz/week    1 Glasses of wine, 1 Cans of beer per week  . Drug Use: 5.00 per week    Special: "Crack" cocaine, Cocaine  . Sexual Activity: Not Currently   Other Topics Concern  . None   Social History Narrative   Additional Social History:    Pain Medications: See PTA Prescriptions: See PTA Over the Counter: See PTA History of alcohol / drug use?:  (UTA)                     Allergies:   Allergies  Allergen Reactions  . Haldol [Haloperidol]     Pt reports he is allergic to injectable and PO haldol  . Risperidone And Related     Pt reports he is allergic to injectable and PO     Labs:  Results for orders placed or performed during the hospital  encounter of 02/01/15 (from the past 48 hour(s))  Comprehensive metabolic panel     Status: Abnormal   Collection Time: 02/01/15  3:08 PM  Result Value Ref Range   Sodium 135 135 - 145 mmol/L   Potassium 3.7 3.5 - 5.1 mmol/L   Chloride 103 101 - 111 mmol/L   CO2 25 22 - 32 mmol/L   Glucose, Bld 95 65 - 99 mg/dL   BUN 8 6 - 20 mg/dL   Creatinine, Ser 0.70 0.61 - 1.24 mg/dL   Calcium 8.8 (L) 8.9 - 10.3 mg/dL   Total Protein 7.5 6.5 - 8.1 g/dL   Albumin 4.4 3.5 - 5.0 g/dL   AST 44 (H) 15 - 41 U/L   ALT 35 17 - 63 U/L   Alkaline Phosphatase 74 38 - 126 U/L   Total Bilirubin 0.5 0.3 - 1.2 mg/dL   GFR calc non Af Amer >60 >60 mL/min  GFR calc Af Amer >60 >60 mL/min    Comment: (NOTE) The eGFR has been calculated using the CKD EPI equation. This calculation has not been validated in all clinical situations. eGFR's persistently <60 mL/min signify possible Chronic Kidney Disease.    Anion gap 7 5 - 15  CBC with Diff     Status: None   Collection Time: 02/01/15  3:08 PM  Result Value Ref Range   WBC 7.6 4.0 - 10.5 K/uL   RBC 4.55 4.22 - 5.81 MIL/uL   Hemoglobin 13.7 13.0 - 17.0 g/dL   HCT 39.5 39.0 - 52.0 %   MCV 86.8 78.0 - 100.0 fL   MCH 30.1 26.0 - 34.0 pg   MCHC 34.7 30.0 - 36.0 g/dL   RDW 13.9 11.5 - 15.5 %   Platelets 259 150 - 400 K/uL   Neutrophils Relative % 51 %   Neutro Abs 3.9 1.7 - 7.7 K/uL   Lymphocytes Relative 35 %   Lymphs Abs 2.7 0.7 - 4.0 K/uL   Monocytes Relative 9 %   Monocytes Absolute 0.7 0.1 - 1.0 K/uL   Eosinophils Relative 5 %   Eosinophils Absolute 0.4 0.0 - 0.7 K/uL   Basophils Relative 0 %   Basophils Absolute 0.0 0.0 - 0.1 K/uL  Ethanol     Status: None   Collection Time: 02/01/15  3:10 PM  Result Value Ref Range   Alcohol, Ethyl (B) <5 <5 mg/dL    Comment:        LOWEST DETECTABLE LIMIT FOR SERUM ALCOHOL IS 5 mg/dL FOR MEDICAL PURPOSES ONLY   Salicylate level     Status: None   Collection Time: 02/01/15  3:10 PM  Result Value Ref Range    Salicylate Lvl <3.4 2.8 - 30.0 mg/dL  Acetaminophen level     Status: Abnormal   Collection Time: 02/01/15  3:10 PM  Result Value Ref Range   Acetaminophen (Tylenol), Serum <10 (L) 10 - 30 ug/mL    Comment:        THERAPEUTIC CONCENTRATIONS VARY SIGNIFICANTLY. A RANGE OF 10-30 ug/mL MAY BE AN EFFECTIVE CONCENTRATION FOR MANY PATIENTS. HOWEVER, SOME ARE BEST TREATED AT CONCENTRATIONS OUTSIDE THIS RANGE. ACETAMINOPHEN CONCENTRATIONS >150 ug/mL AT 4 HOURS AFTER INGESTION AND >50 ug/mL AT 12 HOURS AFTER INGESTION ARE OFTEN ASSOCIATED WITH TOXIC REACTIONS.   Valproic acid level     Status: Abnormal   Collection Time: 02/01/15  3:10 PM  Result Value Ref Range   Valproic Acid Lvl <10 (L) 50.0 - 100.0 ug/mL    Comment: RESULTS CONFIRMED BY MANUAL DILUTION  Urine rapid drug screen (hosp performed)not at [facility]     Status: None   Collection Time: 02/01/15  3:30 PM  Result Value Ref Range   Opiates NONE DETECTED NONE DETECTED   Cocaine NONE DETECTED NONE DETECTED   Benzodiazepines NONE DETECTED NONE DETECTED   Amphetamines NONE DETECTED NONE DETECTED   Tetrahydrocannabinol NONE DETECTED NONE DETECTED   Barbiturates NONE DETECTED NONE DETECTED    Comment:        DRUG SCREEN FOR MEDICAL PURPOSES ONLY.  IF CONFIRMATION IS NEEDED FOR ANY PURPOSE, NOTIFY LAB WITHIN 5 DAYS.        LOWEST DETECTABLE LIMITS FOR URINE DRUG SCREEN Drug Class       Cutoff (ng/mL) Amphetamine      1000 Barbiturate      200 Benzodiazepine   917 Tricyclics       915 Opiates  300 Cocaine          300 THC              50   Urinalysis, Routine w reflex microscopic (not at [facility])     Status: None   Collection Time: 02/01/15  3:31 PM  Result Value Ref Range   Color, Urine YELLOW YELLOW   APPearance CLEAR CLEAR   Specific Gravity, Urine 1.016 1.005 - 1.030   pH 6.0 5.0 - 8.0   Glucose, UA NEGATIVE NEGATIVE mg/dL   Hgb urine dipstick NEGATIVE NEGATIVE   Bilirubin Urine NEGATIVE NEGATIVE    Ketones, ur NEGATIVE NEGATIVE mg/dL   Protein, ur NEGATIVE NEGATIVE mg/dL   Nitrite NEGATIVE NEGATIVE   Leukocytes, UA NEGATIVE NEGATIVE    Comment: MICROSCOPIC NOT DONE ON URINES WITH NEGATIVE PROTEIN, BLOOD, LEUKOCYTES, NITRITE, OR GLUCOSE <1000 mg/dL.    Current Facility-Administered Medications  Medication Dose Route Frequency Provider Last Rate Last Dose  . acetaminophen (TYLENOL) tablet 650 mg  650 mg Oral Q4H PRN Daleen Bo, MD      . alum & mag hydroxide-simeth (MAALOX/MYLANTA) 200-200-20 MG/5ML suspension 30 mL  30 mL Oral PRN Daleen Bo, MD      . asenapine (SAPHRIS) sublingual tablet 10 mg  10 mg Sublingual Once Iosefa Weintraub      . clonazePAM (KLONOPIN) tablet 1 mg  1 mg Oral BID Manie Bealer      . ibuprofen (ADVIL,MOTRIN) tablet 600 mg  600 mg Oral Q8H PRN Daleen Bo, MD      . nicotine (NICODERM CQ - dosed in mg/24 hours) patch 21 mg  21 mg Transdermal Daily PRN Daleen Bo, MD      . OLANZapine (ZYPREXA) tablet 15 mg  15 mg Oral BID PC Maryl Blalock      . OLANZapine zydis (ZYPREXA) disintegrating tablet 10 mg  10 mg Oral Q8H PRN Malaiah Viramontes   10 mg at 02/03/15 1026  . ondansetron (ZOFRAN) tablet 4 mg  4 mg Oral Q8H PRN Daleen Bo, MD      . ziprasidone (GEODON) injection 20 mg  20 mg Intramuscular Once Daleen Bo, MD   20 mg at 02/01/15 1950   Current Outpatient Prescriptions  Medication Sig Dispense Refill  . Pseudoephedrine HCl (SUDAFED PO) Take 1 tablet by mouth 2 (two) times daily as needed (allergies).    . divalproex (DEPAKOTE) 500 MG DR tablet Take 2 tablets (1,000 mg total) by mouth at bedtime. 60 tablet 0  . Multiple Vitamin (MULTIVITAMIN WITH MINERALS) TABS tablet Take 1 tablet by mouth daily.    Marland Kitchen OLANZapine zydis (ZYPREXA) 20 MG disintegrating tablet Take 1 tablet (20 mg total) by mouth at bedtime. 30 tablet 0  . traZODone (DESYREL) 100 MG tablet Take 1 tablet (100 mg total) by mouth at bedtime. 30 tablet 0  . trihexyphenidyl  (ARTANE) 2 MG tablet Take 1 tablet (2 mg total) by mouth at bedtime. 30 tablet 0    Musculoskeletal: Strength & Muscle Tone: within normal limits Gait & Station: normal Patient leans: N/A  Psychiatric Specialty Exam: Review of Systems  Constitutional: Negative.   HENT: Negative.   Eyes: Negative.   Respiratory: Negative.   Cardiovascular: Negative.   Gastrointestinal: Negative.   Genitourinary: Negative.   Musculoskeletal: Negative.   Skin: Negative.   Neurological: Negative.   Endo/Heme/Allergies: Negative.   Psychiatric/Behavioral: The patient is nervous/anxious.        Manic symptoms    Blood pressure 127/72, pulse 88, temperature  98.3 F (36.8 C), temperature source Oral, resp. rate 20, SpO2 100 %.There is no weight on file to calculate BMI.  General Appearance: Disheveled  Eye Sport and exercise psychologist::  Fair  Speech:  Pressured, hyperverbal  Volume:  Increased at times  Mood:  Anxious, irritable, angry at times, labile  Affect:  Blunt  Thought Process:  Irrelevant and Tangential  Orientation:  Full (Time, Place, and Person)  Thought Content:  Delusions, grandiose  Suicidal Thoughts:  No  Homicidal Thoughts:  No  Memory:  Immediate;   Fair Recent;   Fair Remote;   Fair  Judgement:  Impaired  Insight:  Lacking  Psychomotor Activity:  Increased  Concentration:  Poor  Recall:  [place] of Knowledge:Fair  Language: Good  Akathisia:  No  Handed:  Right  AIMS (if indicated):     Assets:  Leisure Time Physical Health Resilience Social Support  ADL's:  Intact  Cognition: Impaired,  Mild  Sleep:      Treatment Meza Summary: Daily contact with patient to assess and evaluate symptoms and progress in treatment, Medication management and Meza schizoaffective disorder, bipolar type: -Crisis stabilization -Medication management:  Klonopin 0.5 mg BID for anxiety increased to 1 mg BID due to anxiety and agitation.  Zyprexa increased from 10 mg BID to 15 mg BID for increase in  agitation and aggression.  Saphris 10 mg once for agitation/anger -Individual counseling  Disposition: Recommend psychiatric Inpatient admission when medically cleared.  Waylan Boga, [place] 02/03/2015 11:08 AM Patient seen face-to-face for psychiatric evaluation, chart reviewed and case discussed with the physician extender and developed treatment Meza. Reviewed the information documented and agree with the treatment Meza. Corena Pilgrim, MD

## 2015-02-03 NOTE — ED Notes (Signed)
Pt. Noted in room. No complaints or concerns voiced. No distress or abnormal behavior noted. Will continue to monitor with security cameras. Q 15 minute rounds continue. 

## 2015-02-03 NOTE — ED Notes (Signed)
Patient is loud and belligerent.  Pacing halls and threatend this staff member.  "I'll take you out".  Provider informed and ordered medication administered.  Security and [employer] notified.

## 2015-02-03 NOTE — ED Notes (Signed)
Pt. Noted in hall "writing" on signature pad. No complaints or concerns voiced. No acute distress noted. Will continue to monitor with security cameras. Q 15 minute rounds continue.  

## 2015-02-03 NOTE — Progress Notes (Signed)
Disposition CSW completed patient referrals to the following inpatient psych facilities:  [facility]  CSW will continue to follow patient for placement needs. Seward SpeckLeo Grazia Taffe [facility] HospitalCSW,LCAS Behavioral Health Disposition CSW [phone]

## 2015-02-03 NOTE — ED Notes (Signed)
Pt. Standing at nurses desk "writing" on signature pad for 15 minutes.

## 2015-02-03 NOTE — ED Notes (Signed)
Pt. Noted in hall "writing" on signature pad. No complaints or concerns voiced. No acute distress noted. Will continue to monitor with security cameras. Q 15 minute rounds continue.

## 2015-02-03 NOTE — ED Notes (Signed)
[facility]. Pt. Sleeping, respirations regular and unlabored..  Will continue to monitor for safety via security cameras and Q 15 minute checks.

## 2015-02-03 NOTE — ED Notes (Signed)
Pt. Noted in room. No complaints or concerns voiced. No acute distress noted. Will continue to monitor with security cameras. Q 15 minute rounds continue. 

## 2015-02-04 MED ORDER — ASENAPINE MALEATE 5 MG SL SUBL
10.0000 mg | SUBLINGUAL_TABLET | Freq: Once | SUBLINGUAL | Status: AC
  Administered 2015-02-04: 10 mg via SUBLINGUAL
  Filled 2015-02-04: qty 2

## 2015-02-04 NOTE — ED Notes (Signed)
Pt. Noted in hall. No complaints or concerns voiced. No acute distress noted. Will continue to monitor with security cameras. Q 15 minute rounds continue. 

## 2015-02-04 NOTE — ED Notes (Signed)
Pt. Noted sleeping in room. No complaints or concerns voiced. No distress or abnormal behavior noted. Will continue to monitor with security cameras. Q 15 minute rounds continue. 

## 2015-02-04 NOTE — ED Notes (Signed)
Pt requested basin to soak dry scaly feet.

## 2015-02-04 NOTE — ED Notes (Signed)
Pt. In hall in front of nurses station responding to internal stimuli. Attempts to redirect pt. To his room met with little success.

## 2015-02-04 NOTE — ED Notes (Signed)
Pt is very agitated.  Yelling and hitting glass at nurses station.  He is demanding to see his personal belongings, pacing hallway, and making hand gestures.  NP aware and security is present.

## 2015-02-04 NOTE — BH Assessment (Signed)
[facility] Assessment Progress Note  The following facilities have been contacted to seek placement for this pt, with results as noted:  Beds available, information sent, decision pending:  [facility]   No beds available, but accepting referrals for future consideration; information sent:  [facility]   Declined:  [facility] (no high acuity beds available today)    [facility]:  [facility]    Darthula Desa, [place] Triage Specialist [phone]

## 2015-02-04 NOTE — ED Notes (Signed)
Pt awake, alert & responsive, no distress noted, rambling intermittently, cooperative, restless.  Monitoring for safety, Q 15 min checks in effect.

## 2015-02-04 NOTE — ED Notes (Signed)
Pt. Yelling in front of nurses station despite redirection attempts.

## 2015-02-04 NOTE — ED Notes (Signed)
Pt. Noted in room. No complaints or concerns voiced. No acute distress noted. Will continue to monitor with security cameras. Q 15 minute rounds continue. 

## 2015-02-04 NOTE — ED Notes (Signed)
Pt. Noted walking in hall. No complaints or concerns voiced. No acute distress noted. Will continue to monitor with security cameras. Q 15 minute rounds continue.  

## 2015-02-04 NOTE — ED Notes (Signed)
Pt. Put hands on MHT. Attempted to Redirect pt. to his room.

## 2015-02-04 NOTE — BHH Counselor (Signed)
Reassessment note:  Pt states that he is doing "the same as he was yesterday" and is ready to leave. He states that he "just got his check and he is excited about that". Pt was hard to understand and had rapid tangential speech. He states that he "needs to leave so he can pick his 30 day chip up at [facility]". He says that he is "a grown man and he doesn't want to go to a group home". Pt denies SI, HI, A/V. Pt became agitated when he learned he will not be leaving.   Kateri PlummerKristin Jeralynn Vaquera, M.S., LPCA, [place]ideLCASA, [facility] Licensed Professional Counselor Associate  Triage Specialist  [facility]  Therapeutic Triage Services Phone: [phone] Fax: [phone]

## 2015-02-04 NOTE — BHH Counselor (Signed)
Spoke to Mitchel [name] from [facility]. She states that she would like to start working on getting the pt a group home but needs some documentation to start this process. Social work to follow up with this request.   Mitchel HonourDiane Hayden DSS  [phone]  Kateri PlummerKristin Valine Drozdowski, M.S., [facility]CA,LCASA, [facility] Licensed Professional Counselor Associate  Triage Specialist  [facility]  Therapeutic Triage Services Phone: [phone] Fax: [phone]

## 2015-02-05 DIAGNOSIS — F250 Schizoaffective disorder, bipolar type: Secondary | ICD-10-CM | POA: Diagnosis not present

## 2015-02-05 DIAGNOSIS — Z9114 Patient's other noncompliance with medication regimen: Secondary | ICD-10-CM | POA: Diagnosis not present

## 2015-02-05 NOTE — ED Notes (Signed)
Pt wandering into other pts rooms, redirected back to room.
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
## 2015-02-05 NOTE — Consult Note (Signed)
Psychiatric Specialty Exam: Physical Exam  ROS  Blood pressure 146/65, pulse 76, temperature 97.4 F (36.3 C), temperature source Oral, resp. rate 20, SpO2 100 %.There is no weight on file to calculate BMI.  General Appearance: Disheveled  Eye SolicitorContact:: Fair  Speech: Pressured, hyperverbal  Volume: Increased at times  Mood: Anxious, irritable, angry at times, labile  Affect: Blunt  Thought Process: Irrelevant and Tangential  Orientation: Full (Time, Place, and Person)  Thought Content: Delusions, grandiose  Suicidal Thoughts: No  Homicidal Thoughts: No  Memory: Immediate; Fair Recent; Fair Remote; Fair  Judgement: Impaired  Insight: Lacking  Psychomotor Activity: Increased  Concentration: Poor  Recall: FiservFair  Fund of Knowledge:Fair  Language: Good  Akathisia: No  Handed: Right  AIMS (if indicated):    Assets: Leisure Time Physical Health Resilience Social Support  ADL's: Intact  Cognition: Impaired, Mild           Patient remains very manic this morning.  He is unable to participate and sustain a conversation.  Patient paces from his room to the hallway.  He occasionally talks to himself and states it is not a problem to have a conversation with himself.  Patient is compliant with his medications at this time.  He denies SI/HI/AVH but is responding to voices talking to him. Schizoaffective disorder, bipolar type (HCC)   Plan:  Continue seeking placement at an inpatient Psychiatric unit.  Dahlia ByesJosephine Cheyne Boulden  PMHNP-BC

## 2015-02-05 NOTE — ED Notes (Signed)
Pt awake, alert & responsive, no distress noted, intermittently pacing, cooperative at present.  Monitoring for safety, Q 15 min checks in effect.

## 2015-02-05 NOTE — ED Notes (Signed)
Pt is verbally aggressive and remains at nurses stations making hand gestures and fussing at staff.  He is uncooperative and demanding food and drinks all day.  15 minute checks and video monitoring continue.  NP notified of patients behavior.  She encouraged us to get EKG in which pt. Adamantly refused.

## 2015-02-06 DIAGNOSIS — Z9114 Patient's other noncompliance with medication regimen: Secondary | ICD-10-CM

## 2015-02-06 NOTE — ED Notes (Signed)
Patient appears calm, cooperative. Speech garbled, mostly incomprehensible. Denies SI, HI, AvH at present. C/o mild feet pain.  Encouragement offered. Given shower supplies. Given sandwich, cheese, and beverage.  Q 15 safety checks continue.

## 2015-02-06 NOTE — ED Notes (Addendum)
Pt refused vital signs. RN Notified

## 2015-02-06 NOTE — ED Notes (Signed)
NP and undersigned spoke with patient's guardian and she has given permission for [name] to receive Luvenia StarchInvega Sustena or Gregory MechanicInvega Trinza, which ever one will be available. NP has witnessed form and she says she will work on figuring out which medication will be ordered factoring on availability.

## 2015-02-06 NOTE — ED Notes (Signed)
Patient started pacing the unit. Attempted to go into 34 redirected by writer and [employer]. Patient becoming argumentative, irritable.

## 2015-02-06 NOTE — ED Notes (Signed)
Patient pleasant and polite on approach this am. Reports somebody had him put here because they felt he was "too excited". He reports not really feeling the need to be here and wants to get out before his birthday on Friday. Speech a little pressured but no agitation noted at present. Denies any SI/HI or a/v hallucinations.

## 2015-02-06 NOTE — BH Assessment (Addendum)
BHH Assessment Progress Note  At the request of Carolanne GrumblingGerald Taylor, MD, this writer called Nelly RoutArchana Kumar, MD, to ask about administering medications to this pt against his wishes.  She reports that, because the pt has a court appointed legal guardian, this can be done with the consent of the guardian.  This was reported back to Dr Ladona Ridgelaylor and to Dahlia ByesJosephine Onuoha, NP.  Josephine then requested that I call pt's outpatient provider, Damita DunningsKen Headen, MD, [facility] [phone] to ask about pt's injectable medication needs.  At 12:05 I spoke to Ladona Ridgelaylor, [facility].  She reports that Dr Omelia BlackwaterHeaden is not in the office at this time, but agrees to provide information.  She reports that pt has been on TanzaniaInvega Sustenna 234 mg once a month for the past several months.  He has had no averse reactions.  He was due for an injection on Friday, 02/01/2015, and they were considering changing him over to an Kirib[place]Invega Trinza injection once every three months, but there is no record of a recommended dosage.  When pt presented, however, Dr Omelia BlackwaterHeaden felt that, due to his mental status, pt needed to be placed under IVC, which he initiated as a prelude to [facility].  Ladona Ridgelaylor reports that pt's guardian was also on hand and encouraged this course of action.  These details were then staffed with Dr Ladona Ridgelaylor and with Julieanne CottonJosephine.  At 12:21 I called Mitchel HonourDiane Hayden [phone]), pt's [facility] guardian, to discuss verbal permission to administer medications to the pt.  She reports that her supervisor will need to grant permission.  I provided her with my name and phone number, and am awaiting her call back at this time.  When the call is received, I will forward it to Dr Ladona Ridgelaylor or Julieanne CottonJosephine to take the order.  Call back is pending as of this writing.  Doylene Canninghomas Meridian Scherger, MA Triage Specialist [phone]
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
## 2015-02-06 NOTE — ED Notes (Signed)
Patient continues to pace the hall. Irritable at times and argumentative about having a guardian. Taking Zyprexa but refusing klonopin saying that he doesn't take controlled medications. Telling the psychiatrist that he is not taking any injectable medications.

## 2015-02-06 NOTE — Progress Notes (Signed)
Entered in pt d/c instructions  [facility] Schedule an appointment as soon as possible for a visit This is your assigned Medicaid [place] access doctor If you prefer another contact DSS [phone] DSS assigned your doctor *You may receive a bill if you go to any family Dr not assigned to you [address] [place] [phone] Medicaid [place] Access Covered Patient USE THIS WEBSITE TO ASSIST WITH UNDERSTANDING YOUR COVERAGE, RENEW APPLICATION [place] Medicaid Transportation to Dr appts if you are have full Medicaid: [phone], [phone] or [phone] Transportation Supervisor [phone] As a Medicaid client you MUST contact DSS/SSI each time you change address, move to another [place] or another state to keep your address updated [place]: [phone] [address] [place], [place] [id number] [email or web address]
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
## 2015-02-07 NOTE — ED Notes (Signed)
Making threats to writer in regards to giving him snacks, trying to say staff is changing the clocks/time. Pacing the unit.

## 2015-02-07 NOTE — ED Notes (Signed)
Patient pacing hallway with gestures and mannerisms. Requested Zyprexa.   Prn Zyprexa given.

## 2015-02-07 NOTE — ED Notes (Signed)
Pt currently asleep; no s/s of distress noted.

## 2015-02-07 NOTE — Progress Notes (Addendum)
CSW consulted with 1st shift ED CSW who states she has spoke with patient's guardian, and that guardian wants FL2.   CSW's spoke with patient at bedside. Patient states that he has been living at an apartment complex/ [address] [place], [place]. Patient states that he has lived in a group home in the past. However, he states that he does not want to stay at a group home again.   Patient appeared to be anxious about his discharge date, and wanted to know when [facility] would [facility]. CSW informed him that she does not have that information.  CSW will complete FL2 due to guardians request. Also, guardian states that DSS is his Payee.     Gregory MageBrittney Kaydenn Meza, LCSWA [phone] ED CSW 02/07/2015 12:11 PM

## 2015-02-07 NOTE — ED Notes (Signed)
Pt came to the nurses station saying that he was having a visitor on the 23rd and that today is the 23rd. Attempted to explain that today is the 22nd of December. Pt walked away from the nurses station toward his room giving the writer his middle finger as a gesture.

## 2015-02-07 NOTE — ED Notes (Signed)
Directed patient away from room 34 where another patient is resting

## 2015-02-07 NOTE — ED Notes (Signed)
Patient refused vitals.

## 2015-02-07 NOTE — ED Notes (Signed)
Patient demanding to have more sandwiches and juices given to him, after pt had 2 sandwiches and 3 juices just a few minutes ago. Pt informed no more snacks, and he then became belligerent and threatening.

## 2015-02-07 NOTE — ED Notes (Signed)
Patient at nursing station, yelling at staff and demanding to go home immediately. Staff unable to redirect behaviors.

## 2015-02-07 NOTE — ED Notes (Signed)
Pt is labile in mood pacing the hall and restless this morning. Pt demanded two trays of food and is focused on leaving saying that his birthday is tomorrow. Pt is difficult to understand at times and is talking rapidly. Pt was given two trays for breakfast and his 0900 and 1000 medications due to his labile mood and earlier refusal of medications. Pt took the medications with his breakfast. Safety maintained on the unit.

## 2015-02-07 NOTE — Consult Note (Signed)
  Psychiatric Specialty Exam: Physical Exam  ROS  Blood pressure 142/75, pulse 100, temperature 98.2 F (36.8 C), temperature source Oral, resp. rate 15, SpO2 98 %.There is no weight on file to calculate BMI.  General Appearance: Casual and Fairly Groomed  Patent attorneyye Contact::  Good  Speech:  Clear and Coherent and Normal Rate  Volume:  Normal  Mood:  Anxious  Affect:  Congruent  Thought Process:  Coherent and Goal Directed  Orientation:  Full (Time, Place, and Person)  Thought Content:  WDL  Suicidal Thoughts:  No  Homicidal Thoughts:  No  Memory:  Immediate;   Fair Recent;   Fair Remote;   Fair  Judgement:  Fair  Insight:  Good  Psychomotor Activity:  Normal  Concentration:  Good  Recall:  Fair  Fund of Knowledge:  Fair  Language:  Good  Akathisia:  No  Handed:  Right  AIMS (if indicated):     Assets:  Desire for Improvement  ADL's:  Intact  Cognition:  WNL     Patient is making some improvement today.  He was able to participate in his evaluation today.  He asked and answered questions.  He is oriented x4.  He recognizes that his birthday is tomorrow and would like to go home tomorrow.  Patient is now compliant with his oral medications.   He is eating and drinking and tolerating same.  He still walks the hall way but is easily redirectable.  He denies SI/HI/AVH.  Schizoaffective disorder, bipolar type [facility](HCC)   Plan:  Pursue inpatient hospitalization.  Plan to give Injection Invega tomorrow when available  Dahlia ByesJosephine Altha Sweitzer   PMHNP-BC

## 2015-02-07 NOTE — [place] FL2 (Signed)
[place] MEDICAID FL2 LEVEL OF CARE SCREENING TOOL     IDENTIFICATION  Patient Name: Gregory Meza Birthdate: 1966/02/15 Sex: male Admission Date (Current Location): 02/01/2015  [facility] [place]:  [place]   Facility and Address:  [facility],  [address], [place] [id number]      Provider Number: [id number]  Attending Physician Name and Address:  Provider Default, MD  Relative Name and Phone Number:       Current Level of Care: Hospital Recommended Level of Care: Family Care Home, Other (Comment) (Guardian states she would like FL2 in case group home is needed.) Prior Approval Number:    Date Approved/Denied:   PASRR Number:    Discharge Plan:  (Inpatient treatment is reccomended at this time.)    Current Diagnoses: Patient Active Problem List   Diagnosis Date Noted  . Noncompliance with medication regimen   . Cocaine use disorder, moderate, dependence (HCC) 09/25/2014  . Cannabis use disorder, moderate, dependence (HCC) 09/25/2014  . Schizoaffective disorder, bipolar type (HCC)   . Substance abuse 03/29/2012    Class: Chronic  . Non-compliant patient 03/29/2012    Class: Chronic    Orientation RESPIRATION BLADDER Height & Weight    Self, Situation, Place, Time  Normal Continent   241 lbs.  BEHAVIORAL SYMPTOMS/MOOD NEUROLOGICAL BOWEL NUTRITION STATUS  Verbally abusive (Per note, patient has been argumentive and irritable.)  (None noted.) Continent  (Normal.)  AMBULATORY STATUS COMMUNICATION OF NEEDS Skin   Independent Verbally Normal                       Personal Care Assistance Level of Assistance   (Patient has been able to complete his ADL's.)           Functional Limitations Info   (None Noted.)          SPECIAL CARE FACTORS FREQUENCY   (None Noted)                    Contractures Contractures Info:  (None Noted.)    Additional Factors Info  Code Status (Patient is a full code.) Code Status  Info:  (Full Code.)             Current Medications (02/07/2015):  This is the current hospital active medication list Current Facility-Administered Medications  Medication Dose Route Frequency Provider Last Rate Last Dose  . acetaminophen (TYLENOL) tablet 650 mg  650 mg Oral Q4H PRN Mancel BaleElliott Wentz, MD      . alum & mag hydroxide-simeth (MAALOX/MYLANTA) 200-200-20 MG/5ML suspension 30 mL  30 mL Oral PRN Mancel BaleElliott Wentz, MD      . clonazePAM Scarlette Calico(KLONOPIN) tablet 1 mg  1 mg Oral BID Mojeed Akintayo   1 mg at 02/07/15 0813  . ibuprofen (ADVIL,MOTRIN) tablet 600 mg  600 mg Oral Q8H PRN Mancel BaleElliott Wentz, MD      . nicotine (NICODERM CQ - dosed in mg/24 hours) patch 21 mg  21 mg Transdermal Daily PRN Mancel BaleElliott Wentz, MD      . OLANZapine (ZYPREXA) tablet 15 mg  15 mg Oral BID PC Charm RingsJamison Y Lord, NP   15 mg at 02/07/15 [id number]  . OLANZapine zydis (ZYPREXA) disintegrating tablet 10 mg  10 mg Oral Q8H PRN Mojeed Akintayo   10 mg at 02/07/15 0148  . ondansetron (ZOFRAN) tablet 4 mg  4 mg Oral Q8H PRN Mancel BaleElliott Wentz, MD       Current Outpatient Prescriptions  Medication  Sig Dispense Refill  . fluPHENAZine (PROLIXIN) 10 MG tablet Take 10 mg by mouth 2 (two) times daily.    . Multiple Vitamin (MULTIVITAMIN WITH MINERALS) TABS tablet Take 1 tablet by mouth daily.    Marland Kitchen oxcarbazepine (TRILEPTAL) 600 MG tablet Take 600-1,200 mg by mouth 2 (two) times daily. 600 mg in morning and 1200 at bedtime    . paliperidone (INVEGA SUSTENNA) 234 MG/1.5ML SUSP injection Inject 234 mg into the muscle once.    . Pseudoephedrine HCl (SUDAFED PO) Take 1 tablet by mouth 2 (two) times daily as needed (allergies).    . traZODone (DESYREL) 100 MG tablet Take 1 tablet (100 mg total) by mouth at bedtime. 30 tablet 0  . trihexyphenidyl (ARTANE) 2 MG tablet Take 1 tablet (2 mg total) by mouth at bedtime. 30 tablet 0  . divalproex (DEPAKOTE) 500 MG DR tablet Take 2 tablets (1,000 mg total) by mouth at bedtime. (Patient not taking: Reported on  02/04/2015) 60 tablet 0  . OLANZapine zydis (ZYPREXA) 20 MG disintegrating tablet Take 1 tablet (20 mg total) by mouth at bedtime. (Patient not taking: Reported on 02/04/2015) 30 tablet 0     Discharge Medications: Please see discharge summary for a list of discharge medications.  Relevant Imaging Results:  Relevant Lab Results:   Additional Information  (Psychiatric NP states that the patient will get an Tanzania Injection tomorrow. )  Harlon Flor, Nielle Duford R, LCSW

## 2015-02-07 NOTE — Progress Notes (Addendum)
CSW spoke with Neysa Hotter of DSS/Patient's guardian. Guardian inquired if patient has his key in order to get into his apartment upon discharge if needed. CSW met with patient who states that his key is in his belongings.    CSW informed guardian that inpatient placement is the recommendation at this time. However, CSW made it clear that if the patient stabil[facility] in the [facility] he will be discharged back home . Guardian states that she is interested in group home placement for patient. CSW informed guardian that an [place] has been completed for patient. CSW also informed guardian to inform her if more documentation or information was needed in order to assist with patient. Guardian states that she does not need any more information or documentation at this time.  CSW spoke with guardian about being proactive and starting future group home placement. Guardian states that the patient does not have a good support system. She states that the patient only has professional/formal supports at this time. Guardian informed CSW that the patient may be evicted in January.  Diane Martie Round states that she is the supervisor of Barbarann Ehlers who is the patient's direct guardian. Diane is covering for [place] at this time.  Neysa Hotter /DSS Guardian [phone]   Willette Brace [phone] ED CSW 02/07/2015 1:01 PM

## 2015-02-07 NOTE — ED Notes (Signed)
Patient stood up on bed and marched in place. Laying down at present.

## 2015-02-07 NOTE — ED Notes (Signed)
Security directed patient to his room. Patient in his room pacing.  Q 15 safety checks continue.

## 2015-02-07 NOTE — Progress Notes (Signed)
CSW sent guardian FL2 document upon request in order to assist her with possible group home placement.   BrittneyTrish Mage Hollin Crewe, LCSWA [phone] ED CSW 02/07/2015 1:05 PM

## 2015-02-07 NOTE — Progress Notes (Addendum)
Pt noted walking in the hallways of [facility]  Pt in the last few days generally has been noted to do this pacing but with mumbling and incoherent statements even after CM greet him Today after CM greeted pt, he held a conversation with CM and inquired about his d/c date, time He asked if CM could "help get me home" Pt states it is his birthday "tommorow" CM checked and pt birthday is listed for [birth year]. Cm asked pt what he was informed this am by the [facility] NP and MD and he states that they talked about d/c. Cm informed pt that they may need to discuss this with his guardian Pt informed CM he was his own guardian "I got a paper from the judge saying so" "I lost my key to my apartment"  When Cm inquired about DSS guardian pt insisted again he was his own guardian Cm informed pt she would speak with [facility] team members. Cm spoke with TTS and NP who also noted pt holding conversations today.   Refer to ED SW notes to DSS guardian  Pt went back to his room after seeing Cm go to speak with NP and TTS staff

## 2015-02-07 NOTE — BH Assessment (Signed)
BHH Assessment Progress Note  The following facilities have been contacted to seek placement for this pt, with results as noted:  Beds available, information sent, decision pending:  [facility]   At capacity:  [facility]    Finnegan Gatta, [place] Triage Specialist [phone]
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
## 2015-02-07 NOTE — Progress Notes (Signed)
Cm noted in EPIC physicians sticky note that it is indicated by pt guardian that kenneth Headen is pt doctor   Cm entered this in d/c instructioDolores Framens Dolores FrameKenneth Headen, MD Schedule an appointment as soon as possible for a visit Although this is the MD your guardian states you see this is not the doctor listed on you medicaid card [facility] is listed as you p[facility] Please have DSS to assist with changing this on the medicaid card to prevent you from getting a bill from Dr headen [address] [place] [id number] [phone]

## 2015-02-07 NOTE — ED Notes (Signed)
Argumentative, defensive; demanding. Wants dayroom opened after reading the rules and reviewing current time with Clinical research associatewriter. Appears confused about the time/date. Asking for hand sanitizer.   Q 15 safety checks continue.

## 2015-02-07 NOTE — ED Provider Notes (Signed)
20:50- Nursing is concerned that pt. Is continuing to be agitated, asking and demanding things, confrontational, and not sleeping. I observed him pacing the hallway, talking to other patients, and responding appropriately to the nurses tal;king to him. He has prn medication ordered.   I see no reason to change the current treatment at this time.    Mancel BaleElliott Chadrick Sprinkle, MD 02/07/15 2056  Mancel BaleElliott Sankalp Ferrell, MD 02/07/15 [id number]
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
## 2015-02-08 MED ORDER — TUBERCULIN PPD 5 UNIT/0.1ML ID SOLN
5.0000 [IU] | Freq: Once | INTRADERMAL | Status: DC
  Administered 2015-02-08: 5 [IU] via INTRADERMAL
  Filled 2015-02-08: qty 0.1

## 2015-02-08 MED ORDER — CLONAZEPAM 1 MG PO TABS: 1.0000 mg | ORAL_TABLET | Freq: Two times a day (BID) | ORAL | Status: DC

## 2015-02-08 MED ORDER — PALIPERIDONE PALMITATE 819 MG/2.625ML IM SUSP
819.0000 mg | Freq: Once | INTRAMUSCULAR | Status: AC
  Administered 2015-02-08: 819 mg via INTRAMUSCULAR
  Filled 2015-02-08: qty 1

## 2015-02-08 MED ORDER — PALIPERIDONE PALMITATE 234 MG/1.5ML IM SUSP: 234.0000 mg | Freq: Once | INTRAMUSCULAR | Status: DC

## 2015-02-08 MED ORDER — OLANZAPINE 15 MG PO TABS: 15.0000 mg | ORAL_TABLET | Freq: Two times a day (BID) | ORAL | Status: DC

## 2015-02-08 MED ORDER — FLUPHENAZINE DECANOATE 25 MG/ML IJ SOLN: 25.0000 mg | Freq: Once | INTRAMUSCULAR | Status: DC

## 2015-02-08 MED FILL — Medication: Qty: 1 | Status: CN

## 2015-02-08 MED FILL — Medication: Qty: 1 | Status: AC

## 2015-02-08 NOTE — ED Notes (Signed)
Patient pacing in the hallways and remains agitated with redirection. Pt noted to have a long white strip of cloth that he took out of his pocket and wrapped around his head. RN convinced pt to give it to staff due to safety concerns. Pt handed it to staff without incident. Pt then states "I will give it to you if you just leave me the hell alone". Strip of cloth discarded. Pt continues to pace the unit.

## 2015-02-08 NOTE — Progress Notes (Signed)
Seen pt as he was being d/c from [facility] ED Pt very happy as today is his birthday Pt pleasantly voiced his thanks
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
## 2015-02-08 NOTE — BHH Counselor (Signed)
Contacted patients guardian at [place], [place] [phone]),  to inform her of the recommendation for discharge per Dr. Ladona Ridgelaylor and Nanine MeansJamison Lord, DNP. Patients guardian states that she does not feel that the discharge will be a safe discharge due to his previous behaviors.  She states that the patient is disruptive and has access to drugs. The guardian states that the patient has attempted to "transition" since July and continues to be hospitalized due to his behaviors. Patients guardian states that "the agency" is closed until 12/28 and asked if he could stay in the hospital until then. She states that she is currently on vacation as well and will communicate with her supervisor regarding this matter. She asked if the CSW had found the patient placement and stated that she thought that the patient was going to be admitted to an inpatient facility and she would have more time to locate placement. Informed her that the patient has stabilized at this point and has been psychiatrically cleared for discharge. She states that she will need to communicate this with her supervisor to work on a placement plan. Informed Dr. Ladona Ridgelaylor and Nanine MeansJamison Lord, DNP of guardian's concerns.  Asked CSW about placement.  Encouraged the patients guardian to follow up with the CSW at [phone] about placement options if more information is needed.      Davina PokeJoVea Olusegun Gerstenberger, LCSW Therapeutic Triage Specialist [facility] 02/08/2015 9:41 AM

## 2015-02-08 NOTE — BHH Suicide Risk Assessment (Signed)
Suicide Risk Assessment  Discharge Assessment   [facility]   Demographic Factors:  Male  Total Time spent with patient: 30 minutes   Musculoskeletal: Strength & Muscle Tone: within normal limits Gait & Station: normal Patient leans: N/A  Psychiatric Specialty Exam: Review of Systems  Constitutional: Negative.   HENT: Negative.   Eyes: Negative.   Respiratory: Negative.   Cardiovascular: Negative.   Gastrointestinal: Negative.   Genitourinary: Negative.   Musculoskeletal: Negative.   Skin: Negative.   Neurological: Negative.   Endo/Heme/Allergies: Negative.   Psychiatric/Behavioral: The patient is nervous/anxious.        Manic symptoms    Blood pressure 142/75, pulse 100, temperature 98.2 F (36.8 C), temperature source Oral, resp. rate 15, SpO2 98 %.There is no weight on file to calculate BMI.  General Appearance: Casual  Eye Contact::  Good  Speech:  Normal  Volume: WDL  Mood:  Euthymic, some anxiety  Affect:  Congruent  Thought Process:  Coherent   Orientation:  Full (Time, Place, and Person)  Thought Content:  WDL  Suicidal Thoughts:  No  Homicidal Thoughts:  No  Memory:  Immediate, good; Recent, good; remote, good  Judgement:  Fair  Insight:  Fair  Psychomotor Activity:  Normal  Concentration:  Good  Recall:  Good  Fund of Knowledge: Good  Language: Good  Akathisia:  No  Handed:  Right  AIMS (if indicated):     Assets:  Leisure Time Physical Health Resilience Social Support  ADL's:  Intact  Cognition:  WDL  Sleep:         Has this patient used any form of tobacco in the last 30 days? (Cigarettes, Smokeless Tobacco, Cigars, and/or Pipes) Yes, A prescription for an FDA-approved tobacco cessation medication was offered at discharge and the patient refused  Mental Status Per Nursing Assessment::   On Admission:   Mania  Current Mental Status by Physician: NA  Loss Factors: NA  Historical Factors: NA  Risk  Reduction Factors:   Sense of responsibility to family, Positive social support and Positive therapeutic relationship  Continued Clinical Symptoms:  Anxiety, mild  Cognitive Features That Contribute To Risk:  None    Suicide Risk:  Minimal: No identifiable suicidal ideation.  Patients presenting with no risk factors but with morbid ruminations; may be classified as minimal risk based on the severity of the depressive symptoms  Principal Problem: Schizoaffective disorder, bipolar type [facility](HCC) Discharge Diagnoses:  Patient Active Problem List   Diagnosis Date Noted  . Schizoaffective disorder, bipolar type (HCC) [F25.0]     Priority: High  . Noncompliance with medication regimen [Z91.14]   . Cocaine use disorder, moderate, dependence (HCC) [F14.20] 09/25/2014  . Cannabis use disorder, moderate, dependence (HCC) [F12.20] 09/25/2014  . Substance abuse [F19.10] 03/29/2012    Class: Chronic  . Non-compliant patient [Z91.19] 03/29/2012    Class: Chronic    Follow-up Information    Schedule an appointment as soon as possible for a visit with [facility].   Specialty:  Internal Medicine   Why:  This is your assigned Medicaid [place] access doctor If you prefer another contact DSS [phone] DSS assigned your doctor *You may receive a bill if you go to any family Dr not assigned to you   Contact information:   [address] [place]  [id number] [phone]       Follow up with Medicaid [place] Access Covered Patient .   Why:  USE THIS WEBSITE TO ASSIST  WITH UNDERSTANDING YOUR COVERAGE, RENEW APPLICATION [place] Medicaid Transportation to Dr appts if you are have full Medicaid: [phone], [phone] or [phone]    Contact information:   As a Medicaid client you MUST contact DSS/SSI each time you change address, move to another [place] or another state to keep your address updated [place]: [phone] [address] [place], [place] [address]  [email or web address]        Schedule an appointment as soon as possible for a visit with [facility], MD.   Why:  Although this is the MD your guardian states you see this is not the doctor listed on you medicaid card [facility] is listed as you pcp Please have DSS to assist with changing this on the medicaid card to prevent you from getting a bill from Dr Elvera Bicker information:   [address] [place] [address] [phone]       Plan Of Care/Follow-up recommendations:  Activity:  as tolerated Diet:  heart healthy diet  Is patient on multiple antipsychotic therapies at discharge:  No   Has Patient had three or more failed trials of antipsychotic monotherapy by history:  No  Recommended Plan for Multiple Antipsychotic Therapies: NA    Eleisha Branscomb, PMH-NP 02/08/2015, 9:46 AM

## 2015-02-08 NOTE — ED Notes (Signed)
PPD placed to [facility] 
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
## 2015-02-08 NOTE — BHH Counselor (Signed)
Gregory Meza contacted this Clinical research associatewriter to state that she is upset due to Gregory Meza was called and she is currently on vacation. She was informed that she is listed as the patients guardian and was called due to the discharge. She states that [name] is not the guardian and [name] the guardian and she should not have been called. This Clinical research associatewriter states that the guardian listed was called due to recommended for discharged. She states that she spoke with someone yesterday and was under the impression that placement in an inpatient facility was being sought. She asked if staff communicated and states that she spoke with Gregory Meza and Gregory Meza. This Clinical research associatewriter asked if she would like to speak with Tom for continuity of communication and transferred her to [name] in the TTS department.   [name], LCSW Therapeutic Triage Specialist [facility] 02/08/2015 10:13 AM

## 2015-02-08 NOTE — BH Assessment (Addendum)
BHH Assessment Progress Note  Per Carolanne GrumblingGerald Taylor, MD, this patient no longer requires psychiatric hospitalization.  He is currently under IVC, but the petition will expire at 14:00 today and there are no grounds to renew the petition.  Dr Ladona Ridgelaylor has rescinded the petition.  [facility].  Pt is under the guardianship of Diane Redmond BasemanHayden with [facility] [facility].  She has been in communication with this Clinical research associatewriter for an update on pt's status.  I asked her about consent for us to release information to [facility], and she reports that she does not have access to a fax machine at this time, but asks if she can give consent verbally.  At 10:35 I placed her on speaker phone, and with Nanine MeansJamison Lord, NP listening she gave verbal consent.  I will call [facility] to notify them.  Recommendation to follow up with them will be included in discharge instructions.  Pt's nurse, Minerva Areolaric, has been notified.  Doylene Canninghomas Anik Wesch, MA Triage Specialist [phone]

## 2015-02-08 NOTE — Discharge Instructions (Signed)
For your ongoing behavioral health needs, you are advised to follow [facility] with [facility]:       [facility]      [address]      [place], [place] [address]      [phone]

## 2015-02-14 ENCOUNTER — Emergency Department (HOSPITAL_COMMUNITY)
Admission: EM | Admit: 2015-02-14 | Discharge: 2015-02-28 | Disposition: A | Discharge status: Other Acute Inpatient Hospital | Payer: Medicaid Other | Attending: Emergency Medicine | Admitting: Emergency Medicine

## 2015-02-14 ENCOUNTER — Encounter (HOSPITAL_COMMUNITY): Payer: Self-pay | Admitting: Emergency Medicine

## 2015-02-14 DIAGNOSIS — F209 Schizophrenia, unspecified: Secondary | ICD-10-CM | POA: Insufficient documentation

## 2015-02-14 DIAGNOSIS — F1420 Cocaine dependence, uncomplicated: Secondary | ICD-10-CM | POA: Diagnosis present

## 2015-02-14 DIAGNOSIS — Z79899 Other long term (current) drug therapy: Secondary | ICD-10-CM | POA: Insufficient documentation

## 2015-02-14 DIAGNOSIS — F29 Unspecified psychosis not due to a substance or known physiological condition: Secondary | ICD-10-CM | POA: Diagnosis not present

## 2015-02-14 DIAGNOSIS — F17210 Nicotine dependence, cigarettes, uncomplicated: Secondary | ICD-10-CM | POA: Insufficient documentation

## 2015-02-14 DIAGNOSIS — F250 Schizoaffective disorder, bipolar type: Secondary | ICD-10-CM | POA: Diagnosis present

## 2015-02-14 DIAGNOSIS — Z046 Encounter for general psychiatric examination, requested by authority: Secondary | ICD-10-CM | POA: Diagnosis present

## 2015-02-14 DIAGNOSIS — F1410 Cocaine abuse, uncomplicated: Secondary | ICD-10-CM | POA: Insufficient documentation

## 2015-02-14 LAB — CBC WITH DIFFERENTIAL/PLATELET
Basophils Absolute: 0 10*3/uL (ref 0.0–0.1)
Basophils Relative: 0 %
Eosinophils Absolute: 0.4 10*3/uL (ref 0.0–0.7)
Eosinophils Relative: 4 %
HCT: 40.6 % (ref 39.0–52.0)
Hemoglobin: 13.9 g/dL (ref 13.0–17.0)
LYMPHS ABS: 2.4 10*3/uL (ref 0.7–4.0)
Lymphocytes Relative: 23 %
MCH: 30.5 pg (ref 26.0–34.0)
MCHC: 34.2 g/dL (ref 30.0–36.0)
MCV: 89.2 fL (ref 78.0–100.0)
MONOS PCT: 7 %
Monocytes Absolute: 0.8 10*3/uL (ref 0.1–1.0)
NEUTROS ABS: 7 10*3/uL (ref 1.7–7.7)
NEUTROS PCT: 66 %
Platelets: 332 10*3/uL (ref 150–400)
RBC: 4.55 MIL/uL (ref 4.22–5.81)
RDW: 14.1 % (ref 11.5–15.5)
WBC: 10.5 10*3/uL (ref 4.0–10.5)

## 2015-02-14 LAB — RAPID URINE DRUG SCREEN, HOSP PERFORMED
Amphetamines: NOT DETECTED
Barbiturates: NOT DETECTED
Benzodiazepines: NOT DETECTED
Cocaine: POSITIVE — AB
OPIATES: NOT DETECTED
Tetrahydrocannabinol: NOT DETECTED

## 2015-02-14 LAB — COMPREHENSIVE METABOLIC PANEL
ALBUMIN: 4.4 g/dL (ref 3.5–5.0)
ALT: 33 U/L (ref 17–63)
ANION GAP: 10 (ref 5–15)
AST: 30 U/L (ref 15–41)
Alkaline Phosphatase: 81 U/L (ref 38–126)
BILIRUBIN TOTAL: 0.8 mg/dL (ref 0.3–1.2)
BUN: 11 mg/dL (ref 6–20)
CO2: 29 mmol/L (ref 22–32)
Calcium: 9.4 mg/dL (ref 8.9–10.3)
Chloride: 100 mmol/L — ABNORMAL LOW (ref 101–111)
Creatinine, Ser: 0.74 mg/dL (ref 0.61–1.24)
GFR calc Af Amer: >60 mL/min (ref >60)
GLUCOSE: 96 mg/dL (ref 65–99)
POTASSIUM: 4 mmol/L (ref 3.5–5.1)
Sodium: 139 mmol/L (ref 135–145)
TOTAL PROTEIN: 7.8 g/dL (ref 6.5–8.1)

## 2015-02-14 LAB — SALICYLATE LEVEL: Salicylate Lvl: <4 mg/dL (ref 2.8–30.0)

## 2015-02-14 LAB — ACETAMINOPHEN LEVEL

## 2015-02-14 LAB — ETHANOL

## 2015-02-14 MED ORDER — ACETAMINOPHEN 325 MG PO TABS
650.0000 mg | ORAL_TABLET | ORAL | Status: DC | PRN
  Administered 2015-02-22: 650 mg via ORAL
  Filled 2015-02-14: qty 2

## 2015-02-14 MED ORDER — LORAZEPAM 1 MG PO TABS
1.0000 mg | ORAL_TABLET | Freq: Three times a day (TID) | ORAL | Status: DC | PRN
  Administered 2015-02-15 – 2015-02-17 (×4): 1 mg via ORAL
  Filled 2015-02-14 (×4): qty 1

## 2015-02-14 MED ORDER — CLONAZEPAM 1 MG PO TABS
1.0000 mg | ORAL_TABLET | Freq: Two times a day (BID) | ORAL | Status: DC
  Administered 2015-02-15 – 2015-02-20 (×11): 1 mg via ORAL
  Filled 2015-02-14 (×11): qty 1

## 2015-02-14 MED ORDER — ADULT MULTIVITAMIN W/MINERALS CH
1.0000 | ORAL_TABLET | Freq: Every day | ORAL | Status: DC
  Administered 2015-02-15 – 2015-02-28 (×14): 1 via ORAL
  Filled 2015-02-14 (×14): qty 1

## 2015-02-14 MED ORDER — IBUPROFEN 200 MG PO TABS
600.0000 mg | ORAL_TABLET | Freq: Three times a day (TID) | ORAL | Status: DC | PRN
  Filled 2015-02-14: qty 3

## 2015-02-14 MED ORDER — OLANZAPINE 5 MG PO TABS
15.0000 mg | ORAL_TABLET | Freq: Two times a day (BID) | ORAL | Status: DC
  Administered 2015-02-15: 15 mg via ORAL
  Filled 2015-02-14: qty 1

## 2015-02-14 MED ORDER — ONDANSETRON HCL 4 MG PO TABS: 4.0000 mg | ORAL_TABLET | Freq: Three times a day (TID) | ORAL | Status: DC | PRN

## 2015-02-14 MED ORDER — ZIPRASIDONE MESYLATE 20 MG IM SOLR
10.0000 mg | Freq: Once | INTRAMUSCULAR | Status: AC | PRN
  Administered 2015-02-16: 10 mg via INTRAMUSCULAR
  Filled 2015-02-14: qty 20

## 2015-02-14 NOTE — ED Provider Notes (Signed)
CSN: [id number]     Arrival date & time 02/14/15  1756 History   First MD Initiated Contact with Patient 02/14/15 1829     Chief Complaint  Patient presents with  . Medical Clearance     (Consider location/radiation/quality/duration/timing/severity/associated sxs/prior Treatment) HPI 49 year old male with history of schizophrenia who presents with psychosis. IVC paper work Museum/gallery curator him and [employer] sent out to see patient.  Paperwork states he has been hearing "God and the Devil", having increasing agitation, threw jar at police, was danger to self and others. Denies HI, SI, VH of AH. Denies any complaints, including recent illness, fever, chills, N/V/D, abd pain, headache, chest pain, sob, cough.   Past Medical History  Diagnosis Date  . Schizophrenia (HCC)    History reviewed. No pertinent past surgical history. History reviewed. No pertinent family history. Social History  Substance Use Topics  . Smoking status: Current Every Day Smoker -- 1.50 packs/day for 3 years    Types: Cigarettes  . Smokeless tobacco: None  . Alcohol Use: 1.2 oz/week    1 Glasses of wine, 1 Cans of beer per week    Review of Systems  Unable to perform ROS: Psychiatric disorder      Allergies  Haldol and Risperidone and related  Home Medications   Prior to Admission medications   Medication Sig Start Date End Date Taking? Authorizing Provider  clonazePAM (KLONOPIN) 1 MG tablet Take 1 tablet (1 mg total) by mouth 2 (two) times daily. 02/08/15   Charm Rings, NP  Multiple Vitamin (MULTIVITAMIN WITH MINERALS) TABS tablet Take 1 tablet by mouth daily. 09/25/14   Thermon Leyland, NP  OLANZapine (ZYPREXA) 15 MG tablet Take 1 tablet (15 mg total) by mouth 2 (two) times daily after a meal. 02/08/15   Charm Rings, NP  paliperidone (INVEGA SUSTENNA) 234 MG/1.5ML SUSP injection Inject 234 mg into the muscle once.    Historical Provider, MD   BP 158/77 mmHg  Pulse 104  Temp(Src) 98.4 F (36.9 C)  (Oral)  Resp 18  SpO2 97% Physical Exam Physical Exam  Nursing note and vitals reviewed. Constitutional: Well developed, well nourished, non-toxic, and in no acute distress Head: Normocephalic and atraumatic.  Mouth/Throat: Oropharynx is clear and moist.  Neck: Normal range of motion. Neck supple.  Cardiovascular: Normal rate and regular rhythm.   Pulmonary/Chest: Effort normal and breath sounds normal.  Abdominal: Soft. There is no tenderness. There is no rebound and no guarding.  Musculoskeletal: Normal range of motion.  Neurological: Alert, no facial droop, moves all extremities symmetrically Skin: Skin is warm and dry.  Psychiatric: Pressured speech difficult to discern, occasionally speaking gibberish, occasionally trying to use hand gestures to communicate, makes cross across his chest multiple times during evaluation  ED Course  Procedures (including critical care time) Labs Review Labs Reviewed  COMPREHENSIVE METABOLIC PANEL - Abnormal; Notable for the following:    Chloride 100 (*)    All other components within normal limits  ACETAMINOPHEN LEVEL - Abnormal; Notable for the following:    Acetaminophen (Tylenol), Serum <10 (*)    All other components within normal limits  URINE RAPID DRUG SCREEN, HOSP PERFORMED - Abnormal; Notable for the following:    Cocaine POSITIVE (*)    All other components within normal limits  ETHANOL  SALICYLATE LEVEL  CBC WITH DIFFERENTIAL/PLATELET    Imaging Review No results found. I have personally reviewed and evaluated these images and lab results as part of my  medical decision-making.   EKG Interpretation None      MDM   Final diagnoses:  Psychosis, unspecified psychosis type  Schizophrenia, unspecified type (HCC)    49 year old male presenting for medical clearance. History of schizophrenia.  on arrival, he is hemodynamically stable. without suicidal or homicidal ideations. Evidence of acute psychosis, with pressured  speech, often speaking to reportedly God. During medical evaluation he makes the cross across his chest multiple times. Denies any visual or auditory hallucinations, but his IVC paperwork states that he has been reporting that God and the Devil have been telling him what to do and that he will spirits were telling him to hit others and harm others. Basic blood work is unremarkable. Tox workup only positive for cocaine. Is felt to be medically cleared and TTS his conslulted. Recommending inpatient admission for treatment. Placement is pending at this time.   Gregory Guiseana Duo Yaslene Lindamood, MD 02/14/15 2228

## 2015-02-14 NOTE — ED Notes (Signed)
Patient and belongings have been wanded by security. One patient belongings bag. Patient has silver ring on left ring finger that he and [employer] are unable to remove. Psych ED nurse made aware of same.

## 2015-02-14 NOTE — BH Assessment (Signed)
Assessment completed. Consulted Hulan FessIjeoma Nwaeze, NP who recommended long term treatment. TTS to seek placement.

## 2015-02-14 NOTE — ED Notes (Signed)
Patient laying in bed states "I have nothing to say to you get out of my room". Encouragement and support provided and safety maintain. Q 15 min safety checks in place.

## 2015-02-14 NOTE — Progress Notes (Signed)
CSW spoke with patient's guardian/ Diane who states that believe the patient has mental health and substance abuse issues. Guardian states that she is interested in facility placement for patient in the future.  CSW informed guardian that she would keep her updated about discharge plans.   Trish MageBrittney Dyami Umbach, LCSWA [phone] ED CSW 02/14/2015 10:42 PM

## 2015-02-14 NOTE — ED Notes (Signed)
Patient presents with [employer] under IVC. Paperwork states history of schizo effective disorder, bipolar, substance abuse, hearing "God and the Devil". Increasing agitation, threw jar at police, danger to self and others.  During triage, patient denies SI/HI, AVH. Patient at times speaking in unknown language, has hat over head, won't make eye contact.

## 2015-02-14 NOTE — BH Assessment (Signed)
Tele Assessment Note   Gregory Meza is an 49 y.o. male presenting to [facility] after being petitioned by the Tree surgeon at [employer] for threatening others. Pt stated "I took all my drugs and my nose started to bleed". The petition reports that the pt is diagnosed with schizo-effective disorder and bipolar. It also states that pt has been reporting that God and the devil were telling him what to do and stated that he is Muslim, [place] and [place]. It also reports that evil spirits were telling him to hit others and he threatened the owner of [employer] and swung at another client. He was also aggressive towards the police by throwing a jar at them and threaten his guardian at DSS.  Inpatient treatment is recommended.     Diagnosis: Schizophrenia   Past Medical History:  Past Medical History  Diagnosis Date  . Schizophrenia (HCC)     History reviewed. No pertinent past surgical history.  Family History: No family history on file.  Social History:  reports that he has been smoking Cigarettes.  He has a 4.5 pack-year smoking history. He does not have any smokeless tobacco history on file. He reports that he drinks about 1.2 oz of alcohol per week. He reports that he uses illicit drugs ("Crack" cocaine and Cocaine) about 5 times per week.  Additional Social History:  Alcohol / Drug Use History of alcohol / drug use?:  (Unable to assess )  CIWA: CIWA-Ar BP: 158/77 mmHg Pulse Rate: 104 COWS:    PATIENT STRENGTHS: (choose at least two) Average or above average intelligence Supportive family/friends  Allergies:  Allergies  Allergen Reactions  . Haldol [Haloperidol]     Pt reports he is allergic to injectable and PO haldol  . Risperidone And Related     Pt reports he is allergic to injectable and PO     Home Medications:  (Not in a hospital admission)  OB/GYN Status:  No LMP for male patient.  General Assessment Data Location of Assessment: [facility] TTS  Assessment: In system Is this a Tele or Face-to-Face Assessment?: Face-to-Face Is this an Initial Assessment or a Re-assessment for this encounter?: Initial Assessment Marital status:  (UTA) Living Arrangements:  (Unable to assess ) Can pt return to current living arrangement?:  (Unable to assess ) Admission Status: Involuntary Is patient capable of signing voluntary admission?: No Referral Source: Other Systems developer ) Insurance type: Medicaid      Crisis Care Plan Living Arrangements:  (Unable to assess ) Legal Guardian:  (Unable to assess ) Name of Psychiatrist: Damita Dunnings, MD @ [facility] Name of Therapist: [facility]  Education Status Is patient currently in school?:  (Unable to assess) Current Grade:  (Unable to assess ) Highest grade of school patient has completed:  (Unable to assess ) Name of school:  (Unable to assess ) Contact person:  (Unable to assess )  Risk to self with the past 6 months Suicidal Ideation:  (unable to assess ) Has patient been a risk to self within the past 6 months prior to admission? :  (UTA) Suicidal Intent:  (Unable to assess ) Has patient had any suicidal intent within the past 6 months prior to admission? :  (UTA) Is patient at risk for suicide?:  (Unable to assess ) Suicidal Plan?:  (Unable to assess ) Has patient had any suicidal plan within the past 6 months prior to admission? :  (UTA) Access to Means:  (  Unable to assess ) What has been your use of drugs/alcohol within the last 12 months?: UDS is positive for cocaine and alcohol  Previous Attempts/Gestures:  (Unable to assess ) How many times?:  (Unable to assess ) Other Self Harm Risks:  (Unable to assess ) Triggers for Past Attempts:  (UTA) Intentional Self Injurious Behavior:  (Unable to assess ) Family Suicide History:  (UTA) Recent stressful life event(s):  (Unable to assess ) Persecutory voices/beliefs?:  (Unable to assess ) Depression:   (Unable to assess ) Depression Symptoms:  (Unable to assess ) Substance abuse history and/or treatment for substance abuse?: Yes Suicide prevention information given to non-admitted patients: Not applicable  Risk to Others within the past 6 months Homicidal Ideation:  (Unable to assess ) Does patient have any lifetime risk of violence toward others beyond the six months prior to admission? :  (UTA) Thoughts of Harm to Others:  (Unable to assess ) Current Homicidal Intent:  (Unable to assess ) Current Homicidal Plan:  (Unable to assess ) Access to Homicidal Means:  (Unable to assess ) Identified Victim:  (Unable to assess ) History of harm to others?:  (Unable to assess ) Assessment of Violence: None Noted Violent Behavior Description: No violent behaviors observed. Pt is calm at this time.  Does patient have access to weapons?:  (Unable to assess) Criminal Charges Pending?:  (Unable to assess ) Does patient have a court date:  (Unable to assess ) Is patient on probation?: Unknown  Psychosis Hallucinations:  (Unable to assess) Delusions:  (Unable to assess )  Mental Status Report Appearance/Hygiene: In scrubs Eye Contact: Poor Motor Activity: Agitation Speech: Rapid, Pressured, Word salad Level of Consciousness: Alert Mood: Preoccupied Affect: Preoccupied Anxiety Level: Minimal Thought Processes: Flight of Ideas Judgement: Impaired Orientation: Not oriented Obsessive Compulsive Thoughts/Behaviors: Unable to Assess  Cognitive Functioning Concentration: Unable to Assess Memory: Unable to Assess IQ: Average Insight: Unable to Assess Impulse Control: Unable to Assess Appetite:  (Unable to assess) Weight Loss:  (Unable to assess ) Weight Gain:  (Unable to assess) Sleep: Unable to Assess Vegetative Symptoms: Unable to Assess  ADLScreening [facility] Assessment Services) Patient's cognitive ability adequate to safely complete daily activities?: Yes Patient able to express need  for assistance with ADLs?: Yes Independently performs ADLs?: Yes (appropriate for developmental age)  Prior Inpatient Therapy Prior Inpatient Therapy: Yes Prior Therapy Dates: 2014,2015, 2016 [facility]  Reason for Treatment: Schizophrenia and cocaine   Prior Outpatient Therapy Prior Outpatient Therapy: Yes Prior Therapy Dates: Current [facility]  Does patient have an ACCT team?: Unknown Does patient have Intensive In-House Services?  : No Does patient have [facility] services? : Unknown Does patient have P4CC services?: Unknown  ADL Screening (condition at time of admission) Patient's cognitive ability adequate to safely complete daily activities?: Yes Is the patient deaf or have difficulty hearing?: No Does the patient have difficulty seeing, even when wearing glasses/contacts?: No Does the patient have difficulty concentrating, remembering, or making decisions?: No Patient able to express need for assistance with ADLs?: Yes Does the patient have difficulty dressing or bathing?: No Independently performs ADLs?: Yes (appropriate for developmental age)       Abuse/Neglect Assessment (Assessment to be complete while patient is alone) Physical Abuse:  (Unable to assess ) Verbal Abuse:  (Unable to assess ) Sexual Abuse:  (Unable to assess ) Exploitation of patient/patient's resources:  (Unable to assess ) Self-Neglect:  (Unable to assess)  Advance Directives (For Healthcare) Does patient have an advance directive?: No Would patient like information on creating an advanced directive?: No - patient declined information    Additional Information 1:1 In Past 12 Months?: No CIRT Risk: Yes Elopement Risk: No Does patient have medical clearance?: Yes     Disposition:  Disposition Initial Assessment Completed for this Encounter: Yes Disposition of Patient: Inpatient treatment program Type of inpatient  treatment program: Adult  Blinda Turek S 02/14/2015 8:17 PM

## 2015-02-15 MED ORDER — PALIPERIDONE ER 6 MG PO TB24
9.0000 mg | ORAL_TABLET | Freq: Every day | ORAL | Status: DC
  Administered 2015-02-15 – 2015-02-28 (×14): 9 mg via ORAL
  Filled 2015-02-15 (×15): qty 1

## 2015-02-15 MED ORDER — OXCARBAZEPINE 300 MG PO TABS
600.0000 mg | ORAL_TABLET | Freq: Two times a day (BID) | ORAL | Status: DC
  Administered 2015-02-15 – 2015-02-20 (×11): 600 mg via ORAL
  Filled 2015-02-15 (×12): qty 2

## 2015-02-15 MED ORDER — AMANTADINE HCL 100 MG PO CAPS
100.0000 mg | ORAL_CAPSULE | Freq: Two times a day (BID) | ORAL | Status: DC
  Administered 2015-02-15 – 2015-02-28 (×27): 100 mg via ORAL
  Filled 2015-02-15 (×28): qty 1

## 2015-02-15 MED ORDER — TUBERCULIN PPD 5 UNIT/0.1ML ID SOLN
5.0000 [IU] | Freq: Once | INTRADERMAL | Status: AC
  Administered 2015-02-15: 5 [IU] via INTRADERMAL
  Filled 2015-02-15: qty 0.1

## 2015-02-15 NOTE — ED Notes (Signed)
Pt has been pacing in the hall talking to himself and making gestures at the nurses station. Pt is labile with his mood and demanding wanting two trays brought to him for each meal. Redirected pt as needed. Safety maintained on the unit.

## 2015-02-15 NOTE — Progress Notes (Addendum)
CSW received telephone call from [employer] Meza, [place], Adult [facility] Guardian, contact# [phone]. Guardian stated patient has been evicted from his apartment and can no longer return. Guardian stated the landlord stated he cannot deal with patinet due to his behaviors. Guardian stated she has been searching for group home placement [place] over [place] but that she just needs some assistance. CSW reiterated to guardian that patient could not be held in the ED waiting for placement.   CSW informed Guardian no information was available to report regarding patient's status at this time. Guardian provided a contact number for [facility] (over the weekend) [phone] if someone would need to contact their agency. CSW will maintain contact with Guardian regarding status.  Elenore PaddyLaVonia Nichole Keltner, LCSWA [phone] ED CSW 02/15/2015 9:38 AM

## 2015-02-15 NOTE — Progress Notes (Signed)
CSW faxed the consult information from NP to [name], Director, [facility], [place], [place] in regards to group home placement. CSW called Director to inform her of the fax coming to her agency.  Elenore PaddyLaVonia Mackenzye Mackel, LCSWA [phone] ED CSW 02/15/2015 4:11 PM

## 2015-02-15 NOTE — Progress Notes (Signed)
CSW received telephone call from [name], Director, [facility], [place], [place]. Director stated she was informed to call CSW by Guardian at [facility], [employer] [place].   Director stated she would need an FL2 and his reason for admission. Director stated her contact number is [phone] and the fax# is [phone].  Elenore PaddyLaVonia Cheney Ewart, LCSWA [phone] ED CSW 02/15/2015 10:41 AM

## 2015-02-15 NOTE — ED Notes (Signed)
Pt is up pacing in the hall this morning with pressured speech. Pt talks from one subject to another. He speaks in a loud demanding voice and is difficult to understand at times. Offered support and 15 minute checks. Safety maintained in the SAPPU.

## 2015-02-15 NOTE — Progress Notes (Signed)
CSW called to speak with Gregory Meza, [facility]. Gregory [name], Supervisor answered the phone and went to see if she could locate Gregory Meza. Gregory Meza answered stating she and Gregory Meza were both listening.  CSW informed guardian that CSW [facility]ed the [facility] and the Consult information from the NP to Gregory Meza, Director, [facility] regarding placement.   Gregory Meza stated she had not received the discharge summary through fax. CSW informed Gregory Meza that the fax was sent and CSW has the verification page stating OK, meaning the fax went through to their agency.   Gregory Meza, LCSWA [phone] ED CSW 02/15/2015 4:21 PM

## 2015-02-15 NOTE — Progress Notes (Signed)
Entered in D/c instructions Pa, [facility] Schedule an appointment as soon as possible for a visit This is your assigned Medicaid [place] access doctor If you prefer another contact DSS [phone] DSS assigned your doctor *You may receive a bill if you go to any family Dr not assigned to you [address] [place] [phone] Medicaid [place] Access Covered Patient USE THIS WEBSITE TO ASSIST WITH UNDERSTANDING YOUR COVERAGE, RENEW APPLICATION [place] Medicaid Transportation to Dr appts if you are have full Medicaid: [phone], [phone] or [phone] Transportation Supervisor [phone] As a Medicaid client you MUST contact DSS/SSI each time you change address, move to another [place] county or another state to keep your address updated [place]: [phone] [address] [place], [place] [phone] [email or web address]

## 2015-02-15 NOTE — ED Notes (Signed)
Patient refused vitals.  Nurse notified.

## 2015-02-15 NOTE — BH Assessment (Addendum)
[facility] Assessment Progress Note  The following facilities have been contacted to seek placement for this pt, with results as noted:  Beds available, information sent, decision pending:  [facility]   Declined:  [place] (pt acuity)    Doylene Canninghomas Ranell Finelli, MA Triage Specialist [phone]

## 2015-02-15 NOTE — [place] FL2 (Signed)
[place] MEDICAID FL2 LEVEL OF CARE SCREENING TOOL     IDENTIFICATION  Patient Name: MERYL PONDER Birthdate: Jan 03, 1966 Sex: male Admission Date (Current Location): 02/14/2015  [facility] and [place] Number:  Producer, television/film/video and Address:  [facility],  [address], [place] [address]      Provider Number: [id number]  Attending Physician Name and Address:  Lavera Guise, MD  Relative Name and Phone Number:       Current Level of Care: Hospital Recommended Level of Care: Other (Comment) (Group Home Placement) Prior Approval Number:    Date Approved/Denied:   PASRR Number:    Discharge Plan: Other (Comment)    Current Diagnoses: Patient Active Problem List   Diagnosis Date Noted  . Noncompliance with medication regimen   . Cocaine use disorder, moderate, dependence (HCC) 09/25/2014  . Cannabis use disorder, moderate, dependence (HCC) 09/25/2014  . Schizoaffective disorder, bipolar type (HCC)   . Substance abuse 03/29/2012    Class: Chronic  . Non-compliant patient 03/29/2012    Class: Chronic    Orientation RESPIRATION BLADDER Height & Weight     (Not oriented)    Continent   241 lbs.  BEHAVIORAL SYMPTOMS/MOOD NEUROLOGICAL BOWEL NUTRITION STATUS   (Pressured speech, disorganized thought process, usually able to calm down once becomes agitated)   Continent Diet (Diet regular)  AMBULATORY STATUS COMMUNICATION OF NEEDS Skin   Independent                           Personal Care Assistance Level of Assistance              Functional Limitations Info  Sight Sight Info: Adequate        SPECIAL CARE FACTORS FREQUENCY                       Contractures      Additional Factors Info  Code Status, Allergies (Full Code)   Allergies Info:  (Haldol, Risperidone, )           Current Medications (02/15/2015):  This is the current hospital active medication list Current Facility-Administered Medications  Medication Dose  Route Frequency Provider Last Rate Last Dose  . acetaminophen (TYLENOL) tablet 650 mg  650 mg Oral Q4H PRN Lavera Guise, MD      . amantadine (SYMMETREL) capsule 100 mg  100 mg Oral BID Mojeed Akintayo   100 mg at 02/15/15 1309  . clonazePAM (KLONOPIN) tablet 1 mg  1 mg Oral BID Lavera Guise, MD   1 mg at 02/15/15 8119  . ibuprofen (ADVIL,MOTRIN) tablet 600 mg  600 mg Oral Q8H PRN Lavera Guise, MD      . LORazepam (ATIVAN) tablet 1 mg  1 mg Oral Q8H PRN Lavera Guise, MD      . multivitamin with minerals tablet 1 tablet  1 tablet Oral Daily Lavera Guise, MD   1 tablet at 02/15/15 [phone]  . ondansetron (ZOFRAN) tablet 4 mg  4 mg Oral Q8H PRN Lavera Guise, MD      . Oxcarbazepine (TRILEPTAL) tablet 600 mg  600 mg Oral BID Mojeed Akintayo   600 mg at 02/15/15 1309  . paliperidone (INVEGA) 24 hr tablet 9 mg  9 mg Oral Daily Mojeed Akintayo   9 mg at 02/15/15 1309  . tuberculin injection 5 Units  5 Units Intradermal Once Charm Rings, NP  5 Units at 02/15/15 1326  . ziprasidone (GEODON) injection 10 mg  10 mg Intramuscular Once PRN Lavera Guiseana Duo Liu, MD       Current Outpatient Prescriptions  Medication Sig Dispense Refill  . clonazePAM (KLONOPIN) 1 MG tablet Take 1 tablet (1 mg total) by mouth 2 (two) times daily. 30 tablet 0  . Multiple Vitamin (MULTIVITAMIN WITH MINERALS) TABS tablet Take 1 tablet by mouth daily.    Marland Kitchen. OLANZapine (ZYPREXA) 15 MG tablet Take 1 tablet (15 mg total) by mouth 2 (two) times daily after a meal. 60 tablet 0  . paliperidone (INVEGA SUSTENNA) 234 MG/1.5ML SUSP injection Inject 234 mg into the muscle once.       Discharge Medications: Please see discharge summary for a list of discharge medications.  Relevant Imaging Results:  Relevant Lab Results:   Additional Information    Adrian BlackwaterSarah M Gonzalez-Graham, LCSW

## 2015-02-15 NOTE — BH Assessment (Addendum)
[facility] Assessment Progress Note  Per Thedore MinsMojeed Akintayo, MD, this pt requires psychiatric hospitalization at this time and [facility] should be pursued. At 15:54 Irving Burtonmily at [facility] authorized referral, authorization [id number]. Please note that authorization does not mean that pt has been accepted to the facility. At 15:58 I spoke to Robinette at [facility], who took demographic information by telephone. Referral is being faxed as of this writing.Doylene Canning.  Catherine Oak, MA Triage Specialist [phone]   Addendum:  At 16:30 Kim at [facility] confirms receipt of referral.  Decision is pending.  Doylene Canninghomas Farrah Skoda, MA Triage Specialist [phone]

## 2015-02-15 NOTE — ED Notes (Signed)
TB test placed in rt lower arm.

## 2015-02-15 NOTE — Consult Note (Signed)
[place] Psychiat[facility] Consult   Reason for Consult:  Cocaine abuse and mania Referring Physician:  EDP Patient Identification: Gregory Meza MRN:  [id number] Principal Diagnosis: Schizoaffective disorder, bipolar type [facility]) Diagnosis:   Patient Active Problem List   Diagnosis Date Noted  . Cocaine use disorder, moderate, dependence ([place]) [F14.20] 09/25/2014    Priority: High  . Schizoaffective disorder, bipolar type ([place]) [F25.0]     Priority: High  . Noncompliance with medication regimen [Z91.14]   . Cannabis use disorder, moderate, dependence ([place]) [F12.20] 09/25/2014  . Substance abuse [F19.10] 03/29/2012    Class: Chronic  . Non-compliant patient [Z91.19] 03/29/2012    Class: Chronic    Total Time spent with patient: 45 minutes  Subjective:   Gregory Meza is a 49 y.o. male patient admitted with mania.  HPI:  On admission: 49 y.o. male presenting to [facility] after being petitioned by the Investment banker, operational at [employer] for threatening others. Pt stated "I took all my drugs and my nose started to bleed". The petition reports that the pt is diagnosed with schizo-effective disorder and bipolar. It also states that pt has been reporting that God and the devil were telling him what to do and stated that he is Muslim, [place]. It also reports that evil spirits were telling him to hit others and he threatened the owner of [place] and swung at another client. He was also aggressive towards the police by throwing a jar at them and threaten his guardian at [place].   Today:  Patient remains manic with pressured speech, flight of ideas, and increase in activity.    Past Psychiatric History: Schizoaffective disorder, cocaine abuse  Risk to Self: Suicidal Ideation:  (unable to assess ) Suicidal Intent:  (Unable to assess ) Is patient at risk for suicide?:  (Unable to assess ) Suicidal Plan?:  (Unable to assess ) Access to Means:  (Unable to assess ) What has been  your use of drugs/alcohol within the last 12 months?: UDS is positive for cocaine and alcohol  How many times?:  (Unable to assess ) Other Self Harm Risks:  (Unable to assess ) Triggers for Past Attempts:  (UTA) Intentional Self Injurious Behavior:  (Unable to assess ) Risk to Others: Homicidal Ideation:  (Unable to assess ) Thoughts of Harm to Others:  (Unable to assess ) Current Homicidal Intent:  (Unable to assess ) Current Homicidal Plan:  (Unable to assess ) Access to Homicidal Means:  (Unable to assess ) Identified Victim:  (Unable to assess ) History of harm to others?:  (Unable to assess ) Assessment of Violence: None Noted Violent Behavior Description: No violent behaviors observed. Pt is calm at this time.  Does patient have access to weapons?:  (Unable to assess) Criminal Charges Pending?:  (Unable to assess ) Does patient have a court date:  (Unable to assess ) Prior Inpatient Therapy: Prior Inpatient Therapy: Yes Prior Therapy Dates: 2014,2015, 2016 [facility]ilty[facility]  Reason for Treatment: Schizophrenia and cocaine  Prior Outpatient Therapy: Prior Outpatient Therapy: Yes Prior Therapy Dates: Current Prior Therapy Facilty/Provider(s): [facility]  Does patient have an ACCT team?: Unknown Does patient have Intensive In-House Services?  : No Does patient have [facility] services? : Unknown Does patient have P4CC services?: Unknown  Past Medical History:  Past Medical History  Diagnosis Date  . Schizophrenia ([place])    History reviewed. No pertinent past surgical history. Family History: History reviewed. No pertinent family history. Family  Psychiatric  History: Unknown Social History:  History  Alcohol Use  . 1.2 oz/week  . 1 Glasses of wine, 1 Cans of beer per week     History  Drug Use  . 5.00 per week  . Special: "Crack" cocaine, Cocaine    Social History   Social History  . Marital Status: Single    Spouse Name: N/A  . Number of  Children: N/A  . Years of Education: N/A   Social History Main Topics  . Smoking status: Current Every Day Smoker -- 1.50 packs/day for 3 years    Types: Cigarettes  . Smokeless tobacco: None  . Alcohol Use: 1.2 oz/week    1 Glasses of wine, 1 Cans of beer per week  . Drug Use: 5.00 per week    Special: "Crack" cocaine, Cocaine  . Sexual Activity: Not Currently   Other Topics Concern  . None   Social History Narrative   Additional Social History:    History of alcohol / drug use?:  (Unable to assess )                     Allergies:   Allergies  Allergen Reactions  . Haldol [Haloperidol]     Pt reports he is allergic to injectable and PO haldol  . Risperidone And Related     Pt reports he is allergic to injectable and PO     Labs:  Results for orders placed or performed during the hospital encounter of 02/14/15 (from the past 48 hour(s))  Comprehensive metabolic panel     Status: Abnormal   Collection Time: 02/14/15  6:54 PM  Result Value Ref Range   Sodium 139 135 - 145 mmol/L   Potassium 4.0 3.5 - 5.1 mmol/L   Chloride 100 (L) 101 - 111 mmol/L   CO2 29 22 - 32 mmol/L   Glucose, Bld 96 65 - 99 mg/dL   BUN 11 6 - 20 mg/dL   Creatinine, Ser 0.74 0.61 - 1.24 mg/dL   Calcium 9.4 8.9 - 10.3 mg/dL   Total Protein 7.8 6.5 - 8.1 g/dL   Albumin 4.4 3.5 - 5.0 g/dL   AST 30 15 - 41 U/L   ALT 33 17 - 63 U/L   Alkaline Phosphatase 81 38 - 126 U/L   Total Bilirubin 0.8 0.3 - 1.2 mg/dL   GFR calc non Af Amer >60 >60 mL/min   GFR calc Af Amer >60 >60 mL/min    Comment: (NOTE) The eGFR has been calculated using the CKD EPI equation. This calculation has not been validated in all clinical situations. eGFR's persistently <60 mL/min signify possible Chronic Kidney Disease.    Anion gap 10 5 - 15  Ethanol (ETOH)     Status: None   Collection Time: 02/14/15  6:54 PM  Result Value Ref Range   Alcohol, Ethyl (B) <5 <5 mg/dL    Comment:        LOWEST DETECTABLE LIMIT  FOR SERUM ALCOHOL IS 5 mg/dL FOR MEDICAL PURPOSES ONLY   Salicylate level     Status: None   Collection Time: 02/14/15  6:54 PM  Result Value Ref Range   Salicylate Lvl <9.7 2.8 - 30.0 mg/dL  Acetaminophen level     Status: Abnormal   Collection Time: 02/14/15  6:54 PM  Result Value Ref Range   Acetaminophen (Tylenol), Serum <10 (L) 10 - 30 ug/mL    Comment:  THERAPEUTIC CONCENTRATIONS VARY SIGNIFICANTLY. A RANGE OF 10-30 ug/mL MAY BE AN EFFECTIVE CONCENTRATION FOR MANY PATIENTS. HOWEVER, SOME ARE BEST TREATED AT CONCENTRATIONS OUTSIDE THIS RANGE. ACETAMINOPHEN CONCENTRATIONS >150 ug/mL AT 4 HOURS AFTER INGESTION AND >50 ug/mL AT 12 HOURS AFTER INGESTION ARE OFTEN ASSOCIATED WITH TOXIC REACTIONS.   CBC with Differential     Status: None   Collection Time: 02/14/15  6:54 PM  Result Value Ref Range   WBC 10.5 4.0 - 10.5 K/uL   RBC 4.55 4.22 - 5.81 MIL/uL   Hemoglobin 13.9 13.0 - 17.0 g/dL   HCT 40.6 39.0 - 52.0 %   MCV 89.2 78.0 - 100.0 fL   MCH 30.5 26.0 - 34.0 pg   MCHC 34.2 30.0 - 36.0 g/dL   RDW 14.1 11.5 - 15.5 %   Platelets 332 150 - 400 K/uL   Neutrophils Relative % 66 %   Neutro Abs 7.0 1.7 - 7.7 K/uL   Lymphocytes Relative 23 %   Lymphs Abs 2.4 0.7 - 4.0 K/uL   Monocytes Relative 7 %   Monocytes Absolute 0.8 0.1 - 1.0 K/uL   Eosinophils Relative 4 %   Eosinophils Absolute 0.4 0.0 - 0.7 K/uL   Basophils Relative 0 %   Basophils Absolute 0.0 0.0 - 0.1 K/uL  Urine rapid drug screen (hosp performed) (Not at [facility])     Status: Abnormal   Collection Time: 02/14/15  7:17 PM  Result Value Ref Range   Opiates NONE DETECTED NONE DETECTED   Cocaine POSITIVE (A) NONE DETECTED   Benzodiazepines NONE DETECTED NONE DETECTED   Amphetamines NONE DETECTED NONE DETECTED   Tetrahydrocannabinol NONE DETECTED NONE DETECTED   Barbiturates NONE DETECTED NONE DETECTED    Comment:        DRUG SCREEN FOR MEDICAL PURPOSES ONLY.  IF CONFIRMATION IS NEEDED FOR ANY PURPOSE,  NOTIFY LAB WITHIN 5 DAYS.        LOWEST DETECTABLE LIMITS FOR URINE DRUG SCREEN Drug Class       Cutoff (ng/mL) Amphetamine      1000 Barbiturate      200 Benzodiazepine   993 Tricyclics       570 Opiates          300 Cocaine          300 THC              50     Current Facility-Administered Medications  Medication Dose Route Frequency Provider Last Rate Last Dose  . acetaminophen (TYLENOL) tablet 650 mg  650 mg Oral Q4H PRN Forde Dandy, MD      . amantadine (SYMMETREL) capsule 100 mg  100 mg Oral BID Daton Szilagyi      . clonazePAM (KLONOPIN) tablet 1 mg  1 mg Oral BID Forde Dandy, MD   1 mg at 02/15/15 1779  . ibuprofen (ADVIL,MOTRIN) tablet 600 mg  600 mg Oral Q8H PRN Forde Dandy, MD      . LORazepam (ATIVAN) tablet 1 mg  1 mg Oral Q8H PRN Forde Dandy, MD      . multivitamin with minerals tablet 1 tablet  1 tablet Oral Daily Forde Dandy, MD   1 tablet at 02/15/15 [phone]  . ondansetron (ZOFRAN) tablet 4 mg  4 mg Oral Q8H PRN Forde Dandy, MD      . Oxcarbazepine (TRILEPTAL) tablet 600 mg  600 mg Oral BID Che Rachal      . paliperidone (INVEGA) 24  hr tablet 9 mg  9 mg Oral Daily Aroura Vasudevan      . ziprasidone (GEODON) injection 10 mg  10 mg Intramuscular Once PRN Forde Dandy, MD       Current Outpatient Prescriptions  Medication Sig Dispense Refill  . clonazePAM (KLONOPIN) 1 MG tablet Take 1 tablet (1 mg total) by mouth 2 (two) times daily. 30 tablet 0  . Multiple Vitamin (MULTIVITAMIN WITH MINERALS) TABS tablet Take 1 tablet by mouth daily.    Marland Kitchen OLANZapine (ZYPREXA) 15 MG tablet Take 1 tablet (15 mg total) by mouth 2 (two) times daily after a meal. 60 tablet 0  . paliperidone (INVEGA SUSTENNA) 234 MG/1.5ML SUSP injection Inject 234 mg into the muscle once.      Musculoskeletal: Strength & Muscle Tone: within normal limits Gait & Station: normal Patient leans: N/A  Psychiatric Specialty Exam: Review of Systems  Constitutional: Negative.   HENT: Negative.    Eyes: Negative.   Respiratory: Negative.   Cardiovascular: Negative.   Gastrointestinal: Negative.   Genitourinary: Negative.   Musculoskeletal: Negative.   Skin: Negative.   Neurological: Negative.   Endo/Heme/Allergies: Negative.   Psychiatric/Behavioral: Positive for substance abuse. The patient is nervous/anxious.        Delusional    Blood pressure 158/77, pulse 104, temperature 98.4 F (36.9 C), temperature source Oral, resp. rate 18, SpO2 97 %.There is no weight on file to calculate BMI.  General Appearance: Disheveled  Eye Sport and exercise psychologist::  Fair  Speech:  Pressured  Volume:  Increased  Mood:  Euphoric and Irritable at times  Affect:  Blunt  Thought Process:  Tangential and flight of ideas  Orientation:  Full (Time, Place, and Person)  Thought Content:  Delusions  Suicidal Thoughts:  No  Homicidal Thoughts:  No  Memory:  Immediate;   Fair Recent;   Fair Remote;   Fair  Judgement:  Impaired  Insight:  Fair  Psychomotor Activity:  Increased  Concentration:  Poor  Recall:  [employer] of Knowledge:Fair  Language: Good  Akathisia:  No  Handed:  Right  AIMS (if indicated):     Assets:  Housing Leisure Time Physical Health Resilience Social Support  ADL's:  Intact  Cognition: Impaired,  Mild  Sleep:      Treatment Plan Summary: Daily contact with patient to assess and evaluate symptoms and progress in treatment, Medication management and Plan schizoaffective disorder, bipolar type severe: -Crisis stabilization -Medication management:  Invega 9 mg daily for psychosis restarted.  Amantadine 100 mg BID to prevent EPS, Klonopin 1 mg BID for anxiety, and Trileptal 600 mg BID for mood stabilization started -Individual and substance abuse counseling -Coordination with guardian and group home placement from psych ED social worker  Disposition: Recommend psychiatric Inpatient admission when medically cleared.  Waylan Boga, [place] 02/15/2015 12:26 PM Patient seen  face-to-face for psychiatric evaluation, chart reviewed and case discussed with the physician extender and developed treatment plan. Reviewed the information documented and agree with the treatment plan. Corena Pilgrim, MD

## 2015-02-15 NOTE — Progress Notes (Signed)
CSW received telephone call from [place]. Guardian wanted to know the status of patient. CSW informed guardian that we were working on group home placement and inpatient hospitalization for patient. Guardian asked if TB Skin Test could be completed. CSW informed NP and NP stated she would put in the order.   Guardian stated she had not received the discharge summary from the last discharge. CSW obtained fax# to provide guardian with this information [phone] [fax]. CSW faxed information to guardian.  CSW informed guardian that ED was working on stabilization for patient and if patient is stable before placement is found, that patient will be discharged.   Elenore PaddyLaVonia Anja Neuzil, LCSWA [phone] ED CSW 02/15/2015 1:09 PM

## 2015-02-16 MED ORDER — LORAZEPAM 2 MG/ML IJ SOLN
2.0000 mg | Freq: Once | INTRAMUSCULAR | Status: AC
  Administered 2015-02-16: 2 mg via INTRAMUSCULAR
  Filled 2015-02-16: qty 1

## 2015-02-16 MED ORDER — ZIPRASIDONE MESYLATE 20 MG IM SOLR
20.0000 mg | Freq: Once | INTRAMUSCULAR | Status: AC
  Administered 2015-02-16: 20 mg via INTRAMUSCULAR
  Filled 2015-02-16: qty 20

## 2015-02-16 MED ORDER — NICOTINE POLACRILEX 2 MG MT GUM
2.0000 mg | CHEWING_GUM | OROMUCOSAL | Status: DC | PRN
  Administered 2015-02-16 – 2015-02-28 (×8): 2 mg via ORAL
  Filled 2015-02-16 (×7): qty 1

## 2015-02-16 MED ORDER — DIPHENHYDRAMINE HCL 50 MG/ML IJ SOLN
50.0000 mg | Freq: Once | INTRAMUSCULAR | Status: AC
  Administered 2015-02-16: 50 mg via INTRAMUSCULAR
  Filled 2015-02-16: qty 1

## 2015-02-16 NOTE — ED Notes (Signed)
Pt awake, alert & responsive, no distress noted, very labile, requesting a snack at present. 8pm snack given.  Pt in room watching TV at present.  Monitoring for safety, Q 15 min checks in effect.

## 2015-02-16 NOTE — ED Notes (Signed)
Pt talking aggressively, rambling, making Karate moves, pacing hallways, increasing agitation.

## 2015-02-16 NOTE — BH Assessment (Signed)
Patient was reassessed by TTS.   Patient up at the nurses station attempting to speak with the nurse through the glass. Patients speech is rapid, pressured, and nonsensical. Patient denies SI/HI and states that he is "here to love" and then started to recite possible Bible verses and touched his forehead, chest, and then each shoulder to make a cross. Patient states "hold on for a second, I'm talking to her." Then patient asked this writer if she was married and asked to see her hand and ring and started to [facility] and walked away.   Patient continues to meet inpatient criteria per Dr. Jama Flavorsobos based on patients erratic behavior.    Davina PokeJoVea Abdias Hickam, LCSW Therapeutic Triage Specialist [facility] 02/16/2015 1:21 PM

## 2015-02-16 NOTE — ED Notes (Signed)
Patient agitated and impossible to redirect.  Loud,cursing and beligerent. Security and [employer] in for assist.

## 2015-02-16 NOTE — ED Notes (Signed)
Awake. To the restroom. Requested coffee, juice, snack. Gave patient 1 pack of graham crackers with peanut butter and water. Explained time and encouraged patient to relax. Adjusted t.v. Patient inquired about the date and asked about visitors and when he could leave.

## 2015-02-17 DIAGNOSIS — F250 Schizoaffective disorder, bipolar type: Secondary | ICD-10-CM | POA: Diagnosis not present

## 2015-02-17 DIAGNOSIS — F29 Unspecified psychosis not due to a substance or known physiological condition: Secondary | ICD-10-CM | POA: Diagnosis not present

## 2015-02-17 DIAGNOSIS — F1420 Cocaine dependence, uncomplicated: Secondary | ICD-10-CM | POA: Diagnosis not present

## 2015-02-17 MED ORDER — DIPHENHYDRAMINE HCL 50 MG/ML IJ SOLN
25.0000 mg | Freq: Once | INTRAMUSCULAR | Status: AC
  Administered 2015-02-17: 25 mg via INTRAMUSCULAR
  Filled 2015-02-17: qty 1

## 2015-02-17 MED ORDER — LORAZEPAM 2 MG/ML IJ SOLN
1.0000 mg | Freq: Once | INTRAMUSCULAR | Status: AC
  Administered 2015-02-17: 1 mg via INTRAMUSCULAR
  Filled 2015-02-17: qty 1

## 2015-02-17 MED ORDER — QUETIAPINE FUMARATE 100 MG PO TABS
100.0000 mg | ORAL_TABLET | Freq: Every day | ORAL | Status: DC
  Administered 2015-02-17: 100 mg via ORAL
  Filled 2015-02-17: qty 1

## 2015-02-17 MED ORDER — LORAZEPAM 1 MG PO TABS: 1.0000 mg | ORAL_TABLET | Freq: Once | ORAL | Status: AC

## 2015-02-17 MED ORDER — STERILE WATER FOR INJECTION IJ SOLN
INTRAMUSCULAR | Status: AC
  Filled 2015-02-17: qty 10

## 2015-02-17 MED ORDER — ZIPRASIDONE MESYLATE 20 MG IM SOLR
INTRAMUSCULAR | Status: AC
  Filled 2015-02-17: qty 20

## 2015-02-17 MED ORDER — ZIPRASIDONE MESYLATE 20 MG IM SOLR
10.0000 mg | Freq: Once | INTRAMUSCULAR | Status: AC
  Administered 2015-02-17: 10 mg via INTRAMUSCULAR

## 2015-02-17 NOTE — Consult Note (Signed)
Psychiatric Specialty Exam: Physical Exam  ROS  Blood pressure 123/86, pulse 102, temperature 98.1 F (36.7 C), temperature source Oral, resp. rate 22, SpO2 99 %.There is no weight on file to calculate BMI.  General Appearance: Disheveled  Eye SolicitorContact::  Fair  Speech:  Pressured  Volume:  Increased  Mood:  Euphoric and Irritable at times  Affect:  Blunt  Thought Process:  Tangential and flight of ideas  Orientation:  Full (Time, Place, and Person)  Thought Content:  Delusions  Suicidal Thoughts:  No  Homicidal Thoughts:  No  Memory:  Immediate;   Fair Recent;   Fair Remote;   Fair  Judgement:  Impaired  Insight:  Fair  Psychomotor Activity:  Increased  Concentration:  Poor  Recall:  [employer]Fair  Fund of Knowledge:Fair  Language: Good  Akathisia:  No  Handed:  Right  AIMS (if indicated):     Assets:  Housing Leisure Time Physical Health Resilience Social Support  ADL's:  Intact  Cognition: Impaired,  Mild     Patient remains very disorganized, continuously talking and yelling.  Patient lacks insight in his mental illness and constantly asked for discharge home.  Patient states she has no reason staying in the unit but exhibits FOI, .  Patient follows staff wanting to come into the provider room or Nursing station.  Patient constantly want to shake peoples hands and needing redirecting.  He does not keep to his boundary and and gets into people's space.  He denies SI/HI/AVH.   Schizoaffective disorder, bipolar type [facility](HCC)   Plan:  Continue seeking inpatient hospitalization, he is on the waiting list for [facility].  Dahlia ByesJosephine Onuoha   PMHNP-BC Agree with NP note and assessment , as above

## 2015-02-17 NOTE — ED Notes (Signed)
Patient is hyper verbal.  He is intrusive by standing at nurses station constantly talking and walking past patient rooms and talking or looking in.  He is not very redirectable as he gets very agitated when spoken to.  15 minute checks and video monitoring continue.

## 2015-02-17 NOTE — ED Notes (Signed)
Patient remains disorganized and restless. He would go to his room rest for average and come back to the medication window to demand for food or coffee or soda. Safety maintained.

## 2015-02-17 NOTE — Progress Notes (Signed)
Patient in bed sleeping at the beginning of the shift. When he woke up, he said he has been sleeping since yesterday and missed all his meals. Pt only slept for couple of hour after receiving IM medications for agitation protocol. He requested for full meal. Writer told patient that [facility] for [facility] and offered 2 sandwiches crackers and peanut butter. Shortly after that, Clinical research associatewriter offered patient his HS medications. Patient demanding for more food, banging window when he was told he already had some, threatening staff. Writer notified the security.

## 2015-02-18 DIAGNOSIS — F250 Schizoaffective disorder, bipolar type: Secondary | ICD-10-CM | POA: Diagnosis not present

## 2015-02-18 DIAGNOSIS — F1420 Cocaine dependence, uncomplicated: Secondary | ICD-10-CM | POA: Diagnosis not present

## 2015-02-18 DIAGNOSIS — F29 Unspecified psychosis not due to a substance or known physiological condition: Secondary | ICD-10-CM | POA: Diagnosis not present

## 2015-02-18 MED ORDER — OLANZAPINE 10 MG PO TABS
10.0000 mg | ORAL_TABLET | Freq: Once | ORAL | Status: AC
  Administered 2015-02-18: 10 mg via ORAL
  Filled 2015-02-18: qty 1

## 2015-02-18 MED ORDER — DIPHENHYDRAMINE HCL 25 MG PO CAPS
50.0000 mg | ORAL_CAPSULE | Freq: Once | ORAL | Status: AC
  Administered 2015-02-18: 50 mg via ORAL
  Filled 2015-02-18: qty 2

## 2015-02-18 MED ORDER — ASENAPINE MALEATE 5 MG SL SUBL: 5.0000 mg | SUBLINGUAL_TABLET | Freq: Once | SUBLINGUAL | Status: DC

## 2015-02-18 MED ORDER — LORAZEPAM 1 MG PO TABS
2.0000 mg | ORAL_TABLET | Freq: Once | ORAL | Status: AC
  Administered 2015-02-18: 2 mg via ORAL
  Filled 2015-02-18: qty 2

## 2015-02-18 MED ORDER — ASENAPINE MALEATE 5 MG SL SUBL
5.0000 mg | SUBLINGUAL_TABLET | Freq: Once | SUBLINGUAL | Status: AC
  Administered 2015-02-18: 5 mg via SUBLINGUAL
  Filled 2015-02-18: qty 1

## 2015-02-18 NOTE — Consult Note (Signed)
[facility] Face-to-Face Psychiatry Consult   Reason for Consult:  Mania  Referring Physician:  EDP Patient Identification: Gregory Meza MRN:  [id number] Principal Diagnosis: Schizoaffective disorder, bipolar type [facility]) Diagnosis:   Patient Active Problem List   Diagnosis Date Noted  . Cocaine use disorder, moderate, dependence (HCC) [F14.20] 09/25/2014    Priority: High  . Schizoaffective disorder, bipolar type (HCC) [F25.0]     Priority: High  . Psychoses [F29]   . Noncompliance with medication regimen [Z91.14]   . Cannabis use disorder, moderate, dependence (HCC) [F12.20] 09/25/2014  . Substance abuse [F19.10] 03/29/2012    Class: Chronic  . Non-compliant patient [Z91.19] 03/29/2012    Class: Chronic    Total Time spent with patient: 30 minutes  Subjective:   Gregory Meza is a 50 y.o. male patient admitted with bipolar disorder.  HPI:  Patient continues to be hyperverbal and rambles, intrusive with staff.  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  Continues to stay at the nursing station with frequent demands.  Agitation continued to increase until he required PRN medications to calm down.  Past Psychiatric History: Schizoaffective disorder, cocaine abuse  Risk to Self: Suicidal Ideation:  (unable to assess ) Suicidal Intent:  (Unable to assess ) Is patient at risk for suicide?:  (Unable to assess ) Suicidal Plan?:  (Unable to assess ) Access to Means:  (Unable to assess ) What has been your use of drugs/alcohol within the last 12 months?: UDS is positive for cocaine and alcohol  How many times?:  (Unable to assess ) Other Self Harm Risks:  (Unable to assess ) Triggers for Past Attempts:  (UTA) Intentional Self Injurious Behavior:  (Unable to assess ) Risk to Others: Homicidal Ideation:  (Unable to assess ) Thoughts of Harm to Others:  (Unable to assess ) Current Homicidal Intent:  (Unable to assess ) Current Homicidal Plan:  (Unable to  assess ) Access to Homicidal Means:  (Unable to assess ) Identified Victim:  (Unable to assess ) History of harm to others?:  (Unable to assess ) Assessment of Violence: None Noted Violent Behavior Description: No violent behaviors observed. Pt is calm at this time.  Does patient have access to weapons?:  (Unable to assess) Criminal Charges Pending?:  (Unable to assess ) Does patient have a court date:  (Unable to assess ) Prior Inpatient Therapy: Prior Inpatient Therapy: Yes Prior Therapy Dates: 2014,2015, 2016 Prior Therapy Facilty/Provider(s): [facility]  Reason for Treatment: Schizophrenia and cocaine  Prior Outpatient Therapy: Prior Outpatient Therapy: Yes Prior Therapy Dates: Current Prior Therapy Facilty/Provider(s): [facility]  Does patient have an ACCT team?: Unknown Does patient have Intensive In-House Services?  : No Does patient have [facility] services? : Unknown Does patient have P4CC services?: Unknown  Past Medical History:  Past Medical History  Diagnosis Date  . Schizophrenia (HCC)    History reviewed. No pertinent past surgical history. Family History: History reviewed. No pertinent family history. Family Psychiatric  History: None Social History:  History  Alcohol Use  . 1.2 oz/week  . 1 Glasses of wine, 1 Cans of beer per week     History  Drug Use  . 5.00 per week  . Special: "Crack" cocaine, Cocaine    Social History   Social History  . Marital Status: Single    Spouse Name: N/A  . Number of Children: N/A  . Years of Education: N/A   Social History Main Topics  .  Smoking status: Current Every Day Smoker -- 1.50 packs/day for 3 years    Types: Cigarettes  . Smokeless tobacco: None  . Alcohol Use: 1.2 oz/week    1 Glasses of wine, 1 Cans of beer per week  . Drug Use: 5.00 per week    Special: "Crack" cocaine, Cocaine  . Sexual Activity: Not Currently   Other Topics Concern  . None   Social History Narrative   Additional Social History:     History of alcohol / drug use?:  (Unable to assess )                     Allergies:   Allergies  Allergen Reactions  . Haldol [Haloperidol]     Pt reports he is allergic to injectable and PO haldol  . Risperidone And Related     Pt reports he is allergic to injectable and PO     Labs: No results found for this or any previous visit (from the past 48 hour(s)).  Current Facility-Administered Medications  Medication Dose Route Frequency Provider Last Rate Last Dose  . acetaminophen (TYLENOL) tablet 650 mg  650 mg Oral Q4H PRN Lavera Guise, MD      . amantadine (SYMMETREL) capsule 100 mg  100 mg Oral BID Tynslee Bowlds   100 mg at 02/18/15 0957  . clonazePAM (KLONOPIN) tablet 1 mg  1 mg Oral BID Lavera Guise, MD   1 mg at 02/18/15 0957  . ibuprofen (ADVIL,MOTRIN) tablet 600 mg  600 mg Oral Q8H PRN Lavera Guise, MD      . multivitamin with minerals tablet 1 tablet  1 tablet Oral Daily Lavera Guise, MD   1 tablet at 02/18/15 0957  . nicotine polacrilex (NICORETTE) gum 2 mg  2 mg Oral PRN Earney Navy, NP   2 mg at 02/18/15 0530  . ondansetron (ZOFRAN) tablet 4 mg  4 mg Oral Q8H PRN Lavera Guise, MD      . Oxcarbazepine (TRILEPTAL) tablet 600 mg  600 mg Oral BID Lavell Supple   600 mg at 02/18/15 0957  . paliperidone (INVEGA) 24 hr tablet 9 mg  9 mg Oral Daily Johnothan Bascomb   9 mg at 02/18/15 0957  . QUEtiapine (SEROQUEL) tablet 100 mg  100 mg Oral QHS Worthy Flank, NP   100 mg at 02/17/15 2145   Current Outpatient Prescriptions  Medication Sig Dispense Refill  . clonazePAM (KLONOPIN) 1 MG tablet Take 1 tablet (1 mg total) by mouth 2 (two) times daily. 30 tablet 0  . Multiple Vitamin (MULTIVITAMIN WITH MINERALS) TABS tablet Take 1 tablet by mouth daily.    Marland Kitchen OLANZapine (ZYPREXA) 15 MG tablet Take 1 tablet (15 mg total) by mouth 2 (two) times daily after a meal. 60 tablet 0  . paliperidone (INVEGA SUSTENNA) 234 MG/1.5ML SUSP injection Inject 234 mg into the muscle  once.      Musculoskeletal: Strength & Muscle Tone: within normal limits Gait & Station: normal Patient leans: N/A  Psychiatric Specialty Exam: Review of Systems  Constitutional: Negative.   HENT: Negative.   Eyes: Negative.   Respiratory: Negative.   Cardiovascular: Negative.   Gastrointestinal: Negative.   Genitourinary: Negative.   Musculoskeletal: Negative.   Skin: Negative.   Neurological: Negative.   Endo/Heme/Allergies: Negative.   Psychiatric/Behavioral: The patient is nervous/anxious.        Delusions, hypomania    Blood pressure 128/93, pulse 93, temperature 97.3 F (  36.3 C), temperature source Oral, resp. rate 20, SpO2 97 %.There is no weight on file to calculate BMI.  General Appearance: Casual  Eye Contact::  Good  Speech:  Pressured  Volume:  Increased  Mood:  Anxious and Irritable  Affect:  Blunt  Thought Process:  Tangential  Orientation:  Full (Time, Place, and Person)  Thought Content:  Delusions, Obsessions and Rumination  Suicidal Thoughts:  No  Homicidal Thoughts:  No  Memory:  Immediate;   Fair Recent;   Fair Remote;   Fair  Judgement:  Impaired  Insight:  Lacking  Psychomotor Activity:  Increased  Concentration:  Fair  Recall:  FiservFair  Fund of Knowledge:Fair  Language: Fair  Akathisia:  No  Handed:  Right  AIMS (if indicated):     Assets:  Financial Resources/Insurance Leisure Time Physical Health Resilience Social Support  ADL's:  Intact  Cognition: Impaired,  Moderate  Sleep:      Treatment Plan Summary: Daily contact with patient to assess and evaluate symptoms and progress in treatment, Medication management and Plan schizoaffective disorder, bipolar type, severe:  -Crisis stabilization -Medication management:  Zyprexa 10 mg once for agitation along with Benadryl 50 mg po and Ativan 2 mg po for agitation given.  Continue Invega 9 mg for psychosis, Seroquel 100 mg at bedtime for sleep and psychosis, Klonopin 1 mg BID for anxiety,  Amantadine 100 mg BID to prevent EPS, and Trileptal 600 mg BID for mood stabilization -Individual counseling  Disposition: Recommend psychiatric Inpatient admission when medically cleared.  Nanine MeansLORD, JAMISON, PMH-NP 02/18/2015 4:17 PM Patient seen face-to-face for psychiatric evaluation, chart reviewed and case discussed with the physician extender and developed treatment plan. Reviewed the information documented and agree with the treatment plan. Thedore MinsMojeed Clarise Chacko, MD

## 2015-02-18 NOTE — BH Assessment (Addendum)
[facility] Assessment Progress Note  At 10:00 [facility] at [facility] pt [facility] on wait list.  Doylene Canninghomas Vallen Calabrese, MA Triage Specialist [phone]

## 2015-02-18 NOTE — ED Notes (Signed)
PPD negative at 0 (zero) millimeters.

## 2015-02-18 NOTE — Progress Notes (Signed)
9:30a- CSW spoke with Gregory Meza, Director, [facility], [place], [place] [phone] to see if she reviewed information sent on Friday regarding placement. Director stated she reviewed the information and she would allow patient to come to her agency. Director stated she would not need any other information regarding patient at this time.   11:30a- CSW spoke with Director of [facility] to ensure patient being allowed to come to her facility. Director stated patient would be allowed to come to her group home. Director stated patient would be allowed to come on Wednesday, as she is preparing to clear a room and have another persons belongings removed. Director stated she does have three to four other smokers at her facility. Director stated she is licensed for six individuals in her group home. Director was inquired about a note stating patient being allowed unsupervised time. CSW informed Director that a note would not be able to be provided for unsupervised time.   11:40a- called the Extended Services Line [facility] to provide an update on patient. CSW left name and contact information with a gentleman for guardian to call CSW.  11:45a- CSW received a telephone call from Gregory Meza, CPS Social Worker, [employer]. CSW informed SW of information for guardian to be up-to-date on arrangements regarding patient. CSW informed SW that Gregory Meza, Director at [facility], [place], [place] has stated that she is willing to accept patient on Wednesday. CSW asked SW to note to guardian about transportation for patient to the group home.  Gregory Meza, LCSWA [phone] ED CSW 02/18/2015 11:59 AM

## 2015-02-18 NOTE — Progress Notes (Signed)
D Pt. Denies SI and HI, and is asking when he will be able to discharge.  Pt. Is pleasant at present time.  A Writer offered support and encouragement.  R Pt. Remains safe on the unit. Pt. Accepted his HS medications without incident but did refuse his VS's.

## 2015-02-18 NOTE — ED Notes (Signed)
Patient remain disorganized, agitated and delusional. He kept demanding for something to eat. Staff gave patient warm milk , cracker and cheese and patient went to sleep after that.

## 2015-02-18 NOTE — ED Notes (Signed)
Writer went in and asked if vitals could be taken patient said I told you this morning no I explained I was not here he said well I ma telling you this evening no vitals  to be taken

## 2015-02-18 NOTE — ED Notes (Signed)
Patient at the window requesting for food and soda again. Staff offered patient  Water and encouraged him to go back to his room and lay down and get some rest before day breaks.

## 2015-02-18 NOTE — ED Notes (Signed)
Patient has been rude, intrusive, demanding, tangential and disruptive most of the shift.  He has been yelling at staff and patients.  He has been demanding snacks several times throughout the day and has also received double portions with his meals.  He is taking all PO medications as ordered.  Frequent requests for discharge despite being [facility].  He has been pacing in the hallway much of the day.

## 2015-02-18 NOTE — Progress Notes (Addendum)
CSW spoke with [name], Director, [facility], [place], [place] [phone]. Director wanted to know when patient would be discharged. CSW informed Director that the ED was making attempts to find placement for patient.   Director stated she would call to speak with guardian and she would call CSW back with information.  Elenore PaddyLaVonia Marek Nghiem, LCSWA [phone] ED CSW 02/18/2015 9:04 AM

## 2015-02-19 MED ORDER — OLANZAPINE 5 MG PO TBDP
5.0000 mg | ORAL_TABLET | Freq: Every day | ORAL | Status: DC
  Administered 2015-02-19 (×2): 5 mg via ORAL
  Filled 2015-02-19 (×2): qty 1

## 2015-02-19 MED ORDER — DIPHENHYDRAMINE HCL 25 MG PO CAPS
25.0000 mg | ORAL_CAPSULE | Freq: Once | ORAL | Status: AC
  Administered 2015-02-19: 25 mg via ORAL
  Filled 2015-02-19: qty 1

## 2015-02-19 NOTE — Progress Notes (Signed)
CSW 1st shift and 2nd shift spoke with [name], [facility], Director [phone]. Director stated she has not heard from the guardian of the individual that was going to be leaving her facility. Director stated she is not certain if she would be able to offer patient a bed at this time. Director stated to seek other placements for patient.   Elenore PaddyLaVonia Victoriana Aziz, LCSWA [phone] ED CSW 02/19/2015 4:10 PM

## 2015-02-19 NOTE — Progress Notes (Signed)
CSW spoke with [name], Director, [facility]. Director stated she wants to ensure patient has been stabilized by pysc and that he is receiving proper medications. Director stated she wants to ensure patient is able to be managed around others in the group home. Director also stated she would need a new FL2 faxed with the doctor's signature. Director stated no signature was noted on the previous FL2 sent last week. Fax# [phone]. No other information noted for CSW at this time.   Elenore PaddyLaVonia Brunette Lavalle, LCSWA [phone] ED CSW 02/19/2015 10:20 AM

## 2015-02-19 NOTE — Progress Notes (Signed)
CSW received a telephone call from [facility], [facility] Guardian [phone]. Guardian wanted to know if patient will have a follow-up appointment upon discharge for pysc. CSW will follow-up for a pysc appointment.   Guardian also stated she spoke with Mrs. Glyn AdeFrances Meza, Director, [facility], [place], [place]. Guardian stated the Director informed her that the person she had in a room has not left at this time and she was going to place patient in the that room.   Guardian asked CSW if patient had his apartment key. CSW spoke with nurse and nurse checked patient's belongings. Nurse stated she only located a key for a suit case. CSW called guardian back to inform her of this information.   Gregory Meza, LCSWA [phone] ED CSW 02/19/2015 3:05 PM

## 2015-02-19 NOTE — Progress Notes (Signed)
CSW spoke with Mitchel [name], Supervisor, [facility] and Cottie Bandaontay Woolard, Adult [facility] [employer] Guardian via phone regarding patient. Supervisor stated she placed CSW on speaker phone for guardian to hear the conversation. CSW informed supervisor and guardian that CSW spoke with Leonette Mostharles Key yesterday to make the agency aware that patient was accepted by [facility], [place], [place], [name], Director. Supervisor stated she was on call, however, she did not receive a call from ALLTEL CorporationCharles Key with the updated information.   CSW informed supervisor and guardian that Director of the group home stated patient can come on Wednesday, as she was clearing a room for him in the home. CSW informed supervisor and guardian that the Director wanted a note from the doctor for unsupervised time for patient. CSW informed supervisor and guardian that the doctor will not provide a note for unsupervised time and they may want to speak with the Director concerning this matter. CSW also informed supervisor and guardian that patient would need transportation from the ED to the group home. DSS Supervisor stated they would call and speak with the Director at the group home concerning this information.  Elenore PaddyLaVonia Aimie Wagman, LCSWA [phone] ED CSW 02/19/2015 9:23 AM

## 2015-02-19 NOTE — BH Assessment (Addendum)
Re-assessment:    Writer met with patient face to face to complete a re-assessment. Patient denies SI, HI, and AVH's today. Patient asked me to write on my paper "S-U-I-T-E" and "L-A-W". Writer verified that patient was trying to spell "LAWSUIT". Patient goes on a tangent to state that he is suing everyone in the hospital if he is not discharged today. Patient is agitated and rants about people taking his money. Patient is intrusive during our interaction and approaches this writer putting his finger on the paperwork that is in my hand. Writer ended the TTS consult as patient became increasing intrusive to this Probation officer.

## 2015-02-19 NOTE — ED Notes (Signed)
Patient observed in ambulating in hall and in room. Patient unable to state why he is here. He focuses on religion. Patient redirectable and mood is pleasant at this time. Patient has flight of ideas with his conversation and is not easy to follow. Patient denies SI/HI and A/V hallucinations but does appear to be responding to internal stimuli. Q 15 minute checks in progress and maintained and monitoring continues.

## 2015-02-19 NOTE — BH Assessment (Signed)
[facility] Assessment Progress Note  At 08:37 Sonya at [facility] reports that pt remains on their wait list.  Sholom Dulude, MA Triage Specialist [phone]     

## 2015-02-19 NOTE — ED Notes (Signed)
Patient up at nurse's station singing and rapping.  He has made several requests for telephone numbers.  He remains labile and at times, can be irritable.  He is compliant with his medications.  Patient was very pleasant this morning, however, is becoming more irritable when his requests are denied.

## 2015-02-19 NOTE — ED Notes (Signed)
Patient up at nurse's station.  Patient started cursing staff; following behind staff.  Patient intrusive and getting into other patient's business.  Patient following patient's and saying inappropriate statements to them.  Patient continued to escalate.  zyprexa 5 mg and benedryl 25 mg was given with good results.

## 2015-02-20 MED ORDER — OXCARBAZEPINE 300 MG PO TABS
600.0000 mg | ORAL_TABLET | Freq: Three times a day (TID) | ORAL | Status: DC
  Administered 2015-02-20 – 2015-02-28 (×25): 600 mg via ORAL
  Filled 2015-02-20 (×26): qty 2

## 2015-02-20 MED ORDER — OLANZAPINE 10 MG PO TBDP
10.0000 mg | ORAL_TABLET | Freq: Three times a day (TID) | ORAL | Status: DC | PRN
Start: 2015-02-20 — End: 2015-02-22
  Administered 2015-02-20 – 2015-02-21 (×4): 10 mg via ORAL
  Filled 2015-02-20 (×4): qty 1

## 2015-02-20 MED ORDER — CLONAZEPAM 1 MG PO TABS
1.0000 mg | ORAL_TABLET | Freq: Three times a day (TID) | ORAL | Status: DC
  Administered 2015-02-20 – 2015-02-28 (×24): 1 mg via ORAL
  Filled 2015-02-20 (×23): qty 1

## 2015-02-20 NOTE — [place] FL2 (Signed)
[place] MEDICAID FL2 LEVEL OF CARE SCREENING TOOL     IDENTIFICATION  Patient Name: Gregory Meza Birthdate: [birth year] Sex: male Admission Date (Current Location): 02/14/2015  [facility] [place]   Facility and Address:  [facility],  [address], [place] [id number]      Provider Number: [id number]  Attending Physician Name and Address:  Provider Default, MD  Relative Name and Phone Number:       Current Level of Care: Hospital Recommended Level of Care: Other (Comment) [facility] Prior Approval Number:    Date Approved/Denied:   PASRR Number:    Discharge Plan: Other (Comment) [facility])    Current Diagnoses: Patient Active Problem List   Diagnosis Date Noted  . Psychoses   . Noncompliance with medication regimen   . Cocaine use disorder, moderate, dependence (HCC) 09/25/2014  . Cannabis use disorder, moderate, dependence (HCC) 09/25/2014  . Schizoaffective disorder, bipolar type (HCC)   . Substance abuse 03/29/2012    Class: Chronic  . Non-compliant patient 03/29/2012    Class: Chronic    Orientation RESPIRATION BLADDER Height & Weight    Self  Other (Comment) (Room Air) Continent   241 lbs.  BEHAVIORAL SYMPTOMS/MOOD NEUROLOGICAL BOWEL NUTRITION STATUS  Other (Comment) (Impulsive, irritable, rapid cycling mood, resistant to take medications)   Continent Diet (Regular room appropriate)  AMBULATORY STATUS COMMUNICATION OF NEEDS Skin   Independent Verbally Normal                       Personal Care Assistance Level of Assistance  Feeding, Dressing, Bathing Bathing Assistance: Independent Feeding assistance: Independent Dressing Assistance: Independent     Functional Limitations Info  Sight Sight Info: Adequate Hearing Info: Adequate      SPECIAL CARE FACTORS FREQUENCY                       Contractures      Additional Factors Info  Code Status, Allergies (Full  Code) Code Status Info:  (Full Code) Allergies Info:  (Haldol, Risperidone And Related)           Current Medications (02/20/2015):  This is the current hospital active medication list Current Facility-Administered Medications  Medication Dose Route Frequency Provider Last Rate Last Dose  . acetaminophen (TYLENOL) tablet 650 mg  650 mg Oral Q4H PRN Lavera Guiseana Duo Liu, MD      . amantadine (SYMMETREL) capsule 100 mg  100 mg Oral BID Andrea Ferrer   100 mg at 02/20/15 0927  . clonazePAM (KLONOPIN) tablet 1 mg  1 mg Oral BID Lavera Guiseana Duo Liu, MD   1 mg at 02/20/15 [id number]  . ibuprofen (ADVIL,MOTRIN) tablet 600 mg  600 mg Oral Q8H PRN Lavera Guiseana Duo Liu, MD      . multivitamin with minerals tablet 1 tablet  1 tablet Oral Daily Lavera Guiseana Duo Liu, MD   1 tablet at 02/20/15 [id number]  . nicotine polacrilex (NICORETTE) gum 2 mg  2 mg Oral PRN Earney NavyJosephine C Onuoha, NP   2 mg at 02/19/15 0610  . OLANZapine zydis (ZYPREXA) disintegrating tablet 5 mg  5 mg Oral QHS Earney NavyJosephine C Onuoha, NP   5 mg at 02/19/15 2127  . ondansetron (ZOFRAN) tablet 4 mg  4 mg Oral Q8H PRN Lavera Guiseana Duo Liu, MD      . Oxcarbazepine (TRILEPTAL) tablet 600 mg  600 mg Oral BID Gabriellia Rempel   600 mg at  02/20/15 0927  . paliperidone (INVEGA) 24 hr tablet 9 mg  9 mg Oral Daily Kortny Lirette   9 mg at 02/20/15 1610   Current Outpatient Prescriptions  Medication Sig Dispense Refill  . clonazePAM (KLONOPIN) 1 MG tablet Take 1 tablet (1 mg total) by mouth 2 (two) times daily. 30 tablet 0  . Multiple Vitamin (MULTIVITAMIN WITH MINERALS) TABS tablet Take 1 tablet by mouth daily.    Marland Kitchen OLANZapine (ZYPREXA) 15 MG tablet Take 1 tablet (15 mg total) by mouth 2 (two) times daily after a meal. 60 tablet 0  . paliperidone (INVEGA SUSTENNA) 234 MG/1.5ML SUSP injection Inject 234 mg into the muscle once.       Discharge Medications: Please see discharge summary for a list of discharge medications.  Relevant Imaging Results:  Relevant Lab Results:   Additional  Information    Claudean Severance, LCSW   Thedore Mins, MD 03/23/2015 10:06 AM

## 2015-02-20 NOTE — BH Assessment (Signed)
[facility] Assessment Progress Note  At 12:32 [facility] on their wait list.  Doylene Canninghomas Armonii Sieh, MA Triage Specialist [phone]

## 2015-02-20 NOTE — Progress Notes (Addendum)
CSW received a telephone call from [employer] Bath [employer]ontay Woolard, [place], [place]. Guardian stated she had three more contacts for CSW to contact regarding placement for patient: 1. Terrilyn Saverngie Harris, fax# [phone] 2. Darryll CapersJames Barnes, fax# [phone] and 3. Lucio EdwardKudzai Mabunda, fax# [phone], phone# [phone]. Guardian also provided CSW with two contact numbers for Nada LibmanSherry Crisp, [place] and [facility], [place], [place].  Guardian also called to provide CSW with information to contact [facility], [place], [place], contact person is Ursella [phone].  Guardian stated she continues to seek mental health facilities regarding placement for patient.   Elenore PaddyLaVonia Tres Grzywacz, LCSWA [phone] ED CSW 02/20/2015 2:28 PM

## 2015-02-20 NOTE — Progress Notes (Signed)
CSW spoke with Tree surgeonyvette at [facility], [place], [place]. She stated their facility is a group home. She stated they have three group homes for men and one for women. She stated they have day programs, outings/activities for clients, around the clock care, and church on Sunday for anyone that would like to attend.   CSW stated Nada LibmanSherry Crisp would be in tomorrow morning. CSW informed Eyvette FL2 could be faxed when signed by the doctor tomorrow.   Elenore PaddyLaVonia Jadelin Eng, LCSWA [phone] ED CSW 02/20/2015 3:24 PM

## 2015-02-20 NOTE — ED Notes (Signed)
Pt presents pacing the hallway. Disorganized, delusional. Responding to internal stimuli, flight of ideas,bizarre behavior, but redirectable. Compliant with morning medication regimen. Will continue to monitor.

## 2015-02-20 NOTE — Consult Note (Signed)
Psychiatric Specialty Exam: Physical Exam  ROS  Blood pressure 130/90, pulse 116, temperature 98.3 F (36.8 C), temperature source Oral, resp. rate 20, SpO2 90 %.There is no weight on file to calculate BMI.  General Appearance: Casual  Eye Contact::  Good  Speech:  Clear and Coherent and Normal Rate  Volume:  Normal  Mood:  Irritable  Affect:  Congruent  Thought Process:  Disorganized and Loose  Orientation:  Full (Time, Place, and Person)  Thought Content:  Delusions and Paranoid Ideation  Suicidal Thoughts:  No  Homicidal Thoughts:  No  Memory:  Immediate;   Fair Recent;   Fair Remote;   Fair  Judgement:  Poor  Insight:  Shallow  Psychomotor Activity:  Restlessness  Concentration:  Poor  Recall:  Poor  Fund of Knowledge:  Poor  Language:  Fair  Akathisia:  No  Handed:  Right  AIMS (if indicated):     Assets:  Desire for Improvement Housing  ADL's:  Intact  Cognition:  Impaired,  Severe     Patient could not engage and participate in the interview.  Patient today wanted to use sign language to communicate with staff and providers.  He is constantly pacing the hallway and is not able to stay in his room.  Patient invades other peoples space and does not have boundaries.  Patient woke up 4 am and could not fall back to sleep and has been walking back and forth from his room to the nursing station and following other patients to their room to talk to them..  Patient reports good appetite at this time.  He denies SI/HI/AVH.  Schizoaffective disorder, bipolar type (HCC)   PLAN:  Continue seeking [facility] admission.  We will offer Olanzapine 10 mg po every 8 hours as needed for agitation.  Dahlia ByesJosephine Onuoha    PMHNP-BC  Patient seen face-to-face for psychiatric evaluation, chart reviewed and case discussed with the physician extender and developed treatment plan. Reviewed the information documented and agree with the treatment plan. Thedore MinsMojeed Truc Winfree, MD

## 2015-02-20 NOTE — [place] FL2 (Signed)
[place] MEDICAID FL2 LEVEL OF CARE SCREENING TOOL     IDENTIFICATION  Patient Name: Gregory Meza Birthdate: 04-06-1965 Sex: male Admission Date (Current Location): 02/14/2015  [facility] [place]   Facility and Address:  [facility],  [address], [place] [id number]      Provider Number: [id number]  Attending Physician Name and Address:  Provider Default, MD  Relative Name and Phone Number:       Current Level of Care: Hospital Recommended Level of Care: Other (Comment) [facility]) Prior Approval Number:    Date Approved/Denied:   PASRR Number:    Discharge Plan: Other (Comment) [facility])    Current Diagnoses: Patient Active Problem List   Diagnosis Date Noted  . Psychoses   . Noncompliance with medication regimen   . Cocaine use disorder, moderate, dependence (HCC) 09/25/2014  . Cannabis use disorder, moderate, dependence (HCC) 09/25/2014  . Schizoaffective disorder, bipolar type (HCC)   . Substance abuse 03/29/2012    Class: Chronic  . Non-compliant patient 03/29/2012    Class: Chronic    Orientation RESPIRATION BLADDER Height & Weight    Self  Other (Comment) (Room Air) Continent   241 lbs.  BEHAVIORAL SYMPTOMS/MOOD NEUROLOGICAL BOWEL NUTRITION STATUS  Other (Comment) (Impulsive, irritable, rapid cycling mood, resistant to take medications)   Continent Diet (Regular room appropriate)  AMBULATORY STATUS COMMUNICATION OF NEEDS Skin   Independent Verbally Normal                       Personal Care Assistance Level of Assistance  Feeding, Dressing, Bathing Bathing Assistance: Independent Feeding assistance: Independent Dressing Assistance: Independent     Functional Limitations Info  Sight Sight Info: Adequate Hearing Info: Adequate      SPECIAL CARE FACTORS FREQUENCY                       Contractures      Additional Factors Info  Code Status, Allergies (Full  Code) Code Status Info:  (Full Code) Allergies Info:  (Haldol, Risperidone And Related)           Current Medications (02/20/2015):  This is the current hospital active medication list Current Facility-Administered Medications  Medication Dose Route Frequency Provider Last Rate Last Dose  . acetaminophen (TYLENOL) tablet 650 mg  650 mg Oral Q4H PRN Lavera Guiseana Duo Liu, MD      . amantadine (SYMMETREL) capsule 100 mg  100 mg Oral BID Josefine Fuhr   100 mg at 02/20/15 0927  . clonazePAM (KLONOPIN) tablet 1 mg  1 mg Oral TID Keaton Stirewalt      . ibuprofen (ADVIL,MOTRIN) tablet 600 mg  600 mg Oral Q8H PRN Lavera Guiseana Duo Liu, MD      . multivitamin with minerals tablet 1 tablet  1 tablet Oral Daily Lavera Guiseana Duo Liu, MD   1 tablet at 02/20/15 [id number]  . nicotine polacrilex (NICORETTE) gum 2 mg  2 mg Oral PRN Earney NavyJosephine C Onuoha, NP   2 mg at 02/19/15 0610  . OLANZapine zydis (ZYPREXA) disintegrating tablet 10 mg  10 mg Oral Q8H PRN Heddy Vidana   10 mg at 02/20/15 1217  . ondansetron (ZOFRAN) tablet 4 mg  4 mg Oral Q8H PRN Lavera Guiseana Duo Liu, MD      . Oxcarbazepine (TRILEPTAL) tablet 600 mg  600 mg Oral TID Kiyona Mcnall      . paliperidone (INVEGA) 24 hr tablet  9 mg  9 mg Oral Daily Rolland Steinert   9 mg at 02/20/15 4098   Current Outpatient Prescriptions  Medication Sig Dispense Refill  . clonazePAM (KLONOPIN) 1 MG tablet Take 1 tablet (1 mg total) by mouth 2 (two) times daily. 30 tablet 0  . Multiple Vitamin (MULTIVITAMIN WITH MINERALS) TABS tablet Take 1 tablet by mouth daily.    Marland Kitchen OLANZapine (ZYPREXA) 15 MG tablet Take 1 tablet (15 mg total) by mouth 2 (two) times daily after a meal. 60 tablet 0  . paliperidone (INVEGA SUSTENNA) 234 MG/1.5ML SUSP injection Inject 234 mg into the muscle once.       Discharge Medications: Please see discharge summary for a list of discharge medications.  Relevant Imaging Results:  Relevant Lab Results:   Additional Information    Claudean Severance,  LCSW     Read and agreed.  Thedore Mins, MD 02/21/2015 8:52 AM

## 2015-02-20 NOTE — ED Notes (Signed)
Patient refused vitals. Patient nurse notified. 

## 2015-02-20 NOTE — Progress Notes (Addendum)
8:30a- CSW spoke with Gregory Meza, [facility] DSS Guardian [phone] to inform her that Gregory Meza, Director from [facility] has stated she will not be able to offer patient a bed. CSW informed Guardian that Director stated CSW to seek other placements for patient.   Guardian stated she has [facility] with [facility], [address] [place], [place]. Guardian stated this facility specializes in mental health illness. Guardian stated she would call to speak with someone at this facility.   Guardian called back to state she spoke with someone at [facility] and they would want FL2 information and any other pertinent information on patient. Guardian stated Gregory Meza is a contact person to speak with at the agency. Guardian provided a contact and fax# for their agency: Gregory Meza, fax# [phone].  Guardian stated she was making attempts to speak with someone at [facility] as well.    Gregory Meza, LCSWA [phone] ED CSW 02/20/2015 9:52 AM

## 2015-02-20 NOTE — Progress Notes (Signed)
CSW received call back from [facility], [facility] [place], [address], [place], [place] [id number]. CSW informed Manager FL2 could be faxed in the morning once signed by the doctor. Fax# [phone]. Manager stated she wanted other information pertinent to patient's stay.  Elenore PaddyLaVonia Mavrik Bynum, LCSWA [phone] ED CSW 02/20/2015 3:30 PM

## 2015-02-20 NOTE — Progress Notes (Signed)
CSW attempted phone call to Aflac Inc[employer]James Barnes [phone]. This contact information was provided to CSW by Cottie Bandaontay Woolard, Guardian, [facility]. Guardian stated this was the fax and contact number for this individual. The phone never rings, phone screen goes to user busy and the call ends.   CSW spoke with Lucio EdwardKudzai Mabunda at [phone].This contact information was provided to CSW by Cottie Bandaontay Woolard, Guardian, [facility] DSS. She stated she was in the grocery store at the time and would call CSW back regarding patient.   Elenore PaddyLaVonia Ovie Eastep, LCSWA [phone] ED CSW 02/20/2015 2:49 PM

## 2015-02-21 NOTE — ED Notes (Signed)
Patient has been up most of the day.  He is pacing the halls and talking to people who are not there.  He is taking his medications and is tolerating food and fluids.  Argumentative at times, but can be re-directed.

## 2015-02-21 NOTE — ED Notes (Signed)
Patient has been up and pacing the hallways most of the day.  He has been responding to internal stimuli most of the day.  He has been loud and argumentative.  He is taking his medications as scheduled and received one extra dose of olanzapine today with minimal effect.

## 2015-02-21 NOTE — Progress Notes (Addendum)
CSW faxed FL2 to:  1. Boneta LucksJenny, [facility] 2. [name], Guardian, [place], and 3. Lucio EdwardKudzai Mabunda, [facility]. CSW received confirmation page for all three fax stating OK, meaning fax was received by intended party.   CSW left a message for Cottie BandaDontay Woolard to inform her that FL2 was faxed. CSW reiterated to Guardian in message left that pysc team continues to work on stabilization and upon this, patient could be discharged when cleared. No response from Guardian at this time.   CSW spoke with Lucio EdwardKudzai Mabunda, Director, [facility] [place], [place], [place] [phone]. Director stated received progress notes from 2nd shift CSW. Director stated she is unable to accept patient at this time due to his verbal behaviors.     Elenore PaddyLaVonia Demere Dotzler, LCSWA [phone] ED CSW 02/21/2015 12:41 PM

## 2015-02-21 NOTE — BH Assessment (Signed)
[facility] Assessment Progress Note  At 08:38 Robinette at [facility] reports that this pt remains on their wait list.  Sheray Grist, MA Triage Specialist [phone]     

## 2015-02-21 NOTE — Progress Notes (Signed)
CSW spoke with guardian, Gregory Meza. Guardian was informed again staff was working on stabilization for patient and should patient be cleared, at that point, he would be discharged.   Elenore PaddyLaVonia Garrus Gauthreaux, LCSWA [phone] ED CSW 02/21/2015 3:39 PM

## 2015-02-21 NOTE — ED Notes (Signed)
Patient calm and cooperative at this time. Encouragement and support provided and safety maintain. Q 15 min safety  Checks remain in place.

## 2015-02-21 NOTE — BH Assessment (Signed)
[facility] Assessment Progress Note  Thedore MinsMojeed Akintayo, MD, has initiated a new petition for IVC today.  Petition has been faxed to [facility] [place] Magistrate, and at 12:18 Etta QuillMagistrate Seddrick Flax acknowledged receipt.  As of this writing, service of Findings and Custody Order is pending.  Doylene Canninghomas Ashmi Blas, MA Triage Specialist [phone]

## 2015-02-21 NOTE — BH Assessment (Addendum)
Re-assessment:  Writer met with patient face to face to complete a re-assessment. Patient denies SI, HI, and AVH's today. Patient asked, "When are you guys going to discharge me" and "It's nothing wrong with me".  Patient with pressured speech states that he has been to [facility] in the past and they charged him $24,000 for a 3 month stay. Patient stating that [place] doesn't bill him enough money and for his boarding. Sts, "You guys are sitting around loosing money". Patient also expressed that he is interested in going to a facility that would allow him to smoke. Sts, "Smoking doesn't kill you ...Marland KitchenMarland KitchenIt's when you don't smoke that kills you". Patient is agitated and rants about discharging today.  Patient is intrusive and has no regard for barriers. Writer attempted to end the TTS consult as patient became increasing intrusive following this Probation officer closely to the providers office.

## 2015-02-21 NOTE — ED Notes (Signed)
IVC renewal paperwork delivered by [employer].

## 2015-02-22 MED ORDER — ASENAPINE MALEATE 5 MG SL SUBL
5.0000 mg | SUBLINGUAL_TABLET | Freq: Once | SUBLINGUAL | Status: AC | PRN
  Administered 2015-02-22: 5 mg via SUBLINGUAL
  Filled 2015-02-22: qty 1

## 2015-02-22 MED ORDER — ASENAPINE MALEATE 5 MG SL SUBL
5.0000 mg | SUBLINGUAL_TABLET | Freq: Every day | SUBLINGUAL | Status: DC
  Administered 2015-02-22: 5 mg via SUBLINGUAL
  Filled 2015-02-22 (×2): qty 1

## 2015-02-22 NOTE — Progress Notes (Addendum)
CSW spoke with DSS guardian/ [name] who inquired if patient was getting discharged. CSW informed her that the patient is not getting discharged tonight.  Also, Guardian states that [place] [facility] [facility] [place] beds [place]. However, she states that they have not stated that the will accept the patient. CSW will refer the patient to the group home.   CSW also spoke with patient community guide/ Gregory Meza. He states that the patient may be anxious due him being aware that his check is coming. Gregory Meza requested to speak with patient in order to help relax him. CSW provided Gregory Meza with the number to speak with patient. Patient agrees to speak with Mr. Gregory Meza.  At this time, the patient is [facility]. CSW staffed patient with CSW Chiropodistassistant director, due to patient not being stable there is no need to apply for a PASRR number at this time.  Gregory MageBrittney Miski Meza, LCSWA [phone] ED CSW 02/22/2015 7:12 PM

## 2015-02-22 NOTE — BH Assessment (Signed)
Patient was reassessed by TTS.   Patient was in the hallway standing by the nurses station and was asked to come into his room. Patient entered the room and asked if this writer received the information from his nurse. Patient was informed that the information from his nurse was communicated to this writer and provided to the CSW. When asked about SI/HI patient states "I plead the fifth and I want you to get out my room." This writer agreed and left the room   Dr. Jannifer FranklinAkintayo continues to recommend inpatient treatment at this time.   Davina PokeJoVea Cherl Gorney, LCSW Therapeutic Triage Specialist [facility] 02/22/2015 10:20 AM

## 2015-02-22 NOTE — ED Notes (Addendum)
Mr. Christell ConstantMoore has expressed his concerns about his finances and dwelling domain. Mr. [name] reports that his SW thinks he is getting evicted from his place of dwelling and that's why she wants to put him in a group home. Mr. 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 Patient reports "I was never suicidal or homicidal and I am not a danger to myself or others. I am ready to go home". Patient informed that writer would pass this information to next shift. Encouragement and support provided and safety maintain. Q 15 min safety checks remain place. Patient states that Victorino DecemberRon Green is his Landlord, Mrs. Clydene PughWoodard is his Child psychotherapistocial worker and she is a black lady. Mr. Christell ConstantMoore states Victorino DecemberRon Green landlord told him he was thinking about evicting him but he decided to let him stay on 02/14/15. Patient thinks the guardianship papers he has in his pocket states that he is his own guardian. Patient also wants Tammy SoursGreg at [facility] contacted and request that he be is payee. Tammy SoursGreg number is [phone]. Patient has been calm and cooperative during the shift.

## 2015-02-22 NOTE — ED Notes (Signed)
Pt reports that Gregory Meza is his landlord and he wants him to stay and watch the residence for security. He gave his stepsister the keys to his house and she was suppose to clean the house. Pt said that he was almost evicted because the window was broke from the inside with a trophy. Pt said that his nephew was living with him and now he is incarcerated. Pt states "I am not at harm or danger to anybody." Pt speaks rapidly as he is talking about his house and prophets and Moses. Pt denied hallucinations saying "God helps those that help himself," "just talking to myself." Pt observed multiples times talking to himself as he is pacing in the hall.

## 2015-02-22 NOTE — Progress Notes (Signed)
CSW spoke with Hermenia BersGreg Matthews, [place], [phone]. CSW informed Mr. Ashley RoyaltyMatthews that patient would like to speak with him and that he could come here for a visit or speak to patient by phone. Mr. Ashley RoyaltyMatthews stated patient was no longer on his case load, however, he could speak with patient by phone. Mr. Ashley RoyaltyMatthews stated otherwise, his visit would have to be a personal visit. Mr. Ashley RoyaltyMatthews asked if CSW could text a number to his phone. CSW texted the contact number for the nurses station for Mr. Ashley RoyaltyMatthews to speak with patient.   Elenore PaddyLaVonia Breaker Springer, LCSWA [phone] ED CSW 02/22/2015 1:40 PM

## 2015-02-22 NOTE — BH Assessment (Signed)
[facility] Assessment Progress Note  At 10:00 Connie at [facility] reports that this pt remains on their wait list.  Doylene Canninghomas Nyzier Boivin, MA Triage Specialist [phone]

## 2015-02-22 NOTE — ED Notes (Signed)
Patient pacing in hallway and rambling about multiple things. Patient is constantly stating "I want to go home not to no a group home. I am my own guardian.   Patient will be offered PRN medication for anxiety and agitation. Encouragement and support provided and safety maintain. Q 15 min safety checks remain in place.

## 2015-02-22 NOTE — Progress Notes (Signed)
CSW spoke with patient's guardian [name] who confirms she has received FL2.   CSW faxed [name] TB skin test result upon her request.  Trish MageBrittney Kati Riggenbach, LCSWA [phone] ED CSW 02/22/2015 12:11 AM

## 2015-02-22 NOTE — ED Notes (Signed)
Pt has became upset as different patients are being discharged. Pt is yelling, cursing and wanting to leave. Pt has increased agitation when talking to counselor regarding his IVC paperwork. Offered de-escalation and redirection. Safety maintained.

## 2015-02-22 NOTE — Progress Notes (Addendum)
CSW spoke with Zeb ComfortUrshella from the group home in [place], [place]. Ms. Zeb ComfortUrshella stated she would like information on patient for review.   Elenore PaddyLaVonia Earlean Fidalgo, LCSWA [phone] ED CSW 02/22/2015 4:20 PM

## 2015-02-23 MED ORDER — ASENAPINE MALEATE 5 MG SL SUBL
5.0000 mg | SUBLINGUAL_TABLET | Freq: Two times a day (BID) | SUBLINGUAL | Status: DC
  Administered 2015-02-23 – 2015-02-28 (×10): 5 mg via SUBLINGUAL
  Filled 2015-02-23 (×11): qty 1

## 2015-02-23 MED ORDER — LORAZEPAM 1 MG PO TABS
1.0000 mg | ORAL_TABLET | Freq: Four times a day (QID) | ORAL | Status: DC | PRN
Start: 2015-02-23 — End: 2015-02-28
  Administered 2015-02-23 – 2015-02-28 (×9): 1 mg via ORAL
  Filled 2015-02-23 (×9): qty 1

## 2015-02-23 NOTE — ED Notes (Signed)
Continues to be manic with pressured speech and disorganized thoughts. Safety checks continued.

## 2015-02-23 NOTE — ED Notes (Signed)
Pt from room 38 started arguing with patient, mistakenly thinking patient had been banging on the walls. Both patients continue arguing back and forth. Security called to the unit.

## 2015-02-23 NOTE — ED Notes (Signed)
Patient is hyperverbal,.loud and difficult to redirect.  Compliant with medications.

## 2015-02-23 NOTE — Progress Notes (Signed)
Re-assessment   Patient rambling, agitated, paranoid, and suspcious.   Maryelizabeth Rowanressa Axten Pascucci, MSW, Clare CharonLCSW, LCAS [facility] Triage Specialist [phone]

## 2015-02-23 NOTE — ED Notes (Signed)
Pt refused vital signs.

## 2015-02-24 NOTE — ED Notes (Signed)
Pt awake, alert & responsive, no distress noted, calm & cooperative at present.  Monitoring for safety, Q 15 min checks in effect. 

## 2015-02-25 NOTE — BH Assessment (Signed)
[facility] Assessment Progress Note  At 09:51 Brett CanalesSteve at [facility] reports that this pt remains on their wait list.  Doylene Canninghomas Loann Chahal, MA Triage Specialist [phone]

## 2015-02-25 NOTE — Progress Notes (Addendum)
CSW attempted telephone call with Cottie Bandaontay Woolard, Guardian, [place]. CSW left a message with name and contact number for return call.  CSW faxed FL2 to:  1. Zeb ComfortUrshella at [facility], [place], [place], fax# [phone] 2. Terrilyn Saverngie Harris fax# [phone] 3. Darryll CapersJames Barnes fax# [phone].  Elenore PaddyLaVonia Joshus Rogan, LCSWA [phone] ED CSW 02/25/2015 9:55 AM

## 2015-02-25 NOTE — ED Notes (Signed)
Pt awake, alert & responsive, no distress noted, calm & cooperative at present.  Monitoring for safety, Q 15 min checks in effect. 

## 2015-02-25 NOTE — Progress Notes (Signed)
Gregory Meza/ Gregory Meza reached out to CSW and stated that she has called multiple group homes and is searching for the patient a place to live. She states that the patient has been evicted from his apartment.   Gregory Meza provided CSW with a facility to reach out to/ Gregory Meza where Gregory Meza is Gregory Meza, Gregory Meza Gregory Meza. She states that this could be a prospective place for patient, but did not state that they have offered the patient a bed.  Gregory Meza has requested an FL2, PASRR, and TB Skin test.  CSW attempted to complete PASRR for patient, but was not successful. CSW will staff patient with 1st shit ED CSW.  CSW consulted with NP who states that a TB test for patient was completed on December 30.   CSW made Meza aware that the [facility] is not an appropriate place for patient to live, and that she should continue to search for group homes and CSW will assist with providing documentation.  Gregory Meza, LCSWA [phone] ED CSW 02/25/2015 10:30 PM

## 2015-02-25 NOTE — ED Notes (Signed)
After patient had a somewhat calm morning he has become very agitated after receiving his lunch tray.  Although he was given double portions, he was angry because they put it in one container.  He is also angry that his lemonade had ice in it.  He is standing at nurses station verbally abusing staff and making hand gestures at us.  He is hyper verbal and extremely loud, fixating on the ice in his cups.  15 minute checks and video monitoring continue.

## 2015-02-25 NOTE — Progress Notes (Addendum)
9:51a- After staffing with psyc team, CSW left a message for Gregory Meza, [place] to see if they had anywhere for patient to go at this time.    11:56a- CSW received telephone call from Mitchel [name], Supervisor, [facility] regarding [place]. Supervisor stated she would contact the guardian to obtain updated information and call CSW back with an update, as guardian was out for the day.   12:08p- CSW received telephone call from Mitchel [name], Supervisor, [facility] regarding patient. Supervisor stated they have have not heard anything from [facility]. She stated patient is openly enrolled in the [facility] that assist with community services for support. She stated patient has been placed with this program two times. She stated it does not seem as though community is going to work for patient. She stated patient has [facility] according to the Guardian, [employer] Meza. She stated [facility] was supposed to link DSS with a Child psychotherapistsocial worker for a more intensive program.  CSW informed Supervisor that some group home agencies have declined patient and some have not contacted. CSW reiterated to Supervisor [facility] [place] [facility] [place] [facility] and [facility] is [facility] the [facility].   Elenore PaddyLaVonia Tachina Spoonemore, LCSWA [phone] ED CSW 02/25/2015 12:59 PM

## 2015-02-25 NOTE — Consult Note (Signed)
[facility] Face-to[facility]Face [facility]iat[facility] Consult   Reason for Consult:  Mania  Referring Physician:  EDP Patient Identification: Gregory Meza MRN:  [id number] Principal Diagnosis: Schizoaffective disorder, bipolar type [facility](HCC) Diagnosis:   Patient Active Problem List   Diagnosis Date Noted  . Cocaine use disorder, moderate, dependence (HCC) [F14.20] 09/25/2014    Priority: High  . Schizoaffective disorder, bipolar type (HCC) [F25.0]     Priority: High  . Psychoses [F29]   . Noncompliance with medication regimen [Z91.14]   . Cannabis use disorder, moderate, dependence (HCC) [F12.20] 09/25/2014  . Substance abuse [F19.10] 03/29/2012    Class: Chronic  . Non-compliant patient [Z91.19] 03/29/2012    Class: Chronic    Total Time spent with patient: 30 minutes  Subjective:   Gregory Meza is a 50 y.o. male patient admitted with bipolar disorder.  HPI:  Patient has improved and is close to his baseline.  His intrusive behaviors have decreased greatly, mood has stabilized.  Benn's guardian has been contacted regarding his pending imminent discharge.  Past Psychiatric History: Schizoaffective disorder, cocaine abuse  Risk to Self: Suicidal Ideation:  (unable to assess ) Suicidal Intent:  (Unable to assess ) Is patient at risk for suicide?:  (Unable to assess ) Suicidal Plan?:  (Unable to assess ) Access to Means:  (Unable to assess ) What has been your use of drugs/alcohol within the last 12 months?: UDS is positive for cocaine and alcohol  How many times?:  (Unable to assess ) Other Self Harm Risks:  (Unable to assess ) Triggers for Past Attempts:  (UTA) Intentional Self Injurious Behavior:  (Unable to assess ) Risk to Others: Homicidal Ideation:  (Unable to assess ) Thoughts of Harm to Others:  (Unable to assess ) Current Homicidal Intent:  (Unable to assess ) Current Homicidal Plan:  (Unable to assess ) Access to Homicidal Means:  (Unable to assess ) Identified Victim:  (Unable to  assess ) History of harm to others?:  (Unable to assess ) Assessment of Violence: None Noted Violent Behavior Description: No violent behaviors observed. Pt is calm at this time.  Does patient have access to weapons?:  (Unable to assess) Criminal Charges Pending?:  (Unable to assess ) Does patient have a court date:  (Unable to assess ) Prior Inpatient Therapy: Prior Inpatient Therapy: Yes Prior Therapy Dates: 2014,2015, 2016 [facility]  Reason for Treatment: Schizophrenia and cocaine  Prior Outpatient Therapy: Prior Outpatient Therapy: Yes Prior Therapy Dates: Current Prior Therapy Facilty/Provider(s): [facility]  Does patient have an ACCT team?: Unknown Does patient have Intensive In-House Services?  : No Does patient have [facility] services? : Unknown Does patient have P4CC services?: Unknown  Past Medical History:  Past Medical History  Diagnosis Date  . Schizophrenia (HCC)    History reviewed. No pertinent past surgical history. Family History: History reviewed. No pertinent family history. Family Psychiatric  History: None Social History:  History  Alcohol Use  . 1.2 oz/week  . 1 Glasses of wine, 1 Cans of beer per week     History  Drug Use  . 5.00 per week  . Special: "Crack" cocaine, Cocaine    Social History   Social History  . Marital Status: Single    Spouse Name: N/A  . Number of Children: N/A  . Years of Education: N/A   Social History Main Topics  . Smoking status: Current Every Day Smoker -- 1.50 packs/day for 3 years    Types: Cigarettes  .  Smokeless tobacco: None  . Alcohol Use: 1.2 oz/week    1 Glasses of wine, 1 Cans of beer per week  . Drug Use: 5.00 per week    Special: "Crack" cocaine, Cocaine  . Sexual Activity: Not Currently   Other Topics Concern  . None   Social History Narrative   Additional Social History:    History of alcohol / drug use?:  (Unable to assess )                      Allergies:   Allergies  Allergen Reactions  . Haldol [Haloperidol]     Pt reports he is allergic to injectable and PO haldol  . Risperidone And Related     Pt reports he is allergic to injectable and PO     Labs: No results found for this or any previous visit (from the past 48 hour(s)).  Current Facility-Administered Medications  Medication Dose Route Frequency Provider Last Rate Last Dose  . acetaminophen (TYLENOL) tablet 650 mg  650 mg Oral Q4H PRN Lavera Guise, MD   650 mg at 02/22/15 2027  . amantadine (SYMMETREL) capsule 100 mg  100 mg Oral BID Jamire Shabazz   100 mg at 02/25/15 1040  . asenapine (SAPHRIS) sublingual tablet 5 mg  5 mg Sublingual BID Kayson Bullis   5 mg at 02/25/15 1042  . clonazePAM (KLONOPIN) tablet 1 mg  1 mg Oral TID Keaundre Thelin   1 mg at 02/25/15 1039  . ibuprofen (ADVIL,MOTRIN) tablet 600 mg  600 mg Oral Q8H PRN Lavera Guise, MD      . LORazepam (ATIVAN) tablet 1 mg  1 mg Oral Q6H PRN Charm Rings, NP   1 mg at 02/24/15 2128  . multivitamin with minerals tablet 1 tablet  1 tablet Oral Daily Lavera Guise, MD   1 tablet at 02/25/15 1039  . nicotine polacrilex (NICORETTE) gum 2 mg  2 mg Oral PRN Earney Navy, NP   2 mg at 02/22/15 1422  . ondansetron (ZOFRAN) tablet 4 mg  4 mg Oral Q8H PRN Lavera Guise, MD      . Oxcarbazepine (TRILEPTAL) tablet 600 mg  600 mg Oral TID Kierstin January   600 mg at 02/25/15 1040  . paliperidone (INVEGA) 24 hr tablet 9 mg  9 mg Oral Daily Jassmin Kemmerer   9 mg at 02/25/15 1039   Current Outpatient Prescriptions  Medication Sig Dispense Refill  . clonazePAM (KLONOPIN) 1 MG tablet Take 1 tablet (1 mg total) by mouth 2 (two) times daily. 30 tablet 0  . Multiple Vitamin (MULTIVITAMIN WITH MINERALS) TABS tablet Take 1 tablet by mouth daily.    Marland Kitchen OLANZapine (ZYPREXA) 15 MG tablet Take 1 tablet (15 mg total) by mouth 2 (two) times daily after a meal. 60 tablet 0  . paliperidone (INVEGA SUSTENNA) 234 MG/1.5ML SUSP  injection Inject 234 mg into the muscle once.      Musculoskeletal: Strength & Muscle Tone: within normal limits Gait & Station: normal Patient leans: N/A  Psychiatric Specialty Exam: Review of Systems  Constitutional: Negative.   HENT: Negative.   Eyes: Negative.   Respiratory: Negative.   Cardiovascular: Negative.   Gastrointestinal: Negative.   Genitourinary: Negative.   Musculoskeletal: Negative.   Skin: Negative.   Neurological: Negative.   Endo/Heme/Allergies: Negative.   Psychiatric/Behavioral: The patient is nervous/anxious.        Delusions, hypomania    Blood pressure 130/79,  pulse 98, temperature 97.8 F (36.6 C), temperature source Oral, resp. rate 18, SpO2 99 %.There is no weight on file to calculate BMI.  General Appearance: Casual  Eye Contact::  Good  Speech:  Normal  Volume:  Normal  Mood:  Euthymic  Affect:  Blunt  Thought Process:  Tangential  Orientation:  Full (Time, Place, and Person)  Thought Content:  Delusions at times  Suicidal Thoughts:  No  Homicidal Thoughts:  No  Memory:  Immediate;   Fair Recent;   Fair Remote;   Fair  Judgement:  Fair  Insight:  Fair  Psychomotor Activity:  Increased  Concentration:  Fair  Recall:  [employer] of Knowledge:Fair  Language: Fair  Akathisia:  No  Handed:  Right  AIMS (if indicated):     Assets:  Financial Resources/Insurance Leisure Time Physical Health Resilience Social Support  ADL's:  Intact  Cognition: Impaired, mild  Sleep:      Treatment Plan Summary: Daily contact with patient to assess and evaluate symptoms and progress in treatment, Medication management and Plan schizoaffective disorder, bipolar type, severe:  -Crisis stabilization -Medication management:  Zyprexa 10 mg once for agitation along with Benadryl 50 mg po and Ativan 2 mg po for agitation given.  Continue Invega 9 mg for psychosis, Seroquel 100 mg at bedtime for sleep and psychosis, Klonopin 1 mg BID for anxiety, Amantadine  100 mg BID to prevent EPS, and Trileptal 600 mg BID for mood stabilization was increased to TID and Saphris 5 mg BID was started for psychosis and mood stabilization -Individual counseling -Guardian communication  Disposition: Recommend psychiatric Inpatient admission when medically cleared.  Nanine Means, PMH-NP 02/25/2015 1:20 PM Patient seen face-to-face for psychiatric evaluation, chart reviewed and case discussed with the physician extender and developed treatment plan. Reviewed the information documented and agree with the treatment plan. Thedore Mins, MD

## 2015-02-25 NOTE — Progress Notes (Signed)
CSW faxed patient to 2 facilities per guardians request to assist with placement. The following facilities are listed below with fax numbers :  1.[facility] [phone] 2. [address] [phone]  Trish MageBrittney Djuna Frechette, LCSWA [phone] ED CSW 02/25/2015 10:39 PM

## 2015-02-26 MED ORDER — NICOTINE POLACRILEX 2 MG MT GUM
2.0000 mg | CHEWING_GUM | OROMUCOSAL | Status: DC
  Administered 2015-02-26 – 2015-02-27 (×6): 2 mg via ORAL
  Filled 2015-02-26 (×7): qty 1

## 2015-02-26 NOTE — ED Notes (Signed)
Pt is being very difficult this am.  He refuses to shut the bathroom door while he urinates.  When he was asked to shut door he made inappropriate comments about sex to staff.  He  has been spitting on floor today as well.

## 2015-02-26 NOTE — BH Assessment (Signed)
Dr. Jannifer FranklinAkintayo and Julieanne CottonJosephine, NP continue to recommend inpatient treatment. TTS to seek placement at outside facilities including [facility]. [facility] Robinette @ [facility] confirms that pt is on wait list; auth #[id number]. New IVC submitted on 02/21/15.

## 2015-02-26 NOTE — Progress Notes (Signed)
Patient's case has been clearly reviewed and discussed with the psychiatry team.  Elenore PaddyLaVonia Bennette Hasty, LCSWA [phone] ED CSW 02/26/2015 3:59 PM

## 2015-02-26 NOTE — Progress Notes (Addendum)
ED CM discussed Quality collaborative recommendations with SAPPU team- MD, NP/PA, TTS, SW  [facility] placement

## 2015-02-27 NOTE — ED Notes (Signed)
Patient calm and cooperative at this. Encouragement and support provided and safety checks remain in place.

## 2015-02-27 NOTE — ED Notes (Signed)
Pt presents up at nurses station, disorganized, pressured, loud, Speech, disorganized thought content, delusional, but pleasant. Difficult to redirect, but pleasant. Pt denies SI/HI. Pacing hall. Compliant with medication regimen. Will continue to monitor on special checks q 15 mins for safety

## 2015-02-27 NOTE — BHH Counselor (Signed)
Contacted [facility] at 1349 on 02/27/15, per Robinette @ [facility] pt. Remains on wait list. Aniyla Harling K. Jesus GeneraHarris,  LPC-A, [facility]  Counselor 02/27/2015 1:55 PM

## 2015-02-27 NOTE — BHH Counselor (Signed)
02/27/2015   Dr. Jannifer FranklinAkintayo and Julieanne CottonJosephine, NP continue to recommend inpatient treatment. TTS to seek placement at outside facilities including [facility]. [facility] Robinette  @ [facility] confirms that pt is on wait list; auth #[id number]. New IVC submitted on 02/21/15.

## 2015-02-27 NOTE — Consult Note (Signed)
Psychiatric Specialty Exam: Physical Exam  ROS  Blood pressure 121/75, pulse 95, temperature 97.5 F (36.4 C), temperature source Oral, resp. rate 18, SpO2 100 %.There is no weight on file to calculate BMI.  General Appearance: Casual  Eye Contact:: Good  Speech: Normal  Volume: Normal  Mood: Euthymic  Affect: Blunt  Thought Process: Tangential  Orientation: Full (Time, Place, and Person)  Thought Content: Delusions at times  Suicidal Thoughts: No  Homicidal Thoughts: No  Memory: Immediate; Fair Recent; Fair Remote; Fair  Judgement: Fair  Insight: Fair  Psychomotor Activity: Increased  Concentration: Fair  Recall: [employer]  Fund of Knowledge:Fair  Language: Fair  Akathisia: No  Handed: Right  AIMS (if indicated):    Assets: Financial Resources/Insurance Leisure Time Physical Health Resilience Social Support  ADL's: Intact  Cognition: Impaired, mild           Patient need redirecting frequently.  He is compliant with medications.  He is intrusive at times stopping staff members and providers to speak about his disposition home.  Patient is still having early morning awakenings.   Patient is not taking care of his ADLS.  He denies SI/HI/[facility].  Schizoaffective disorder, bipolar type (HCC)   Plan:  Continue offering medications, wait for [facility] placement.  Dahlia ByesJosephine Onuoha   PMHNP-BC Patient seen face-to-face for psychiatric evaluation, chart reviewed and case discussed with the physician extender and developed treatment plan. Reviewed the information documented and agree with the treatment plan. Thedore MinsMojeed Bayle Calvo, MD

## 2015-02-28 MED ORDER — NICOTINE POLACRILEX 2 MG MT GUM: 2.0000 mg | CHEWING_GUM | OROMUCOSAL | Status: DC | PRN

## 2015-02-28 NOTE — Consult Note (Signed)
Patient [facility] FOR  [facility] when [facility]  is final[facility].  Patient was prepared by provider pending when patient will be transferred this evening.  [facility]  Before final [facility] was given.  [facility] Dahlia ByesJosephine Onuoha   PMHNP-BC Patient seen face-to-face for psychiatric evaluation, chart reviewed and case discussed with the physician extender and developed treatment plan. Reviewed the information documented and agree with the treatment plan. Thedore MinsMojeed Hezakiah Champeau, MD

## 2015-02-28 NOTE — BH Assessment (Signed)
Received call from Junious Dresseronnie, RN (charge nurse) stating that patient was accepted contingent upon (MAR, Vitals, new IVC). Patient's IVC was re-initiated and signed by Dr. Jannifer FranklinAkintayo and patient was waiting to be served. Once patient was served the IVC by [employer]. Writer faxed all [facility] for [facility] and official acceptance to the facility. Writer contacted the facility after a confirmation was received to insure that all paperwork was received. Writer told that all the paperwork was not received; [facility] and [facility]. Writer re-faxed all documentation again.   Writer later learned that patient was already in transport to the facility. Unfortunately patient had not been given a formal acceptance nor was the information faxed verified. Writer asked the nurse working with patient to make [facility] aware that patient was already in route. Nursing staff did agree to [facility] that patient was mistakenly sent to the facility. Writer also contacted [facility] to make aware. [facility] was also made aware of the situation.

## 2015-02-28 NOTE — ED Notes (Signed)
[facility]  [facility]  All belongings given to the driver.

## 2015-02-28 NOTE — ED Notes (Signed)
Patient calm and cooperative at this time. Encouragement and support provided and safety maintain. Q 15 min safety checks remain in place.

## 2015-02-28 NOTE — ED Notes (Signed)
Patient refused vitals.  Nurse notified.

## 2015-02-28 NOTE — Progress Notes (Signed)
CSW received telephone call from [place], [facility] regarding patient's status. CSW spoke with the Psychiatry Team to obtain information and informed Boneta LucksJenny that patient is delusional, psychotic, grandiose, paces hallway, has no boundaries, and that he is not taking care of her ADL's.    Elenore PaddyLaVonia Aylin Rhoads, LCSWA [phone] ED CSW 02/28/2015 12:04 PM

## 2018-05-28 ENCOUNTER — Other Ambulatory Visit: Payer: Self-pay

## 2018-05-28 ENCOUNTER — Emergency Department
Admission: EM | Admit: 2018-05-28 | Discharge: 2018-05-28 | Disposition: A | Discharge status: Home / Self Care | Payer: Medicaid Other | Attending: Emergency Medicine | Admitting: Emergency Medicine

## 2018-05-28 ENCOUNTER — Encounter: Payer: Self-pay | Admitting: Emergency Medicine

## 2018-05-28 DIAGNOSIS — Z79899 Other long term (current) drug therapy: Secondary | ICD-10-CM | POA: Diagnosis not present

## 2018-05-28 DIAGNOSIS — F22 Delusional disorders: Secondary | ICD-10-CM | POA: Diagnosis present

## 2018-05-28 DIAGNOSIS — F250 Schizoaffective disorder, bipolar type: Secondary | ICD-10-CM | POA: Insufficient documentation

## 2018-05-28 DIAGNOSIS — F17210 Nicotine dependence, cigarettes, uncomplicated: Secondary | ICD-10-CM | POA: Insufficient documentation

## 2018-05-28 LAB — URINE DRUG SCREEN, QUALITATIVE (ARMC ONLY)
Amphetamines, Ur Screen: NOT DETECTED
Barbiturates, Ur Screen: NOT DETECTED
Benzodiazepine, Ur Scrn: NOT DETECTED
Cannabinoid 50 Ng, Ur ~~LOC~~: NOT DETECTED
Cocaine Metabolite,Ur ~~LOC~~: NOT DETECTED
MDMA (Ecstasy)Ur Screen: NOT DETECTED
Methadone Scn, Ur: NOT DETECTED
Opiate, Ur Screen: NOT DETECTED
Phencyclidine (PCP) Ur S: NOT DETECTED
Tricyclic, Ur Screen: NOT DETECTED

## 2018-05-28 LAB — COMPREHENSIVE METABOLIC PANEL
ALT: 19 U/L (ref 0–44)
AST: 19 U/L (ref 15–41)
Albumin: 4.5 g/dL (ref 3.5–5.0)
Alkaline Phosphatase: 84 U/L (ref 38–126)
Anion gap: 10 (ref 5–15)
BUN: 8 mg/dL (ref 6–20)
CO2: 25 mmol/L (ref 22–32)
Calcium: 9 mg/dL (ref 8.9–10.3)
Chloride: 102 mmol/L (ref 98–111)
Creatinine, Ser: 0.66 mg/dL (ref 0.61–1.24)
GFR calc Af Amer: >60 mL/min (ref >60)
GFR calc non Af Amer: >60 mL/min (ref >60)
Glucose, Bld: 110 mg/dL — ABNORMAL HIGH (ref 70–99)
Potassium: 3.8 mmol/L (ref 3.5–5.1)
Sodium: 137 mmol/L (ref 135–145)
Total Bilirubin: 0.6 mg/dL (ref 0.3–1.2)
Total Protein: 7.7 g/dL (ref 6.5–8.1)

## 2018-05-28 LAB — CBC
HCT: 47.3 % (ref 39.0–52.0)
Hemoglobin: 15.8 g/dL (ref 13.0–17.0)
MCH: 31.4 pg (ref 26.0–34.0)
MCHC: 33.4 g/dL (ref 30.0–36.0)
MCV: 94 fL (ref 80.0–100.0)
Platelets: 185 10*3/uL (ref 150–400)
RBC: 5.03 MIL/uL (ref 4.22–5.81)
RDW: 14.4 % (ref 11.5–15.5)
WBC: 11.5 10*3/uL — ABNORMAL HIGH (ref 4.0–10.5)
nRBC: 0 % (ref 0.0–0.2)

## 2018-05-28 LAB — LITHIUM LEVEL: Lithium Lvl: 0.84 mmol/L (ref 0.60–1.20)

## 2018-05-28 LAB — ETHANOL: Alcohol, Ethyl (B): <10 mg/dL (ref <10)

## 2018-05-28 NOTE — Discharge Instructions (Addendum)
Continue to take your medications as prescribed.  Follow-up with your regular medical doctor and psychiatrist.  Return to the ER for any new or worsening symptoms that concern you.

## 2018-05-28 NOTE — ED Triage Notes (Signed)
EMS pt to 20 h from his group home. Pt reports he saw his legal guardian knocking on the door at his group home but knows she was not there. Denies SI/HI

## 2018-05-28 NOTE — ED Provider Notes (Signed)
[facility] Emergency Department Provider Note ____________________________________________   First MD Initiated Contact with Patient 05/28/18 0139     (approximate)  I have reviewed the triage vital signs and the nursing notes.   HISTORY  Chief Complaint Mental Health Problem    HPI Gregory Meza is a 53 y.o. male with PMH as noted below who presents stating he has been feeling "delusional" and saw someone knocking on the door at his group home that he knows was not there.  The patient states that he is also unhappy with his group home because the other residents are too old.  He states that "Dr. Salena Saner" told him to come to the hospital and that he wants to be admitted to [facility] in [place] to "start things over" and possibly be placed in a different group home or assisted living.  He denies any SI or HI.  He denies any acute medical complaints.  Past Medical History:  Diagnosis Date  . Schizophrenia [facility](HCC)     Patient Active Problem List   Diagnosis Date Noted  . Psychoses (HCC)   . Noncompliance with medication regimen   . Cocaine use disorder, moderate, dependence (HCC) 09/25/2014  . Cannabis use disorder, moderate, dependence (HCC) 09/25/2014  . Schizoaffective disorder, bipolar type (HCC)   . Substance abuse (HCC) 03/29/2012    Class: Chronic  . Non-compliant patient 03/29/2012    Class: Chronic    No past surgical history on file.  Prior to Admission medications   Medication Sig Start Date End Date Taking? Authorizing Provider  clonazePAM (KLONOPIN) 1 MG tablet Take 1 tablet (1 mg total) by mouth 2 (two) times daily. 02/08/15   Charm RingsLord, Jamison Y, NP  Multiple Vitamin (MULTIVITAMIN WITH MINERALS) TABS tablet Take 1 tablet by mouth daily. 09/25/14   Thermon Leylandavis, Laura A, NP  OLANZapine (ZYPREXA) 15 MG tablet Take 1 tablet (15 mg total) by mouth 2 (two) times daily after a meal. 02/08/15   Charm RingsLord, Jamison Y, NP  paliperidone (INVEGA SUSTENNA) 234  MG/1.5ML SUSP injection Inject 234 mg into the muscle once.    [provider]    Allergies Haldol [haloperidol] and Risperidone and related  No family history on file.  Social History Social History   Tobacco Use  . Smoking status: Current Every Day Smoker    Packs/day: 1.50    Years: 3.00    Pack years: 4.50    Types: Cigarettes  Substance Use Topics  . Alcohol use: Yes    Alcohol/week: 2.0 standard drinks    Types: 1 Glasses of wine, 1 Cans of beer per week  . Drug use: Yes    Frequency: 5.0 times per week    Types: "Crack" cocaine, Cocaine    Review of Systems  Constitutional: No fever. Eyes: No redness. ENT: No sore throat. Cardiovascular: Denies chest pain. Respiratory: Denies shortness of breath. Gastrointestinal: No vomiting or diarrhea.  Genitourinary: Negative for dysuria.  Musculoskeletal: Negative for back pain. Skin: Negative for rash. Neurological: Negative for headache.   ____________________________________________   PHYSICAL EXAM:  VITAL SIGNS: ED Triage Vitals [05/28/18 0142]  Enc Vitals Group     BP      Pulse      Resp      Temp      Temp src      SpO2      Weight 250 lb (113.4 kg)     Height 6' (1.829 m)     Head  Circumference      Peak Flow      Pain Score 0     Pain Loc      Pain Edu?      Excl. in GC?     Constitutional: Alert and oriented. Well appearing and in no acute distress. Eyes: Conjunctivae are normal.  Head: Atraumatic. Nose: No congestion/rhinnorhea. Mouth/Throat: Mucous membranes are moist.   Neck: Normal range of motion.  Cardiovascular: Good peripheral circulation. Respiratory: Normal respiratory effort.  Gastrointestinal: No distention.  Musculoskeletal: No lower extremity edema.  Extremities warm and well perfused.  Neurologic:  Normal speech and language. No gross focal neurologic deficits are appreciated.  Skin:  Skin is warm and dry. No rash noted. Psychiatric: Calm and cooperative.   ____________________________________________   LABS (all labs ordered are listed, but only abnormal results are displayed)  Labs Reviewed  COMPREHENSIVE METABOLIC PANEL - Abnormal; Notable for the following components:      Result Value   Glucose, Bld 110 (*)    All other components within normal limits  CBC - Abnormal; Notable for the following components:   WBC 11.5 (*)    All other components within normal limits  ETHANOL  URINE DRUG SCREEN, QUALITATIVE ([facility] ONLY)  LITHIUM LEVEL   ____________________________________________  EKG   ____________________________________________  RADIOLOGY    ____________________________________________   PROCEDURES  Procedure(s) performed: No  Procedures  Critical Care performed: No ____________________________________________   INITIAL IMPRESSION / ASSESSMENT AND PLAN / ED COURSE  Pertinent labs & imaging results that were available during my care of the patient were reviewed by me and considered in my medical decision making (see chart for details).  53 year old male with PMH as noted above presents reporting "delusions" although what he is actually reporting or hallucinations of someone knocking on his door.  He also complains that he is unhappy at his group home and states that a doctor told him he should come to the ED to be evaluated and wants to be admitted to [facility] to "start over" and be placed somewhere else.  He denies SI or HI and has no acute medical complaints.  On exam, the patient is calm and cooperative.  His vital signs are normal.  The exam is unremarkable.  Based on the patient's behavior and description of his symptoms, I have a low suspicion for acute psychosis and I suspect that the patient is primarily looking to be placed elsewhere.  There is no evidence of danger to self or others at this time and no indication for IVC.  I will obtain a tele-psychiatry consultation and TTS evaluation as well as  basic labs for medical clearance.  ----------------------------------------- 6:16 AM on 05/28/2018 -----------------------------------------  Lab work-up is unremarkable.  The patient has been evaluated by psychiatry.  There is no indication for psychiatric admission, and he is stable for discharge back to his facility.  RN will contact the group home.  Return precautions given and the patient expresses understanding. ____________________________________________   FINAL CLINICAL IMPRESSION(S) / ED DIAGNOSES  Final diagnoses:  Schizoaffective disorder, bipolar type (HCC)      NEW MEDICATIONS STARTED DURING THIS VISIT:  New Prescriptions   No medications on file     Note:  This document was prepared using Dragon voice recognition software and may include unintentional dictation errors.    Dionne Bucy, MD 05/28/18 [phone]

## 2018-05-28 NOTE — ED Notes (Signed)
Report to Dr Hermelinda Medicus att

## 2018-05-28 NOTE — ED Notes (Signed)
This RN spoke with Terri Skains owner of patients group home and he will have someone pick him up at 8am.

## 2018-05-28 NOTE — ED Notes (Signed)
Spoke to Geiger, [phone], caregiver at group home, reports MAR and that pt "is good about taking medication"  Reports meds: - Claritin daily  - Lithium 1200 mg bedtime - Invega 9 mg bedtime - trazodone 100 mg PRN for sleep

## 2018-05-28 NOTE — BH Assessment (Signed)
Assessment Note  Gregory Meza is an 53 y.o. male who presents to he ED voluntarily after he claims that he saw his guardian banging on the door of the group home. [facility]/c [facility]. Pt denies SI/HI A/H   Diagnosis: Schizophrenia  Past Medical History:  Past Medical History:  Diagnosis Date  . Schizophrenia (HCC)     No past surgical history on file.  Family History: No family history on file.  Social History:  reports that he has been smoking cigarettes. He has a 4.50 pack-year smoking history. He does not have any smokeless tobacco history on file. He reports current alcohol use of about 2.0 standard drinks of alcohol per week. He reports current drug use. Frequency: 5.00 times per week. Drugs: "Crack" cocaine and Cocaine.  Additional Social History:  Alcohol / Drug Use Pain Medications: SEE MAR Prescriptions: SEE MAR Over the Counter: SEE MAR History of alcohol / drug use?: Yes Substance #1 Name of Substance 1: hx of crack cocaine  CIWA: CIWA-Ar BP: (!) 139/56 Pulse Rate: 77 COWS:    Allergies:  Allergies  Allergen Reactions  . Haldol [Haloperidol]     Pt reports he is allergic to injectable and PO haldol  . Risperidone And Related     Pt reports he is allergic to injectable and PO     Home Medications: (Not in a hospital admission)   OB/GYN Status:  No LMP for male patient.  General Assessment Data Location of Assessment[facility] ED TTS Assessment: In system Is this a Tele or Face-to-Face Assessment?: Tele Assessment Is this an Initial Assessment or a Re-assessment for this encounter?: Initial Assessment Patient Accompanied by:: N/A Language Other than English: No Living Arrangements: In Group Home: (Comment: Name of Group Home) What gender do you identify as?: Male Marital status: Single Pregnancy Status: No Living Arrangements: Group Home Can pt return to current living arrangement?: No Admission Status:  Voluntary Petitioner: Other Is patient capable of signing voluntary admission?: Yes Referral Source: Self/Family/Friend Insurance type: Medicaid  Medical Screening Exam [facility] ONLY) Medical Exam completed: Yes  Crisis Care Plan Living Arrangements: Group Home Legal Guardian: Other:(Danta Woolard) Name of Psychiatrist: n/a Name of Therapist: n/a  Education Status Is patient currently in school?: No Is the patient employed, unemployed or receiving disability?: Receiving disability income  Risk to self with the past 6 months Suicidal Ideation: No Has patient been a risk to self within the past 6 months prior to admission? : No Suicidal Intent: No Has patient had any suicidal intent within the past 6 months prior to admission? : No Is patient at risk for suicide?: No Suicidal Plan?: No-Not Currently/Within Last 6 Months Has patient had any suicidal plan within the past 6 months prior to admission? : No Access to Means: No What has been your use of drugs/alcohol within the last 12 months?: hx of druguse Previous Attempts/Gestures: No How many times?: 0 Other Self Harm Risks: n/a Triggers for Past Attempts: None known Intentional Self Injurious Behavior: None Family Suicide History: No Recent stressful life event(s): Other (Comment) Persecutory voices/beliefs?: No Depression: Yes Substance abuse history and/or treatment for substance abuse?: Yes Suicide prevention information given to non-admitted patients: Not applicable  Risk to Others within the past 6 months Homicidal Ideation: No Does patient have any lifetime risk of violence toward others beyond the six months prior to admission? : No Thoughts of Harm to Others: No Current  Homicidal Intent: No Current Homicidal Plan: No Access to Homicidal Means: No Identified Victim: none History of harm to others?: No Assessment of Violence: None Noted Violent Behavior Description: denies Does patient have access to  weapons?: No Criminal Charges Pending?: No Does patient have a court date: No Is patient on probation?: No  Psychosis Hallucinations: Visual Delusions: Persecutory  Mental Status Report Appearance/Hygiene: Unremarkable Eye Contact: Good Motor Activity: Freedom of movement Speech: Logical/coherent Level of Consciousness: Alert Mood: Pleasant Anxiety Level: None Thought Processes: Thought Blocking Judgement: Impaired Orientation: Person, Place, Situation, Time, Appropriate for developmental age Obsessive Compulsive Thoughts/Behaviors: None  Cognitive Functioning Concentration: Normal Memory: Unable to Assess Is patient IDD: Yes Level of Function: unknown Is IQ score available?: No Insight: Fair Impulse Control: Good Appetite: Good Have you had any weight changes? : No Change Sleep: Increased Total Hours of Sleep: 16 Vegetative Symptoms: Staying in bed  ADLScreening [facility] Assessment Services) Patient's cognitive ability adequate to safely complete daily activities?: Yes Patient able to express need for assistance with ADLs?: Yes Independently performs ADLs?: Yes (appropriate for developmental age)  Prior Inpatient Therapy Prior Inpatient Therapy: Yes Prior Therapy Dates: Several Prior Therapy Facilty/Provider(s): [facility]  Prior Outpatient Therapy Prior Outpatient Therapy: Yes Prior Therapy Dates: Current [facility] Reason for Treatment: Schizophrenia Does patient have an ACCT team?: No Does patient have Intensive In-House Services?  : No Does patient have [facility] services? : Yes Does patient have P4CC services?: No  ADL Screening (condition at time of admission) Patient's cognitive ability adequate to safely complete daily activities?: Yes Is the patient deaf or have difficulty hearing?: No Does the patient have difficulty seeing, even when wearing glasses/contacts?: No Does the patient have difficulty concentrating,  remembering, or making decisions?: No Patient able to express need for assistance with ADLs?: Yes Does the patient have difficulty dressing or bathing?: No Independently performs ADLs?: Yes (appropriate for developmental age) Does the patient have difficulty walking or climbing stairs?: No Weakness of Legs: None Weakness of Arms/Hands: None  Home Assistive Devices/Equipment Home Assistive Devices/Equipment: None  Therapy Consults (therapy consults require a physician order) PT Evaluation Needed: No OT Evalulation Needed: No SLP Evaluation Needed: No Abuse/Neglect Assessment (Assessment to be complete while patient is alone) Abuse/Neglect Assessment Can Be Completed: Yes Physical Abuse: Denies Verbal Abuse: Denies Sexual Abuse: Denies Exploitation of patient/patient's resources: Denies Self-Neglect: Denies Values / Beliefs Cultural Requests During Hospitalization: None Spiritual Requests During Hospitalization: None Consults Spiritual Care Consult Needed: No Social Work Consult Needed: No            Disposition:  Disposition Initial Assessment Completed for this Encounter: Yes Disposition of Patient: Discharge Patient refused recommended treatment: No Mode of transportation if patient is discharged/movement?: Car  On Site Evaluation by:   Reviewed with Physician:    Jeannie Mallinger D Desmund Elman 05/28/2018 4:19 AM

## 2018-05-28 NOTE — ED Notes (Signed)
This RN spoke with Arnetha Courser from [facility] to inform her that pt was in the ED.

## 2018-07-02 ENCOUNTER — Encounter: Payer: Self-pay | Admitting: Emergency Medicine

## 2018-07-02 ENCOUNTER — Other Ambulatory Visit: Payer: Self-pay

## 2018-07-02 ENCOUNTER — Emergency Department
Admission: EM | Admit: 2018-07-02 | Discharge: 2018-07-03 | Disposition: A | Discharge status: Home / Self Care | Payer: Medicaid Other | Attending: Emergency Medicine | Admitting: Emergency Medicine

## 2018-07-02 DIAGNOSIS — F259 Schizoaffective disorder, unspecified: Secondary | ICD-10-CM | POA: Diagnosis not present

## 2018-07-02 DIAGNOSIS — Z79899 Other long term (current) drug therapy: Secondary | ICD-10-CM | POA: Insufficient documentation

## 2018-07-02 DIAGNOSIS — F200 Paranoid schizophrenia: Secondary | ICD-10-CM

## 2018-07-02 DIAGNOSIS — F23 Brief psychotic disorder: Secondary | ICD-10-CM | POA: Diagnosis present

## 2018-07-02 DIAGNOSIS — F17210 Nicotine dependence, cigarettes, uncomplicated: Secondary | ICD-10-CM | POA: Insufficient documentation

## 2018-07-02 NOTE — ED Triage Notes (Signed)
EMS pt to rm 19 h from [facility]. Pt does not wish to be at his group home any longer as he thinks they have been poisoning him.

## 2018-07-03 NOTE — ED Notes (Signed)
Pt returned to his bed. Remains calm and cooperative.

## 2018-07-03 NOTE — ED Notes (Signed)

## 2018-07-03 NOTE — ED Notes (Signed)
He is sitting up on the side of the bed  - awake and alert  NAD assessed  Per report he is discharged and we are awaiting the group home to pick him up

## 2018-07-03 NOTE — ED Notes (Signed)
Pt taken to consult room for [facility] consult.

## 2018-07-03 NOTE — ED Provider Notes (Signed)
[facility] Emergency Department Provider Note   First MD Initiated Contact with Patient 07/02/18 2345     (approximate)  I have reviewed the triage vital signs and the nursing notes.   HISTORY  Chief Complaint Paranoid    HPI Gregory Meza is a 53 y.o. male history of paranoid schizophrenia presents to the emergency department from the group home where he resides secondary to paranoid delusions.  Patient states that while attempting to go to sleep he felt like "I felt like I was going to die".  Group home states that the patient did not want to come in sign and a such a verbal altercation ensued prompting them to send the patient's to the emergency department.  Patient denies any suicidal or homicidal ideation.  Patient denies any auditory visual hallucinations.         Past Medical History:  Diagnosis Date  . Schizophrenia [facility](HCC)     Patient Active Problem List   Diagnosis Date Noted  . Psychoses (HCC)   . Noncompliance with medication regimen   . Cocaine use disorder, moderate, dependence (HCC) 09/25/2014  . Cannabis use disorder, moderate, dependence (HCC) 09/25/2014  . Schizoaffective disorder, bipolar type (HCC)   . Substance abuse (HCC) 03/29/2012    Class: Chronic  . Non-compliant patient 03/29/2012    Class: Chronic    History reviewed. No pertinent surgical history.  Prior to Admission medications   Medication Sig Start Date End Date Taking? Authorizing Provider  clonazePAM (KLONOPIN) 1 MG tablet Take 1 tablet (1 mg total) by mouth 2 (two) times daily. 02/08/15   Charm RingsLord, Jamison Y, NP  Multiple Vitamin (MULTIVITAMIN WITH MINERALS) TABS tablet Take 1 tablet by mouth daily. 09/25/14   Thermon Leylandavis, Laura A, NP  OLANZapine (ZYPREXA) 15 MG tablet Take 1 tablet (15 mg total) by mouth 2 (two) times daily after a meal. 02/08/15   Charm RingsLord, Jamison Y, NP  paliperidone (INVEGA SUSTENNA) 234 MG/1.5ML SUSP injection Inject 234 mg into the muscle once.     [provider]    Allergies Haldol [haloperidol] and Risperidone and related  History reviewed. No pertinent family history.  Social History Social History   Tobacco Use  . Smoking status: Current Every Day Smoker    Packs/day: 1.50    Years: 3.00    Pack years: 4.50    Types: Cigarettes  Substance Use Topics  . Alcohol use: Yes    Alcohol/week: 2.0 standard drinks    Types: 1 Glasses of wine, 1 Cans of beer per week  . Drug use: Yes    Frequency: 5.0 times per week    Types: "Crack" cocaine, Cocaine    Review of Systems Constitutional: No fever/chills Eyes: No visual changes. ENT: No sore throat. Cardiovascular: Denies chest pain. Respiratory: Denies shortness of breath. Gastrointestinal: No abdominal pain.  No nausea, no vomiting.  No diarrhea.  No constipation. Genitourinary: Negative for dysuria. Musculoskeletal: Negative for neck pain.  Negative for back pain. Integumentary: Negative for rash. Neurological: Negative for headaches, focal weakness or numbness. Psychiatric:  Positive for paranoid delusions   ____________________________________________   PHYSICAL EXAM:  VITAL SIGNS: ED Triage Vitals  Enc Vitals Group     BP 07/02/18 2328 136/81     Pulse Rate 07/02/18 2328 90     Resp 07/02/18 2328 18     Temp 07/02/18 2328 98.2 F (36.8 C)     Temp Source 07/02/18 2328 Oral     SpO2 07/02/18 2328  98 %     Weight 07/02/18 2339 116.6 kg (257 lb)     Height 07/02/18 2339 1.854 m (6\' 1" )     Head Circumference --      Peak Flow --      Pain Score 07/02/18 2326 0     Pain Loc --      Pain Edu? --      Excl. in GC? --     Constitutional: Alert and oriented. Well appearing and in no acute distress. Eyes: Conjunctivae are normal.  Head: Atraumatic. Mouth/Throat: Mucous membranes are moist.  Oropharynx non-erythematous. Neck: No stridor. Cardiovascular: Normal rate, regular rhythm. Good peripheral circulation. Grossly normal heart sounds.  Respiratory: Normal respiratory effort.  No retractions. No audible wheezing. Gastrointestinal: Soft and nontender. No distention.  Musculoskeletal: No lower extremity tenderness nor edema. No gross deformities of extremities. Neurologic:  Normal speech and language. No gross focal neurologic deficits are appreciated.  Skin:  Skin is warm, dry and intact. No rash noted. Psychiatric: Mood and affect are normal. Speech and behavior are normal.  ___________   Procedures   ____________________________________________   INITIAL IMPRESSION / MDM / ASSESSMENT AND PLAN / ED COURSE  As part of my medical decision making, I reviewed the following data within the electronic MEDICAL RECORD NUMBER   *Gregory Meza was evaluated in Emergency Department on 07/03/2018 for the symptoms described in the history of present illness. He was evaluated in the context of the global COVID-19 pandemic, which necessitated consideration that the patient might be at risk for infection with the SARS-CoV-2 virus that causes COVID-19. Institutional protocols and algorithms that pertain to the evaluation of patients at risk for COVID-19 are in a state of rapid change based on information released by regulatory bodies including the CDC and federal and state organizations. These policies and algorithms were followed during the patient's care in the ED.*   ____________________________________________  FINAL CLINICAL IMPRESSION(S) / ED DIAGNOSES  Final diagnoses:  Paranoid schizophrenia (HCC)     MEDICATIONS GIVEN DURING THIS VISIT:  Medications - No data to display   ED Discharge Orders    None       Note:  This document was prepared using Dragon voice recognition software and may include unintentional dictation errors.   Darci Current, MD 07/03/18 [phone]

## 2018-11-15 ENCOUNTER — Other Ambulatory Visit: Payer: Self-pay

## 2018-11-15 ENCOUNTER — Encounter: Payer: Self-pay | Admitting: Emergency Medicine

## 2018-11-15 DIAGNOSIS — F17210 Nicotine dependence, cigarettes, uncomplicated: Secondary | ICD-10-CM | POA: Insufficient documentation

## 2018-11-15 DIAGNOSIS — F209 Schizophrenia, unspecified: Secondary | ICD-10-CM | POA: Insufficient documentation

## 2018-11-15 DIAGNOSIS — F1410 Cocaine abuse, uncomplicated: Secondary | ICD-10-CM | POA: Insufficient documentation

## 2018-11-15 DIAGNOSIS — Z79899 Other long term (current) drug therapy: Secondary | ICD-10-CM | POA: Diagnosis not present

## 2018-11-15 LAB — CBC
HCT: 43.6 % (ref 39.0–52.0)
Hemoglobin: 14.7 g/dL (ref 13.0–17.0)
MCH: 31.3 pg (ref 26.0–34.0)
MCHC: 33.7 g/dL (ref 30.0–36.0)
MCV: 93 fL (ref 80.0–100.0)
Platelets: 252 10*3/uL (ref 150–400)
RBC: 4.69 MIL/uL (ref 4.22–5.81)
RDW: 14.1 % (ref 11.5–15.5)
WBC: 10.9 10*3/uL — ABNORMAL HIGH (ref 4.0–10.5)
nRBC: 0 % (ref 0.0–0.2)

## 2018-11-15 LAB — COMPREHENSIVE METABOLIC PANEL
ALT: 19 U/L (ref 0–44)
AST: 17 U/L (ref 15–41)
Albumin: 4 g/dL (ref 3.5–5.0)
Alkaline Phosphatase: 67 U/L (ref 38–126)
Anion gap: 6 (ref 5–15)
BUN: 16 mg/dL (ref 6–20)
CO2: 27 mmol/L (ref 22–32)
Calcium: 9.5 mg/dL (ref 8.9–10.3)
Chloride: 105 mmol/L (ref 98–111)
Creatinine, Ser: 0.85 mg/dL (ref 0.61–1.24)
GFR calc Af Amer: >60 mL/min (ref >60)
GFR calc non Af Amer: >60 mL/min (ref >60)
Glucose, Bld: 121 mg/dL — ABNORMAL HIGH (ref 70–99)
Potassium: 4.2 mmol/L (ref 3.5–5.1)
Sodium: 138 mmol/L (ref 135–145)
Total Bilirubin: 0.5 mg/dL (ref 0.3–1.2)
Total Protein: 7.1 g/dL (ref 6.5–8.1)

## 2018-11-15 LAB — URINE DRUG SCREEN, QUALITATIVE (ARMC ONLY)
Amphetamines, Ur Screen: NOT DETECTED
Barbiturates, Ur Screen: NOT DETECTED
Benzodiazepine, Ur Scrn: NOT DETECTED
Cannabinoid 50 Ng, Ur ~~LOC~~: NOT DETECTED
Cocaine Metabolite,Ur ~~LOC~~: NOT DETECTED
MDMA (Ecstasy)Ur Screen: NOT DETECTED
Methadone Scn, Ur: NOT DETECTED
Opiate, Ur Screen: NOT DETECTED
Phencyclidine (PCP) Ur S: NOT DETECTED
Tricyclic, Ur Screen: NOT DETECTED

## 2018-11-15 LAB — SALICYLATE LEVEL: Salicylate Lvl: <7 mg/dL (ref 2.8–30.0)

## 2018-11-15 LAB — ACETAMINOPHEN LEVEL: Acetaminophen (Tylenol), Serum: <10 ug/mL — ABNORMAL LOW (ref 10–30)

## 2018-11-15 NOTE — ED Triage Notes (Signed)
Patient to ER voluntarily for psych eval. Patient unwilling to give any further details, but denies SI.

## 2018-11-15 NOTE — ED Notes (Addendum)
Belongings: Hat, yellow chain, socks, boots, watch, watch with gold colored metal, black and multicolor stripe pants, black and multicolor jacket, black Tshirt, tan colored pants, navy long sleeve shirt, black belt, cell phone. Bubble pack of medications with 4 pills taken to quad to be stored in pharmacy.  Patient was unable to remove wedding band.

## 2018-11-16 ENCOUNTER — Emergency Department
Admission: EM | Admit: 2018-11-16 | Discharge: 2018-11-16 | Disposition: A | Discharge status: Home / Self Care | Payer: Medicaid Other | Attending: Emergency Medicine | Admitting: Emergency Medicine

## 2018-11-16 DIAGNOSIS — F209 Schizophrenia, unspecified: Secondary | ICD-10-CM

## 2018-11-16 DIAGNOSIS — F250 Schizoaffective disorder, bipolar type: Secondary | ICD-10-CM | POA: Diagnosis present

## 2018-11-16 DIAGNOSIS — Z9119 Patient's noncompliance with other medical treatment and regimen: Secondary | ICD-10-CM

## 2018-11-16 DIAGNOSIS — Z9114 Patient's other noncompliance with medication regimen: Secondary | ICD-10-CM

## 2018-11-16 DIAGNOSIS — F1910 Other psychoactive substance abuse, uncomplicated: Secondary | ICD-10-CM | POA: Diagnosis present

## 2018-11-16 DIAGNOSIS — F1420 Cocaine dependence, uncomplicated: Secondary | ICD-10-CM | POA: Diagnosis present

## 2018-11-16 DIAGNOSIS — Z91199 Patient's noncompliance with other medical treatment and regimen due to unspecified reason: Secondary | ICD-10-CM

## 2018-11-16 DIAGNOSIS — F1220 Cannabis dependence, uncomplicated: Secondary | ICD-10-CM | POA: Diagnosis present

## 2018-11-16 DIAGNOSIS — F29 Unspecified psychosis not due to a substance or known physiological condition: Secondary | ICD-10-CM | POA: Diagnosis present

## 2018-11-16 LAB — ETHANOL: Alcohol, Ethyl (B): <10 mg/dL (ref <10)

## 2018-11-16 NOTE — ED Provider Notes (Signed)
[facility] Emergency Department Provider Note  ____________________________________________  Time seen: Approximately 2:57 AM  I have reviewed the triage vital signs and the nursing notes.   HISTORY  Chief Complaint Psychiatric Evaluation   HPI Gregory Meza is a 53 y.o. male with a history of schizophrenia who presents from his group home for psychiatric evaluation.  Patient reports that he is here because he does not feel safe in his group home.  He does not like the  structure of his group home.  He reports that his legal guardian has not checked in him for a whole year.  Patient keeps saying "I am paranoid.".  He is unable to explain what that means.  He denies suicidal or homicidal thoughts.  He denies visual or auditory hallucinations.  He denies alcohol use.  Reports that he uses marijuana occasionally with the last one a month ago.  No other drug use.  Patient denies any medical complaints  Past Medical History:  Diagnosis Date  . Schizophrenia [facility])     Patient Active Problem List   Diagnosis Date Noted  . Psychoses ([place])   . Noncompliance with medication regimen   . Cocaine use disorder, moderate, dependence ([place]) 09/25/2014  . Cannabis use disorder, moderate, dependence ([place]) 09/25/2014  . Schizoaffective disorder, bipolar type ([place])   . Substance abuse ([place]) 03/29/2012    Class: Chronic  . Non-compliant patient 03/29/2012    Class: Chronic    History reviewed. No pertinent surgical history.  Prior to Admission medications   Medication Sig Start Date End Date Taking? Authorizing Provider  clonazePAM (KLONOPIN) 1 MG tablet Take 1 tablet (1 mg total) by mouth 2 (two) times daily. 02/08/15   Patrecia Pour, NP  Multiple Vitamin (MULTIVITAMIN WITH MINERALS) TABS tablet Take 1 tablet by mouth daily. 09/25/14   Niel Hummer, NP  OLANZapine (ZYPREXA) 15 MG tablet Take 1 tablet (15 mg total) by mouth 2 (two) times daily after a meal. 02/08/15    Patrecia Pour, NP  paliperidone (INVEGA SUSTENNA) 234 MG/1.5ML SUSP injection Inject 234 mg into the muscle once.    [provider]    Allergies Haldol [haloperidol] and Risperidone and related  No family history on file.  Social History Social History   Tobacco Use  . Smoking status: Current Every Day Smoker    Packs/day: 1.50    Years: 3.00    Pack years: 4.50    Types: Cigarettes  . Smokeless tobacco: Never Used  Substance Use Topics  . Alcohol use: Yes    Alcohol/week: 2.0 standard drinks    Types: 1 Glasses of wine, 1 Cans of beer per week  . Drug use: Yes    Frequency: 5.0 times per week    Types: "Crack" cocaine, Cocaine    Review of Systems  Constitutional: Negative for fever. Eyes: Negative for visual changes. ENT: Negative for sore throat. Neck: No neck pain  Cardiovascular: Negative for chest pain. Respiratory: Negative for shortness of breath. Gastrointestinal: Negative for abdominal pain, vomiting or diarrhea. Genitourinary: Negative for dysuria. Musculoskeletal: Negative for back pain. Skin: Negative for rash. Neurological: Negative for headaches, weakness or numbness. Psych: No SI or HI  ____________________________________________   PHYSICAL EXAM:  VITAL SIGNS: ED Triage Vitals  Enc Vitals Group     BP 11/15/18 2310 (!) 124/51     Pulse Rate 11/15/18 2310 78     Resp 11/15/18 2310 17     Temp 11/15/18 2310  98.7 F (37.1 C)     Temp Source 11/15/18 2310 Oral     SpO2 11/15/18 2310 96 %     Weight 11/15/18 2312 254 lb (115.2 kg)     Height 11/15/18 2312 6' (1.829 m)     Head Circumference --      Peak Flow --      Pain Score 11/15/18 2320 0     Pain Loc --      Pain Edu? --      Excl. in GC? --     Constitutional: Alert and oriented. Well appearing and in no apparent distress. HEENT:      Head: Normocephalic and atraumatic.         Eyes: Conjunctivae are normal. Sclera is non-icteric.       Mouth/Throat: Mucous  membranes are moist.       Neck: Supple with no signs of meningismus. Cardiovascular: Regular rate and rhythm.  Respiratory: Normal respiratory effort.  Gastrointestinal: Soft, non tender, and non distended. Musculoskeletal: No edema, cyanosis, or erythema of extremities. Neurologic: Normal speech and language. Face is symmetric. Moving all extremities. No gross focal neurologic deficits are appreciated. Skin: Skin is warm, dry and intact. No rash noted. Psychiatric: Mood and affect are normal. Speech and behavior are normal.  ____________________________________________   LABS (all labs ordered are listed, but only abnormal results are displayed)  Labs Reviewed  COMPREHENSIVE METABOLIC PANEL - Abnormal; Notable for the following components:      Result Value   Glucose, Bld 121 (*)    All other components within normal limits  ACETAMINOPHEN LEVEL - Abnormal; Notable for the following components:   Acetaminophen (Tylenol), Serum <10 (*)    All other components within normal limits  CBC - Abnormal; Notable for the following components:   WBC 10.9 (*)    All other components within normal limits  ETHANOL  SALICYLATE LEVEL  URINE DRUG SCREEN, QUALITATIVE ([facility] ONLY)   ____________________________________________  EKG  none  ____________________________________________  RADIOLOGY  none  ____________________________________________   PROCEDURES  Procedure(s) performed: None Procedures Critical Care performed:  None ____________________________________________   INITIAL IMPRESSION / ASSESSMENT AND PLAN / ED COURSE  53 y.o. male with a history of schizophrenia who presents from his group home for psychiatric evaluation.  Patient is unhappy in his group home and wishes to go to a different group home.  No SI or HI.  Patient does not meet criteria for IVC.  Labs for medical clearance with no significant abnormalities.  Will consult psychiatry       As part of my  medical decision making, I reviewed the following data within the electronic MEDICAL RECORD NUMBER Nursing notes reviewed and incorporated, Labs reviewed , Old chart reviewed, A consult was requested and obtained from this/these consultant(s) Psychiatry, Notes from prior ED visits and [place] Controlled Substance Database   Patient was evaluated in Emergency Department today for the symptoms described in the history of present illness. Patient was evaluated in the context of the global COVID-19 pandemic, which necessitated consideration that the patient might be at risk for infection with the SARS-CoV-2 virus that causes COVID-19. Institutional protocols and algorithms that pertain to the evaluation of patients at risk for COVID-19 are in a state of rapid change based on information released by regulatory bodies including the CDC and federal and state organizations. These policies and algorithms were followed during the patient's care in the ED.   ____________________________________________   FINAL CLINICAL IMPRESSION(S) /  ED DIAGNOSES   Final diagnoses:  Schizophrenia, unspecified type (HCC)      NEW MEDICATIONS STARTED DURING THIS VISIT:  ED Discharge Orders    None       Note:  This document was prepared using Dragon voice recognition software and may include unintentional dictation errors.    Nita SickleVeronese, [place], MD 11/16/18 [phone]

## 2018-11-16 NOTE — ED Notes (Signed)
Patient is here from because he doesn't want to be in his group home. He said the group home is not structured. He also said that his legal guardian ignored him for the whole year. He wants to be admitted to the hospital so he can get to another group home. He said he is paranoid schizophrenia and he is endorsing paranoia at the moment.  Pt. Oriented to Quad including Q15 minute rounds as well as Engineer, drilling for their protection. Patient is alert and oriented, warm and dry in no acute distress. Patient denies SI, HI, and AVH. Pt. Encouraged to let me know if needs arise.

## 2018-11-16 NOTE — ED Notes (Signed)
Hourly rounding reveals patient in room. No complaints, stable, in no acute distress. Q15 minute rounds and monitoring via Rover and Officer to continue.   

## 2018-11-16 NOTE — ED Notes (Signed)
Pt waiting for taxi to pick pt up.

## 2018-11-16 NOTE — ED Notes (Signed)
Pt given meal tray.

## 2018-11-16 NOTE — Consult Note (Signed)
[facility] Face-to[facility]Face Psychiat[facility] Consult   Reason for Consult: Psychiatric evaluation Referring Physician:  Dr. Don PerkingVeronese Patient Meza: Gregory Meza [place] MRN:  [id number] Principal Diagnosis: Schizoaffective disorder, bipolar type (HCC) Diagnosis:  Principal Problem:   Schizoaffective disorder, bipolar type (HCC) Active Problems:   Substance abuse (HCC)   Non-compliant patient   Cocaine use disorder, moderate, dependence (HCC)   Cannabis use disorder, moderate, dependence (HCC)   Noncompliance with medication regimen   Psychoses (HCC)   Total Time spent with patient: 45 minutes  Subjective: "I am here tonight because I want to become my own guardian and does not want to live in the group home anymore." Gregory Meza Windt is a 53 y.o. male patient [facility] via POV voluntary. The patient disclosed to this provider that he is here because "I do not want to be at my group home anymore."  He voiced, "I have a guardian that I have not talked to for over a year."  He states he is not happy with the service of his guardianship.  He voiced he is here because he wants to go to court and become his guardian.  The patient discussed, "I have a 52 year old grandson that I do not see, and I am not in his life."  He states I plan to become my own guardian and move in with my cousin to be involved in my grandson's life.  He says he has been at his group home for over a year and has been out on an outing twice.  He discussed once he was given the okay to leave, and the second time he went against the group home's order.  The patient voice, "I am paranoid."  He is trying to elaborate on what does he mean when he says he is paranoid.  He voiced, sometimes, "I feel like people are coming after me."  He states, "I know there is no one coming after me, but "I feel like people are trying to get me."  The patient was seen face-to-face by this provider; chart reviewed and consulted with Dr. Don PerkingVeronese on  11/16/2018 due to the patient's care. It was discussed with the EDP that the patient does not meet the criteria to be admitted to the psychiatric inpatient unit.  The patient is alert and oriented x 2-3, calm and cooperative, and mood-congruent with affect on evaluation.  The patient does admit to internal stimuli but denies external stimuli. Neither is the patient presenting with any delusional thinking. The patient denies auditory or visual hallucinations. The patient denies suicidal, homicidal, or self-harm ideations. The patient is presenting with some paranoid behaviors.  By stating, "I am paranoid."  He says, sometimes, "I feel like people are after me, but I know they are not."  During an encounter with the patient, he was able to answer questions appropriately and verbalize his wishes by wanting to see his grandson more often than he does now. Plan: The patient is not a safety risk to self or others and does not require psychiatric inpatient admission for stabilization and treatment.  HPI: Per Dr. Don PerkingVeronese; Gregory Meza Gregory Meza is a 53 y.o. male with a history of schizophrenia who presents from his group home for psychiatric evaluation.  Patient reports that he is here because he does not feel safe in his group home.  He does not like the structure of his group home.  He reports that his legal guardian has not checked in him for a whole year.  Patient keeps saying "I am paranoid.".  He is unable to explain what that means.  He denies suicidal or homicidal thoughts.  He denies visual or auditory hallucinations.  He denies alcohol use.  Reports that he uses marijuana occasionally with the last one a month ago.  No other drug use.  Patient denies any medical complaints  Past Psychiatric History:  Schizophrenia (HCC)  Risk to Self:  No Risk to Others:  No Prior Inpatient Therapy:  Yes Prior Outpatient Therapy:  Yes  Past Medical History:  Past Medical History:  Diagnosis Date  . Schizophrenia (HCC)     History reviewed. No pertinent surgical history. Family History: No family history on file. Family Psychiatric  History: Unknown Social History:  Social History   Substance and Sexual Activity  Alcohol Use Yes  . Alcohol/week: 2.0 standard drinks  . Types: 1 Glasses of wine, 1 Cans of beer per week     Social History   Substance and Sexual Activity  Drug Use Yes  . Frequency: 5.0 times per week  . Types: "Crack" cocaine, Cocaine    Social History   Socioeconomic History  . Marital status: Single    Spouse name: Not on file  . Number of children: Not on file  . Years of education: Not on file  . Highest education level: Not on file  Occupational History  . Not on file  Social Needs  . Financial resource strain: Not on file  . Food insecurity    Worry: Not on file    Inability: Not on file  . Transportation needs    Medical: Not on file    Non-medical: Not on file  Tobacco Use  . Smoking status: Current Every Day Smoker    Packs/day: 1.50    Years: 3.00    Pack years: 4.50    Types: Cigarettes  . Smokeless tobacco: Never Used  Substance and Sexual Activity  . Alcohol use: Yes    Alcohol/week: 2.0 standard drinks    Types: 1 Glasses of wine, 1 Cans of beer per week  . Drug use: Yes    Frequency: 5.0 times per week    Types: "Crack" cocaine, Cocaine  . Sexual activity: Not Currently  Lifestyle  . Physical activity    Days per week: Not on file    Minutes per session: Not on file  . Stress: Not on file  Relationships  . Social Musician on phone: Not on file    Gets together: Not on file    Attends religious service: Not on file    Active member of club or organization: Not on file    Attends meetings of clubs or organizations: Not on file    Relationship status: Not on file  Other Topics Concern  . Not on file  Social History Narrative  . Not on file   Additional Social History:    Allergies:   Allergies  Allergen Reactions  .  Haldol [Haloperidol]     Pt reports he is allergic to injectable and PO haldol  . Risperidone And Related     Pt reports he is allergic to injectable and PO     Labs:  Results for orders placed or performed during the hospital encounter of 11/16/18 (from the past 48 hour(s))  Comprehensive metabolic panel     Status: Abnormal   Collection Time: 11/15/18 11:13 PM  Result Value Ref Range   Sodium 138 135 - 145 mmol/L  Potassium 4.2 3.5 - 5.1 mmol/L   Chloride 105 98 - 111 mmol/L   CO2 27 22 - 32 mmol/L   Glucose, Bld 121 (H) 70 - 99 mg/dL   BUN 16 6 - 20 mg/dL   Creatinine, Ser 4.68 0.61 - 1.24 mg/dL   Calcium 9.5 8.9 - 03.2 mg/dL   Total Protein 7.1 6.5 - 8.1 g/dL   Albumin 4.0 3.5 - 5.0 g/dL   AST 17 15 - 41 U/L   ALT 19 0 - 44 U/L   Alkaline Phosphatase 67 38 - 126 U/L   Total Bilirubin 0.5 0.3 - 1.2 mg/dL   GFR calc non Af Amer >60 >60 mL/min   GFR calc Af Amer >60 >60 mL/min   Anion gap 6 5 - 15    Comment: Performed at [facility], [address], [place], [place] [address]  Ethanol     Status: None   Collection Time: 11/15/18 11:13 PM  Result Value Ref Range   Alcohol, Ethyl (B) <10 <10 mg/dL    Comment: (NOTE) Lowest detectable limit for serum alcohol is 10 mg/dL. For medical purposes only. Performed at [facility], [address] [place], [place] [address]   Salicylate level     Status: None   Collection Time: 11/15/18 11:13 PM  Result Value Ref Range   Salicylate Lvl <7.0 2.8 - 30.0 mg/dL    Comment: Performed at [facility], [address] [place], [place] [address]  Acetaminophen level     Status: Abnormal   Collection Time: 11/15/18 11:13 PM  Result Value Ref Range   Acetaminophen (Tylenol), Serum <10 (L) 10 - 30 ug/mL    Comment: (NOTE) Therapeutic concentrations vary significantly. A range of 10-30 ug/mL  may be an effective concentration for many patients. However, some  are best treated at concentrations outside of  this range. Acetaminophen concentrations >150 ug/mL at 4 hours after ingestion  and >50 ug/mL at 12 hours after ingestion are often associated with  toxic reactions. Performed at [facility], [address] [place], [place] [address]   cbc     Status: Abnormal   Collection Time: 11/15/18 11:13 PM  Result Value Ref Range   WBC 10.9 (H) 4.0 - 10.5 K/uL   RBC 4.69 4.22 - 5.81 MIL/uL   Hemoglobin 14.7 13.0 - 17.0 g/dL   HCT 45.0 38.8 - 82.8 %   MCV 93.0 80.0 - 100.0 fL   MCH 31.3 26.0 - 34.0 pg   MCHC 33.7 30.0 - 36.0 g/dL   RDW 00.3 49.1 - 79.1 %   Platelets 252 150 - 400 K/uL   nRBC 0.0 0.0 - 0.2 %    Comment: Performed at [facility] [address] [place], [place] [address]  Urine Drug Screen, Qualitative     Status: None   Collection Time: 11/15/18 11:13 PM  Result Value Ref Range   Tricyclic, Ur Screen NONE DETECTED NONE DETECTED   Amphetamines, Ur Screen NONE DETECTED NONE DETECTED   MDMA (Ecstasy)Ur Screen NONE DETECTED NONE DETECTED   Cocaine Metabolite,Ur [place] NONE DETECTED NONE DETECTED   Opiate, Ur Screen NONE DETECTED NONE DETECTED   Phencyclidine (PCP) Ur S NONE DETECTED NONE DETECTED   Cannabinoid 50 Ng, Ur [place] NONE DETECTED NONE DETECTED   Barbiturates, Ur Screen NONE DETECTED NONE DETECTED   Benzodiazepine, Ur Scrn NONE DETECTED NONE DETECTED   Methadone Scn, Ur NONE DETECTED NONE DETECTED    Comment: (NOTE)  Tricyclics + metabolites, urine    Cutoff 1000 ng/mL Amphetamines + metabolites, urine  Cutoff 1000 ng/mL MDMA (Ecstasy), urine              Cutoff 500 ng/mL Cocaine Metabolite, urine          Cutoff 300 ng/mL Opiate + metabolites, urine        Cutoff 300 ng/mL Phencyclidine (PCP), urine         Cutoff 25 ng/mL Cannabinoid, urine                 Cutoff 50 ng/mL Barbiturates + metabolites, urine  Cutoff 200 ng/mL Benzodiazepine, urine              Cutoff 200 ng/mL Methadone, urine                   Cutoff 300 ng/mL The urine drug  screen provides only a preliminary, unconfirmed analytical test result and should not be used for non-medical purposes. Clinical consideration and professional judgment should be applied to any positive drug screen result due to possible interfering substances. A more specific alternate chemical method must be used in order to obtain a confirmed analytical result. Gas chromatography / mass spectrometry (GC/MS) is the preferred confirmat ory method. Performed at [facility], [address] [place], [place] [address]     No current facility-administered medications for this encounter.    Current Outpatient Medications  Medication Sig Dispense Refill  . clonazePAM (KLONOPIN) 1 MG tablet Take 1 tablet (1 mg total) by mouth 2 (two) times daily. 30 tablet 0  . Multiple Vitamin (MULTIVITAMIN WITH MINERALS) TABS tablet Take 1 tablet by mouth daily.    Marland Kitchen OLANZapine (ZYPREXA) 15 MG tablet Take 1 tablet (15 mg total) by mouth 2 (two) times daily after a meal. 60 tablet 0  . paliperidone (INVEGA SUSTENNA) 234 MG/1.5ML SUSP injection Inject 234 mg into the muscle once.      Musculoskeletal: Strength & Muscle Tone: within normal limits Gait & Station: normal Patient leans: N/A  Psychiatric Specialty Exam: Physical Exam  Nursing note and vitals reviewed. Constitutional: He is oriented to person, place, and time. He appears well-developed.  Neck: Normal range of motion. Neck supple.  Cardiovascular: Normal rate.  Respiratory: Effort normal.  Musculoskeletal: Normal range of motion.  Neurological: He is alert and oriented to person, place, and time.  Psychiatric: He has a normal mood and affect. His behavior is normal.    Review of Systems  Psychiatric/Behavioral: Positive for depression. The patient is nervous/anxious.   All other systems reviewed and are negative.   Blood pressure (!) 124/51, pulse 78, temperature 98.7 F (37.1 C), temperature source Oral, resp. rate 17,  height 6' (1.829 m), weight 115.2 kg, SpO2 96 %.Body mass index is 34.45 kg/m.  General Appearance: Casual  Eye Contact:  Fair  Speech:  Clear and Coherent  Volume:  Normal  Mood:  Anxious and Depressed  Affect:  Congruent, Depressed and Flat  Thought Process:  Coherent and Goal Directed  Orientation:  Full (Time, Place, and Person)  Thought Content:  WDL and Logical  Suicidal Thoughts:  No  Homicidal Thoughts:  No  Memory:  Immediate;   Good Recent;   Good Remote;   Good  Judgement:  Intact  Insight:  Fair  Psychomotor Activity:  Normal  Concentration:  Concentration: Good and Attention Span: Good  Recall:  Good  Fund of Knowledge:  Good  Language:  Good  Akathisia:  Negative  Handed:  Right  AIMS (if indicated):     Assets:  Communication Skills Desire for Improvement Housing Social Support  ADL's:  Intact  Cognition:  WNL  Sleep:   Good     Treatment Plan Summary: Daily contact with patient to assess and evaluate symptoms and progress in treatment, Medication management and Plan The patient does not meet criteria for psychiatric inpatient admission.  Disposition: No evidence of imminent risk to self or others at present.   Patient does not meet criteria for psychiatric inpatient admission. Supportive therapy provided about ongoing stressors.  Gillermo Murdoch, NP 11/16/2018 4:44 AM

## 2018-11-16 NOTE — ED Provider Notes (Signed)
Procedures     ----------------------------------------- 12:37 PM on 11/16/2018 -----------------------------------------   D/w psychiatry who finds pt to be psychiatrically stable. Med. Stable for DC back to group home.    Carrie Mew, MD 11/16/18 [phone]

## 2018-11-16 NOTE — ED Notes (Signed)
Pt signed for his d/c paperwork and group home owner verbalized understanding. Legal Guardian not answering any of multiple phone numbers, personal, emergency, or otherwise at this time.

## 2018-11-16 NOTE — ED Notes (Signed)
[phone] called as given to this RN by pt for group home number. Phone answered and stated that phone number is for a different group home but pt's group home is across the street. Man answering phone stated that he will call this RN back.

## 2018-11-16 NOTE — ED Notes (Signed)
Group home number added to contacts. Bland Span from group home going to try to get transportation.

## 2018-11-16 NOTE — ED Notes (Signed)
Snack and beverage given. 

## 2019-02-19 ENCOUNTER — Other Ambulatory Visit: Payer: Self-pay

## 2019-02-19 ENCOUNTER — Emergency Department
Admission: EM | Admit: 2019-02-19 | Discharge: 2019-02-20 | Disposition: A | Discharge status: Other Acute Inpatient Hospital | Payer: Medicaid Other | Attending: Emergency Medicine | Admitting: Emergency Medicine

## 2019-02-19 DIAGNOSIS — Z20822 Contact with and (suspected) exposure to covid-19: Secondary | ICD-10-CM | POA: Insufficient documentation

## 2019-02-19 DIAGNOSIS — F209 Schizophrenia, unspecified: Secondary | ICD-10-CM | POA: Diagnosis present

## 2019-02-19 DIAGNOSIS — Z046 Encounter for general psychiatric examination, requested by authority: Secondary | ICD-10-CM

## 2019-02-19 DIAGNOSIS — F250 Schizoaffective disorder, bipolar type: Secondary | ICD-10-CM | POA: Diagnosis present

## 2019-02-19 DIAGNOSIS — F17210 Nicotine dependence, cigarettes, uncomplicated: Secondary | ICD-10-CM | POA: Diagnosis not present

## 2019-02-19 DIAGNOSIS — R4689 Other symptoms and signs involving appearance and behavior: Secondary | ICD-10-CM

## 2019-02-19 DIAGNOSIS — F259 Schizoaffective disorder, unspecified: Secondary | ICD-10-CM

## 2019-02-19 DIAGNOSIS — Z9114 Patient's other noncompliance with medication regimen: Secondary | ICD-10-CM

## 2019-02-19 LAB — COMPREHENSIVE METABOLIC PANEL
ALT: 22 U/L (ref 0–44)
AST: 22 U/L (ref 15–41)
Albumin: 4.3 g/dL (ref 3.5–5.0)
Alkaline Phosphatase: 69 U/L (ref 38–126)
Anion gap: 7 (ref 5–15)
BUN: 10 mg/dL (ref 6–20)
CO2: 24 mmol/L (ref 22–32)
Calcium: 9 mg/dL (ref 8.9–10.3)
Chloride: 107 mmol/L (ref 98–111)
Creatinine, Ser: 0.76 mg/dL (ref 0.61–1.24)
GFR calc Af Amer: >60 mL/min (ref >60)
GFR calc non Af Amer: >60 mL/min (ref >60)
Glucose, Bld: 172 mg/dL — ABNORMAL HIGH (ref 70–99)
Potassium: 3.8 mmol/L (ref 3.5–5.1)
Sodium: 138 mmol/L (ref 135–145)
Total Bilirubin: 0.6 mg/dL (ref 0.3–1.2)
Total Protein: 7.5 g/dL (ref 6.5–8.1)

## 2019-02-19 LAB — RESPIRATORY PANEL BY RT PCR (FLU A&B, COVID)
Influenza A by PCR: NEGATIVE
Influenza B by PCR: NEGATIVE
SARS Coronavirus 2 by RT PCR: NEGATIVE

## 2019-02-19 LAB — ETHANOL: Alcohol, Ethyl (B): <10 mg/dL (ref <10)

## 2019-02-19 LAB — CBC
HCT: 43.6 % (ref 39.0–52.0)
Hemoglobin: 15.6 g/dL (ref 13.0–17.0)
MCH: 30.9 pg (ref 26.0–34.0)
MCHC: 35.8 g/dL (ref 30.0–36.0)
MCV: 86.3 fL (ref 80.0–100.0)
Platelets: 241 10*3/uL (ref 150–400)
RBC: 5.05 MIL/uL (ref 4.22–5.81)
RDW: 14.1 % (ref 11.5–15.5)
WBC: 9 10*3/uL (ref 4.0–10.5)
nRBC: 0 % (ref 0.0–0.2)

## 2019-02-19 LAB — SALICYLATE LEVEL: Salicylate Lvl: <7 mg/dL — ABNORMAL LOW (ref 7.0–30.0)

## 2019-02-19 LAB — ACETAMINOPHEN LEVEL: Acetaminophen (Tylenol), Serum: <10 ug/mL — ABNORMAL LOW (ref 10–30)

## 2019-02-19 MED ORDER — LORAZEPAM 2 MG PO TABS: 2.0000 mg | ORAL_TABLET | Freq: Once | ORAL | Status: DC | PRN

## 2019-02-19 MED ORDER — DIPHENHYDRAMINE HCL 25 MG PO CAPS: 50.0000 mg | ORAL_CAPSULE | Freq: Once | ORAL | Status: DC | PRN

## 2019-02-19 MED ORDER — ZIPRASIDONE MESYLATE 20 MG IM SOLR: 20.0000 mg | Freq: Once | INTRAMUSCULAR | Status: DC | PRN

## 2019-02-19 MED ORDER — PALIPERIDONE ER 3 MG PO TB24
12.0000 mg | ORAL_TABLET | Freq: Every day | ORAL | Status: DC
  Administered 2019-02-19 – 2019-02-20 (×2): 12 mg via ORAL
  Filled 2019-02-19 (×3): qty 4

## 2019-02-19 MED ORDER — NICOTINE 14 MG/24HR TD PT24
14.0000 mg | MEDICATED_PATCH | Freq: Once | TRANSDERMAL | Status: DC
  Filled 2019-02-19: qty 1
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
## 2019-02-19 NOTE — ED Notes (Addendum)
Pt belongings: black leather jacket, white tshirt, red pj pants, black shoes, pair of white socks,   Belongings placed in 2 bags.   Dress out by this Charity fundraiser and Gerilyn Pilgrim EDT.

## 2019-02-19 NOTE — ED Notes (Signed)
First Nurse Note: Pt to ED via [facility] PD under IVC. Pt is cooperative at this time.

## 2019-02-19 NOTE — ED Notes (Signed)
Dinner tray provided

## 2019-02-19 NOTE — ED Notes (Signed)
Referral information for Psychiatric Hospitalization faxed to;   [facility] [phone]),   . [facility] [phone]- [phone]) [phone]  . [place] ([phone]),  . [facility] [phone], [phone], [phone] or [phone]),   . [place] [phone] or [phone])  . [facility] [phone]),   . [facility] [phone] -or- [phone]),
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
## 2019-02-19 NOTE — ED Notes (Signed)
ER Md at bedside to evaulate and clear for psych.
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
## 2019-02-19 NOTE — ED Notes (Signed)
IVC, pending consult 

## 2019-02-20 NOTE — ED Notes (Signed)
RN attempted to contact [facility] DSS guardian Gregory Meza [phone]).  Message left requesting return phone call.

## 2019-02-20 NOTE — ED Provider Notes (Signed)
Patient accepted to [facility]   Jene Every, MD 02/20/19 1008

## 2019-02-20 NOTE — ED Notes (Addendum)
Patient has been accepted to [facility].  Patient assigned to room [facility] Accepting physician is Dr. Betti Cruz.  Call report to [phone].  Representative was [employer].   ER Staff is aware of it:  [facility] ER Secretary  Dr. Erma Heritage, ER MD  Dewayne Hatch Patient's Nurse   Patient is to be taken to the [facility] building upon arrival for testing.

## 2019-02-20 NOTE — ED Notes (Signed)
This RN spoke with Tiburcio Bash from [facility] who is reviewing clinical information on pt. Would like patients blood pressure to be better before accepting pt. Will inform ED MD.

## 2019-02-20 NOTE — ED Notes (Signed)
Pt awake asking about leaving. Redirected back to room by officer and went back to bed.

## 2019-02-20 NOTE — ED Notes (Signed)
Pt discharged under IVC to [facility].  VS stable. Pt accepting and cooperative. Belongings given to officer.  RN left message for legal guardian. Pt denies SI/HI.
